# Patient Record
Sex: Male | Born: 1935
Health system: Southern US, Community
[De-identification: ages and names within clinical notes are randomized; demographics above are authoritative.]

## PROBLEM LIST (undated history)

## (undated) DIAGNOSIS — R972 Elevated prostate specific antigen [PSA]: Secondary | ICD-10-CM

## (undated) DIAGNOSIS — K5730 Diverticulosis of large intestine without perforation or abscess without bleeding: Secondary | ICD-10-CM

## (undated) DIAGNOSIS — N189 Chronic kidney disease, unspecified: Secondary | ICD-10-CM

## (undated) DIAGNOSIS — Z860100 Personal history of colon polyps, unspecified: Secondary | ICD-10-CM

## (undated) DIAGNOSIS — Z87442 Personal history of urinary calculi: Secondary | ICD-10-CM

## (undated) DIAGNOSIS — E785 Hyperlipidemia, unspecified: Secondary | ICD-10-CM

## (undated) DIAGNOSIS — C4491 Basal cell carcinoma of skin, unspecified: Secondary | ICD-10-CM

## (undated) DIAGNOSIS — I10 Essential (primary) hypertension: Secondary | ICD-10-CM

## (undated) DIAGNOSIS — C439 Malignant melanoma of skin, unspecified: Secondary | ICD-10-CM

## (undated) DIAGNOSIS — N400 Enlarged prostate without lower urinary tract symptoms: Secondary | ICD-10-CM

## (undated) HISTORY — PX: COLONOSCOPY W/ POLYPECTOMY: SHX1380

## (undated) HISTORY — DX: Personal history of colon polyps, unspecified: Z86.0100

## (undated) HISTORY — PX: OTHER SURGICAL HISTORY: SHX169

## (undated) HISTORY — DX: Malignant melanoma of skin, unspecified: C43.9

## (undated) HISTORY — DX: Hyperlipidemia, unspecified: E78.5

## (undated) HISTORY — DX: Personal history of colonic polyps: Z86.010

## (undated) HISTORY — DX: Elevated prostate specific antigen (PSA): R97.20

## (undated) HISTORY — DX: Diverticulosis of large intestine without perforation or abscess without bleeding: K57.30

## (undated) HISTORY — DX: Basal cell carcinoma of skin, unspecified: C44.91

## (undated) HISTORY — DX: Essential (primary) hypertension: I10

## (undated) HISTORY — DX: Benign prostatic hyperplasia without lower urinary tract symptoms: N40.0

---

## 1997-11-14 ENCOUNTER — Inpatient Hospital Stay (HOSPITAL_COMMUNITY): Admission: EM | Admit: 1997-11-14 | Discharge: 1997-11-16 | Payer: Self-pay | Admitting: *Deleted

## 1999-02-12 ENCOUNTER — Encounter: Payer: Self-pay | Admitting: Urology

## 1999-02-12 ENCOUNTER — Ambulatory Visit (HOSPITAL_COMMUNITY): Admission: RE | Admit: 1999-02-12 | Discharge: 1999-02-12 | Payer: Self-pay | Admitting: Urology

## 1999-02-12 ENCOUNTER — Encounter (INDEPENDENT_AMBULATORY_CARE_PROVIDER_SITE_OTHER): Payer: Self-pay | Admitting: Specialist

## 2000-07-28 ENCOUNTER — Encounter: Payer: Self-pay | Admitting: Oral Surgery

## 2000-08-01 ENCOUNTER — Encounter (INDEPENDENT_AMBULATORY_CARE_PROVIDER_SITE_OTHER): Payer: Self-pay | Admitting: *Deleted

## 2000-08-01 ENCOUNTER — Ambulatory Visit (HOSPITAL_COMMUNITY): Admission: RE | Admit: 2000-08-01 | Discharge: 2000-08-01 | Payer: Self-pay | Admitting: Oral Surgery

## 2000-09-18 ENCOUNTER — Ambulatory Visit (HOSPITAL_COMMUNITY): Admission: RE | Admit: 2000-09-18 | Discharge: 2000-09-18 | Payer: Self-pay | Admitting: Gastroenterology

## 2001-08-21 ENCOUNTER — Encounter: Payer: Self-pay | Admitting: Orthopedic Surgery

## 2001-08-21 ENCOUNTER — Encounter: Admission: RE | Admit: 2001-08-21 | Discharge: 2001-08-21 | Payer: Self-pay | Admitting: Orthopedic Surgery

## 2002-09-27 ENCOUNTER — Encounter: Admission: RE | Admit: 2002-09-27 | Discharge: 2002-09-27 | Payer: Self-pay | Admitting: Internal Medicine

## 2002-09-27 ENCOUNTER — Encounter: Payer: Self-pay | Admitting: Internal Medicine

## 2002-10-11 ENCOUNTER — Encounter: Payer: Self-pay | Admitting: Internal Medicine

## 2002-10-11 ENCOUNTER — Encounter: Admission: RE | Admit: 2002-10-11 | Discharge: 2002-10-11 | Payer: Self-pay | Admitting: Internal Medicine

## 2003-04-23 ENCOUNTER — Encounter: Payer: Self-pay | Admitting: Neurological Surgery

## 2003-04-23 ENCOUNTER — Ambulatory Visit (HOSPITAL_COMMUNITY): Admission: RE | Admit: 2003-04-23 | Discharge: 2003-04-23 | Payer: Self-pay | Admitting: Neurological Surgery

## 2003-05-12 ENCOUNTER — Encounter: Payer: Self-pay | Admitting: Diagnostic Radiology

## 2003-05-12 ENCOUNTER — Encounter: Admission: RE | Admit: 2003-05-12 | Discharge: 2003-05-12 | Payer: Self-pay | Admitting: Neurological Surgery

## 2003-05-12 ENCOUNTER — Encounter: Payer: Self-pay | Admitting: Neurological Surgery

## 2004-01-28 ENCOUNTER — Encounter: Admission: RE | Admit: 2004-01-28 | Discharge: 2004-01-28 | Payer: Self-pay | Admitting: Neurological Surgery

## 2004-03-01 ENCOUNTER — Encounter: Payer: Self-pay | Admitting: Internal Medicine

## 2004-03-04 ENCOUNTER — Encounter: Admission: RE | Admit: 2004-03-04 | Discharge: 2004-03-04 | Payer: Self-pay | Admitting: Neurological Surgery

## 2004-08-03 ENCOUNTER — Encounter: Admission: RE | Admit: 2004-08-03 | Discharge: 2004-08-03 | Payer: Self-pay | Admitting: Neurological Surgery

## 2004-10-18 ENCOUNTER — Ambulatory Visit: Payer: Self-pay | Admitting: Internal Medicine

## 2004-10-20 ENCOUNTER — Ambulatory Visit (HOSPITAL_COMMUNITY): Admission: RE | Admit: 2004-10-20 | Discharge: 2004-10-20 | Payer: Self-pay | Admitting: Neurological Surgery

## 2005-11-23 ENCOUNTER — Ambulatory Visit: Payer: Self-pay | Admitting: Internal Medicine

## 2006-02-28 ENCOUNTER — Encounter: Admission: RE | Admit: 2006-02-28 | Discharge: 2006-02-28 | Payer: Self-pay | Admitting: Neurological Surgery

## 2006-12-04 ENCOUNTER — Ambulatory Visit: Payer: Self-pay | Admitting: Internal Medicine

## 2006-12-04 LAB — CONVERTED CEMR LAB
AST: 21 units/L (ref 0–37)
BUN: 14 mg/dL (ref 6–23)
PSA: 2.25 ng/mL (ref 0.10–4.00)
Total CHOL/HDL Ratio: 3.1
Triglycerides: 75 mg/dL (ref 0–149)
VLDL: 15 mg/dL (ref 0–40)

## 2006-12-22 ENCOUNTER — Ambulatory Visit: Payer: Self-pay | Admitting: Internal Medicine

## 2007-03-19 ENCOUNTER — Telehealth (INDEPENDENT_AMBULATORY_CARE_PROVIDER_SITE_OTHER): Payer: Self-pay | Admitting: *Deleted

## 2007-03-26 ENCOUNTER — Ambulatory Visit: Payer: Self-pay | Admitting: Internal Medicine

## 2007-07-09 ENCOUNTER — Ambulatory Visit: Payer: Self-pay | Admitting: Internal Medicine

## 2007-07-10 ENCOUNTER — Encounter (INDEPENDENT_AMBULATORY_CARE_PROVIDER_SITE_OTHER): Payer: Self-pay | Admitting: *Deleted

## 2007-07-10 LAB — CONVERTED CEMR LAB
Amylase: 88 units/L (ref 27–131)
Basophils Absolute: 0 10*3/uL (ref 0.0–0.1)
Bilirubin, Direct: 0.1 mg/dL (ref 0.0–0.3)
Eosinophils Absolute: 0.2 10*3/uL (ref 0.0–0.6)
Eosinophils Relative: 2.3 % (ref 0.0–5.0)
Lipase: 33 units/L (ref 11.0–59.0)
MCV: 91.1 fL (ref 78.0–100.0)
Platelets: 207 10*3/uL (ref 150–400)
RBC: 4.6 M/uL (ref 4.22–5.81)
Total Bilirubin: 1 mg/dL (ref 0.3–1.2)
WBC: 6.8 10*3/uL (ref 4.5–10.5)

## 2007-07-17 ENCOUNTER — Encounter (INDEPENDENT_AMBULATORY_CARE_PROVIDER_SITE_OTHER): Payer: Self-pay | Admitting: *Deleted

## 2007-07-17 ENCOUNTER — Ambulatory Visit: Payer: Self-pay | Admitting: Internal Medicine

## 2007-07-19 ENCOUNTER — Telehealth (INDEPENDENT_AMBULATORY_CARE_PROVIDER_SITE_OTHER): Payer: Self-pay | Admitting: *Deleted

## 2007-08-02 ENCOUNTER — Encounter: Payer: Self-pay | Admitting: Internal Medicine

## 2007-11-09 ENCOUNTER — Encounter: Payer: Self-pay | Admitting: Internal Medicine

## 2007-11-16 ENCOUNTER — Encounter: Admission: RE | Admit: 2007-11-16 | Discharge: 2007-11-16 | Payer: Self-pay | Admitting: Gastroenterology

## 2007-11-19 ENCOUNTER — Encounter: Payer: Self-pay | Admitting: Internal Medicine

## 2008-05-19 ENCOUNTER — Telehealth (INDEPENDENT_AMBULATORY_CARE_PROVIDER_SITE_OTHER): Payer: Self-pay | Admitting: *Deleted

## 2008-05-27 ENCOUNTER — Telehealth (INDEPENDENT_AMBULATORY_CARE_PROVIDER_SITE_OTHER): Payer: Self-pay | Admitting: *Deleted

## 2008-06-02 ENCOUNTER — Ambulatory Visit: Payer: Self-pay | Admitting: Internal Medicine

## 2008-06-02 DIAGNOSIS — N400 Enlarged prostate without lower urinary tract symptoms: Secondary | ICD-10-CM | POA: Insufficient documentation

## 2008-06-04 ENCOUNTER — Encounter (INDEPENDENT_AMBULATORY_CARE_PROVIDER_SITE_OTHER): Payer: Self-pay | Admitting: *Deleted

## 2008-09-01 ENCOUNTER — Ambulatory Visit: Payer: Self-pay | Admitting: Internal Medicine

## 2008-09-07 LAB — CONVERTED CEMR LAB: PSA: 1.6 ng/mL (ref 0.10–4.00)

## 2008-09-08 ENCOUNTER — Ambulatory Visit: Payer: Self-pay | Admitting: Internal Medicine

## 2008-09-08 ENCOUNTER — Encounter (INDEPENDENT_AMBULATORY_CARE_PROVIDER_SITE_OTHER): Payer: Self-pay | Admitting: *Deleted

## 2008-09-08 DIAGNOSIS — R972 Elevated prostate specific antigen [PSA]: Secondary | ICD-10-CM | POA: Insufficient documentation

## 2008-09-09 ENCOUNTER — Ambulatory Visit: Payer: Self-pay | Admitting: Internal Medicine

## 2008-09-10 ENCOUNTER — Encounter (INDEPENDENT_AMBULATORY_CARE_PROVIDER_SITE_OTHER): Payer: Self-pay | Admitting: *Deleted

## 2008-10-02 ENCOUNTER — Ambulatory Visit: Payer: Self-pay | Admitting: Internal Medicine

## 2008-10-02 DIAGNOSIS — E785 Hyperlipidemia, unspecified: Secondary | ICD-10-CM

## 2008-10-02 DIAGNOSIS — Z85828 Personal history of other malignant neoplasm of skin: Secondary | ICD-10-CM

## 2008-10-02 DIAGNOSIS — IMO0002 Reserved for concepts with insufficient information to code with codable children: Secondary | ICD-10-CM | POA: Insufficient documentation

## 2008-10-02 DIAGNOSIS — H532 Diplopia: Secondary | ICD-10-CM

## 2008-10-02 DIAGNOSIS — K5730 Diverticulosis of large intestine without perforation or abscess without bleeding: Secondary | ICD-10-CM | POA: Insufficient documentation

## 2008-10-02 DIAGNOSIS — Z86010 Personal history of colonic polyps: Secondary | ICD-10-CM

## 2008-10-02 DIAGNOSIS — Z85820 Personal history of malignant melanoma of skin: Secondary | ICD-10-CM

## 2008-10-02 DIAGNOSIS — G250 Essential tremor: Secondary | ICD-10-CM | POA: Insufficient documentation

## 2008-10-03 ENCOUNTER — Encounter: Payer: Self-pay | Admitting: Internal Medicine

## 2008-10-08 ENCOUNTER — Telehealth: Payer: Self-pay | Admitting: Internal Medicine

## 2008-10-10 ENCOUNTER — Ambulatory Visit: Payer: Self-pay | Admitting: Internal Medicine

## 2008-10-13 ENCOUNTER — Telehealth (INDEPENDENT_AMBULATORY_CARE_PROVIDER_SITE_OTHER): Payer: Self-pay | Admitting: *Deleted

## 2008-10-13 ENCOUNTER — Encounter (INDEPENDENT_AMBULATORY_CARE_PROVIDER_SITE_OTHER): Payer: Self-pay | Admitting: *Deleted

## 2008-10-14 ENCOUNTER — Telehealth (INDEPENDENT_AMBULATORY_CARE_PROVIDER_SITE_OTHER): Payer: Self-pay | Admitting: *Deleted

## 2008-10-23 ENCOUNTER — Ambulatory Visit: Payer: Self-pay | Admitting: Internal Medicine

## 2008-10-23 DIAGNOSIS — R7303 Prediabetes: Secondary | ICD-10-CM

## 2008-11-18 ENCOUNTER — Ambulatory Visit: Payer: Self-pay | Admitting: Internal Medicine

## 2008-11-19 ENCOUNTER — Encounter (INDEPENDENT_AMBULATORY_CARE_PROVIDER_SITE_OTHER): Payer: Self-pay | Admitting: *Deleted

## 2008-12-01 ENCOUNTER — Ambulatory Visit: Payer: Self-pay | Admitting: Internal Medicine

## 2008-12-03 ENCOUNTER — Encounter (INDEPENDENT_AMBULATORY_CARE_PROVIDER_SITE_OTHER): Payer: Self-pay | Admitting: *Deleted

## 2008-12-03 LAB — CONVERTED CEMR LAB: TSH: 2.3 microintl units/mL (ref 0.35–5.50)

## 2008-12-08 ENCOUNTER — Encounter (INDEPENDENT_AMBULATORY_CARE_PROVIDER_SITE_OTHER): Payer: Self-pay | Admitting: *Deleted

## 2008-12-09 ENCOUNTER — Telehealth (INDEPENDENT_AMBULATORY_CARE_PROVIDER_SITE_OTHER): Payer: Self-pay | Admitting: *Deleted

## 2009-01-16 ENCOUNTER — Telehealth (INDEPENDENT_AMBULATORY_CARE_PROVIDER_SITE_OTHER): Payer: Self-pay | Admitting: *Deleted

## 2009-02-24 ENCOUNTER — Ambulatory Visit: Payer: Self-pay | Admitting: Internal Medicine

## 2009-03-31 ENCOUNTER — Telehealth (INDEPENDENT_AMBULATORY_CARE_PROVIDER_SITE_OTHER): Payer: Self-pay | Admitting: *Deleted

## 2009-05-15 ENCOUNTER — Telehealth (INDEPENDENT_AMBULATORY_CARE_PROVIDER_SITE_OTHER): Payer: Self-pay | Admitting: *Deleted

## 2009-06-11 ENCOUNTER — Telehealth (INDEPENDENT_AMBULATORY_CARE_PROVIDER_SITE_OTHER): Payer: Self-pay | Admitting: *Deleted

## 2009-07-06 ENCOUNTER — Ambulatory Visit: Payer: Self-pay | Admitting: Internal Medicine

## 2009-07-06 DIAGNOSIS — I10 Essential (primary) hypertension: Secondary | ICD-10-CM | POA: Insufficient documentation

## 2009-07-06 LAB — CONVERTED CEMR LAB: Cholesterol, target level: 200 mg/dL

## 2009-08-26 ENCOUNTER — Telehealth (INDEPENDENT_AMBULATORY_CARE_PROVIDER_SITE_OTHER): Payer: Self-pay | Admitting: *Deleted

## 2009-12-22 ENCOUNTER — Ambulatory Visit: Payer: Self-pay | Admitting: Internal Medicine

## 2009-12-22 LAB — CONVERTED CEMR LAB
Albumin: 3.9 g/dL (ref 3.5–5.2)
Alkaline Phosphatase: 51 units/L (ref 39–117)
Basophils Relative: 0.7 % (ref 0.0–3.0)
Bilirubin Urine: NEGATIVE
CO2: 29 meq/L (ref 19–32)
Chloride: 109 meq/L (ref 96–112)
Eosinophils Relative: 2.4 % (ref 0.0–5.0)
Glucose, Bld: 98 mg/dL (ref 70–99)
HCT: 39.2 % (ref 39.0–52.0)
Hemoglobin: 13.4 g/dL (ref 13.0–17.0)
Ketones, urine, test strip: NEGATIVE
Monocytes Relative: 8.8 % (ref 3.0–12.0)
Neutrophils Relative %: 53.6 % (ref 43.0–77.0)
Potassium: 4.5 meq/L (ref 3.5–5.1)
Protein, U semiquant: NEGATIVE
RBC: 4.31 M/uL (ref 4.22–5.81)
Sodium: 143 meq/L (ref 135–145)
TSH: 2.88 microintl units/mL (ref 0.35–5.50)
Total Protein: 6.2 g/dL (ref 6.0–8.3)
Urobilinogen, UA: 0.2
WBC: 5.2 10*3/uL (ref 4.5–10.5)

## 2009-12-28 ENCOUNTER — Ambulatory Visit: Payer: Self-pay | Admitting: Internal Medicine

## 2009-12-28 DIAGNOSIS — R351 Nocturia: Secondary | ICD-10-CM | POA: Insufficient documentation

## 2010-01-01 LAB — CONVERTED CEMR LAB
LDL Cholesterol: 98 mg/dL (ref 0–99)
Total CHOL/HDL Ratio: 3
Triglycerides: 62 mg/dL (ref 0.0–149.0)
VLDL: 12.4 mg/dL (ref 0.0–40.0)

## 2010-01-27 ENCOUNTER — Encounter: Payer: Self-pay | Admitting: Internal Medicine

## 2010-02-04 ENCOUNTER — Encounter (INDEPENDENT_AMBULATORY_CARE_PROVIDER_SITE_OTHER): Payer: Self-pay | Admitting: *Deleted

## 2010-02-04 ENCOUNTER — Ambulatory Visit: Payer: Self-pay | Admitting: Internal Medicine

## 2010-02-04 LAB — CONVERTED CEMR LAB
OCCULT 1: NEGATIVE
OCCULT 2: NEGATIVE
OCCULT 3: NEGATIVE

## 2010-03-19 ENCOUNTER — Telehealth (INDEPENDENT_AMBULATORY_CARE_PROVIDER_SITE_OTHER): Payer: Self-pay | Admitting: *Deleted

## 2010-06-01 ENCOUNTER — Encounter: Admission: RE | Admit: 2010-06-01 | Discharge: 2010-06-01 | Payer: Self-pay | Admitting: Gastroenterology

## 2010-06-01 ENCOUNTER — Encounter: Payer: Self-pay | Admitting: Internal Medicine

## 2010-06-10 ENCOUNTER — Encounter: Payer: Self-pay | Admitting: Internal Medicine

## 2010-06-11 ENCOUNTER — Encounter: Payer: Self-pay | Admitting: Internal Medicine

## 2010-06-29 ENCOUNTER — Ambulatory Visit: Payer: Self-pay | Admitting: Cardiology

## 2010-09-14 NOTE — Progress Notes (Signed)
Summary: refill  Phone Note Refill Request Message from:  Fax from Pharmacy on March 19, 2010 3:27 PM  Refills Requested: Medication #1:  FINASTERIDE 5 MG TABS 1 qd [facility] - fax [phone]  Initial call taken by: Okey Regal [place],  March 19, 2010 3:29 PM    Prescriptions: FINASTERIDE 5 MG TABS (FINASTERIDE) 1 qd  #90 x 1   Entered by:   Shonna Chock CMA   Authorized by:   Marga Melnick MD   Signed by:   Shonna Chock CMA on 03/19/2010   Method used:   Printed then faxed to ...         RxID:   [id number]

## 2010-09-14 NOTE — Procedures (Signed)
Summary: Colon[facility]  [facility]   Imported By: Lanelle Bal 07/05/2010 13:35:13  _____________________________________________________________________  External Attachment:    Type:   Image     Comment:   External Document

## 2010-09-14 NOTE — Consult Note (Signed)
Summary: [facility]  [facility]   Imported By: Lanelle Bal 06/29/2010 11:59:52  _____________________________________________________________________  External Attachment:    Type:   Image     Comment:   External Document

## 2010-09-14 NOTE — Consult Note (Signed)
Summary: [facility]  [facility]   Imported By: Lanelle Bal 02/11/2010 13:56:20  _____________________________________________________________________  External Attachment:    Type:   Image     Comment:   External Document

## 2010-09-14 NOTE — Assessment & Plan Note (Signed)
Summary: CPX,LABS PRIOR,MEDICARE & BCBS/RH........Marland Kitchen   Vital Signs:  Patient profile:   77 year old male Height:      67 inches Weight:      150.8 pounds BMI:     23.70 Temp:     98.3 degrees F oral Pulse rate:   60 / minute Resp:     16 per minute BP sitting:   128 / 70  (left arm) Cuff size:   regular  Vitals Entered By: Shonna Chock (Dec 28, 2009 2:31 PM) CC: CPX and discuss labs (copy given), Patient had cereal at 7:30am and has not eaten since if additional labs needed Comments REVIEWED MED LIST, PATIENT AGREED DOSE AND INSTRUCTION CORRECT    CC:  CPX and discuss labs (copy given) and Patient had cereal at 7:30am and has not eaten since if additional labs needed.  History of Present Illness: Roger Ingram is here for a Medicare Wellness Exam; he is essentially asymptomatic. AWV protocol reviewed; ALL were negative except no flu shot in Fall 2010 & no Living Will to date.Risks reviewed in PMH & ROS.He sees Dr Terri Piedra every 6 months because of skin cancer history.  Preventive Screening-Counseling & Management  Caffeine-Diet-Exercise     Does Patient Exercise: yes  Allergies: 1)  ! * Ct Dye 2)  * Bextra 3)  Hydrocodone  Past History:  Past Medical History: Skin cancer, PMH  of,  melanoma L forehead 2009, Dr Terri Piedra; Basal Cell 2010 Elevated PSA, PMH of  Colonic polyps, hx of Diverticulosis, colon Hyperlipidemia Benign prostatic hypertrophy Hypertension  Past Surgical History: Colon polypectomy 2001; extensive diverticulosis 2006, Dr Kinnie Scales Melanoma L forehead; Basal Cell R temple  Family History: Father: MI in 70s,CVA Mother: CVA Siblings:  bro colon cancer; PGF CVA  Social History: No diet Retired Married Never Smoked Alcohol use-yes: minimally Regular exercise-yes: walking , farming  Review of Systems  The patient denies anorexia, fever, weight loss, weight gain, vision loss, hoarseness, chest pain, syncope, dyspnea on exertion, peripheral edema,  prolonged cough, headaches, hemoptysis, melena, hematochezia, severe indigestion/heartburn, depression, unusual weight change, abnormal bleeding, enlarged lymph nodes, and angioedema.         ? decreased hearing as per wife. Intermittent diverticular flares.Nocturia @ least 4X/ night  Physical Exam  General:  Appears younger than age,well-nourished; alert,appropriate and cooperative throughout examination Head:  Normocephalic and atraumatic without obvious abnormalities. No apparent alopecia or balding. Eyes:  No corneal or conjunctival inflammation noted.  Perrla. Funduscopic exam benign, without hemorrhages, exudates or papilledema.  Ears:  External ear exam shows no significant lesions or deformities.  Otoscopic examination reveals clear canals, tympanic membranes are intact bilaterally without bulging, retraction, inflammation or discharge. Hearing is grossly normal bilaterally. Nose:  External nasal examination shows no deformity or inflammation. Nasal mucosa are pink and moist without lesions or exudates. Mouth:  Oral mucosa and oropharynx without lesions or exudates.  Teeth in good repair. Neck:  No deformities, masses, or tenderness noted. Lungs:  Normal respiratory effort, chest expands symmetrically. Lungs are clear to auscultation, no crackles or wheezes. Heart:  Normal rate and regular rhythm. S1 and S2 normal without gallop, murmur, click, rub.Loud S4 Abdomen:  Bowel sounds positive,abdomen soft and non-tender without masses, organomegaly or hernias noted. Rectal:  No external abnormalities noted. Normal sphincter tone. No rectal masses or tenderness. Genitalia:  Testes bilaterally descended without nodularity, tenderness or masses. Some atrophy  bilaterally. No scrotal masses or lesions. No penis lesions or urethral discharge. Prostate:  1+ enlarged;  slightly irregular on R.   Msk:  No deformity or scoliosis noted of thoracic or lumbar spine.   Pulses:  R and L  carotid,radial,dorsalis pedis and posterior tibial pulses are full and equal bilaterally Extremities:  No clubbing, cyanosis, edema, or deformity noted with normal full range of motion of all joints.   Neurologic:  alert & oriented X3 and DTRs symmetrical and normal.   Skin:  Intact without suspicious lesions or rashes. Solar changes Cervical Nodes:  No lymphadenopathy noted Axillary Nodes:  No palpable lymphadenopathy Inguinal Nodes:  No significant adenopathy Psych:  memory intact for recent and remote, normally interactive, and good eye contact.     Impression & Recommendations:  Problem # 1:  PREVENTIVE HEALTH CARE (ICD-V70.0)  Orders: Subsequent annual wellness visit with prevention plan (Z6109)  Problem # 2:  HYPERTENSION (ICD-401.9)  controlled ; no change His updated medication list for this problem includes:    Lotrel 10-20 Mg Caps (Amlodipine besy-benazepril hcl) .Marland Kitchen... 1 capsule daily  Orders: Subsequent annual wellness visit with prevention plan (U0454) EKG w/ Interpretation (93000)  Problem # 3:  BENIGN PROSTATIC HYPERTROPHY (ICD-600.00) Slight asymmetry His updated medication list for this problem includes:    Flomax 0.4 Mg Cp24 (Tamsulosin hcl) .Marland Kitchen... 1 by mouth qd    Finasteride 5 Mg Tabs (Finasteride) .Marland Kitchen... 1 qd  Problem # 4:  MALIGNANT MELANOMA, HX OF (ICD-V10.82) At least annual Derm exam  Problem # 5:  HYPERLIPIDEMIA (ICD-272.4)  Orders: Subsequent annual wellness visit with prevention plan (U9811) TLB-Lipid Panel (80061-LIPID)  Problem # 6:  ELEVATED PROSTATE SPECIFIC ANTIGEN (ICD-790.93)  Improved on Finasteride  Orders: Subsequent annual wellness visit with prevention plan (B1478) Urology Referral (Urology)  Problem # 7:  NOCTURIA (GNF-621.30)  Orders: Subsequent annual wellness visit with prevention plan (Q6578) Urology Referral (Urology)  Complete Medication List: 1)  Lotrel 10-20 Mg Caps (Amlodipine besy-benazepril hcl) .Marland Kitchen.. 1 capsule  daily 2)  Flomax 0.4 Mg Cp24 (Tamsulosin hcl) .Marland Kitchen.. 1 by mouth qd 3)  Folic Acid833mcg  4)  Asa 81mg   5)  Finasteride 5 Mg Tabs (Finasteride) .Marland Kitchen.. 1 qd 6)  Gabapentin 100 Mg Caps (Gabapentin) .Marland Kitchen.. 1-3 at bedtime  Patient Instructions: 1)  Consider Flu shot annually.Take an Aspirin every day.Choose your Health care Power of Attorney and/or prepare a Living Will. Prescriptions: LOTREL 10-20 MG  CAPS (AMLODIPINE BESY-BENAZEPRIL HCL) 1 capsule daily  #90 x 3   Entered and Authorized by:   Marga Melnick MD   Signed by:   Marga Melnick MD on 12/28/2009   Method used:   Print then Give to Patient   RxID:   [id number]

## 2010-09-14 NOTE — Letter (Signed)
Summary: Results Follow up Letter  [facility]  [address] [place], [place] [address]   Phone: [phone]  Fax: [phone]    02/04/2010 MRN: [id number]  NATHANAL HERMIZ [address] [place], [place]  [address]  Dear Mr. Marcoe,  The following are the results of your recent test(s):  Test         Result    Pap Smear:        Normal _____  Not Normal _____ Comments: ______________________________________________________ Cholesterol: LDL(Bad cholesterol):         Your goal is less than:         HDL (Good cholesterol):       Your goal is more than: Comments:  ______________________________________________________ Mammogram:        Normal _____  Not Normal _____ Comments:  ___________________________________________________________________ Hemoccult:        Normal __X___  Not normal _______ Comments:    _____________________________________________________________________ Other Tests:    We routinely do not discuss normal results over the telephone.  If you desire a copy of the results, or you have any questions about this information we can discuss them at your next office visit.   Sincerely,

## 2010-09-14 NOTE — Letter (Signed)
[facility]: [facility]   Imported By: Lanelle Bal 06/16/2010 12:00:41  _____________________________________________________________________  External Attachment:    Type:   Image     Comment:   External Document

## 2010-09-14 NOTE — Progress Notes (Signed)
Summary: REFILL  Phone Note Refill Request Message from:  Fax from Pharmacy on [facility] FAX [phone]  Refills Requested: Medication #1:  FINASTERIDE 5 MG TABS 1 qd Initial call taken by: Barb Merino,  August 26, 2009 12:24 PM    Prescriptions: FINASTERIDE 5 MG TABS (FINASTERIDE) 1 qd  #90 x 1   Entered by:   Shonna Chock   Authorized by:   Marga Melnick MD   Signed by:   Shonna Chock on 08/26/2009   Method used:   Printed then faxed to ...         RxID:   [id number]

## 2010-10-12 ENCOUNTER — Other Ambulatory Visit (INDEPENDENT_AMBULATORY_CARE_PROVIDER_SITE_OTHER): Payer: Self-pay

## 2010-10-12 DIAGNOSIS — R0989 Other specified symptoms and signs involving the circulatory and respiratory systems: Secondary | ICD-10-CM

## 2010-12-06 ENCOUNTER — Other Ambulatory Visit: Payer: Self-pay | Admitting: Internal Medicine

## 2010-12-31 NOTE — Op Note (Signed)
[place]  Patient:    Roger Ingram, Roger Ingram                        MRN: [id number] Proc. Date: 08/01/00 Adm. Date:  [id number] Attending:  Lovena Le                           Operative Report  PREOPERATIVE DIAGNOSIS:  Submandibular sialolithiasis.  POSTOPERATIVE DIAGNOSIS:  Submandibular sialolithiasis.  OPERATION PERFORMED:  Excision of right submandibular gland and associated sialolith.  SURGEON:  Lovena Le, M.D.  ASSISTANT:  Saddie Benders, D.D.S.  ANESTHESIA:  General via orotracheal intubation.  INDICATIONS FOR PROCEDURE:  Roger Ingram is a 77 year old male who complained of recent intermittent right facial and cervical swelling with pain after clinical and radiographic examination and diagnosis was made of a large right submandibular sialolith approximately 2.2 cm in diameter, necessitating excision of the stone and the associated gland.  DESCRIPTION OF PROCEDURE:  The patient was brought to the operating room in satisfactory preoperative condition and placed on the operating room table in supine position.  Following successful induction of general anesthesia via orotracheal intubation, the patient was prepped and draped in the usual sterile fashion for a procedure of this type.  Initially, approximately 4 cc of a 0.5% Marcaine solution with 1:200,000 epinephrine was infiltrated into the right submandibular region approximately 3 cm inferior to the border of the mandible.  After allowing adequate time for hemostasis and anesthesia, a skin incision was then created in a normal cervical skin crease.  The incision extended for a length of approximately 5 cm. The incision was carried down through skin and subcutaneous tissues.  Small vessels were cauterized with the Bovie.  Next, the platysma was then divided with sharp dissection.  It should be noted that no contact was made with the marginal mandibular branch of the facial  nerve in the proceeding dissection.  Next, blunt dissection was carried out with a hemostat and various clamps.  The substance of the gland entered into the field.  The gland dissection continued superiorly and medially until the associated submandibular stone was identified.  The gland was further dissected from the underlying tissues.  The digastric sling was identified at the inferior aspect of the field.  The hyoglossus muscle was identified medial to the gland.  As the dissection proceeded superiorly, the submandibular duct was identified at the point where it exits from the gland and the relationship to the lingual nerve was also noted.  Several branches of the facial vein and artery were then ligated and divided.  Once the gland was dissected free of the surrounding tissues, the submandibular duct was then dilated and divided. It should be noted that the lingual nerve was noted to be intact at this time. The gland and associated stone was then submitted for pathology in formalin. The site was then thoroughly irrigated and hemostasis was noted to exist. This site was then closed in a layered fashion.  The deep layers were closed with 3-0 Vicryl subcutaneous closure was obtained with 4-0 Vicryl and the skin  was closed with 5-0 Prolene stitch.  The incision was then dressed with benzoin, Steri-Strips and a pressure dressing.  This essentially concluded the procedure.  All sponge, needle and instrument counts were correct at the conclusion of the procedure.  The patient was extubated and transported to the PACU in satisfactory condition. DD:  08/01/00 TD:  08/01/00 Job: [id number]

## 2010-12-31 NOTE — Assessment & Plan Note (Signed)
[facility]                        [place] OFFICE NOTE   Roger Ingram                        MRN:          [id number]  DATE:12/04/2006                            DOB:          [birth year]    Roger Ingram was seen for medication refill on December 04, 2006.   This most active problem regards his lumbar radiculopathy for which he  has been followed by Dr. Danielle Dess.  He has had at least six epidurals; and  he had three over a 90-day period.  He is no calcium or vitamin D.  At  present the sciatica is stable.  He denies any paresthesias of the  groin area and denies any constitutional symptoms.  His hypertension is  well controlled with no active cardiopulmonary symptoms.   His chief concern is a tremor in the right upper extremity.  He must  print rather than attempt script; this is a classic intention tremor by  history.   There is no change in his past medical history.  He had salivary gland  resection for stone.  He has had colon polyps and diverticulosis found @  colonoscopy.  He has had an elevated PSA in the past for which he was  seen by Dr. Aldean Ast .   His brother had colon cancer, son diabetes.  Father had a stroke, heart  attack, hypertension.  Maternal grandfather had heart attack and stroke;  and mother heart disease.   REVIEW OF SYSTEMS:  He has Addison's disease.   He rarely drinks and has never smoked.   MEDICATIONS:  Presently he is on aspirin 325 mg daily, folic acid,  Lotrel 10/20, Toprol 100 mg one-half daily, and Flomax 0.4.   ALLERGIES:  He is intolerant or allergic to CT dye and HYDROCODONE.   PHYSICAL EXAMINATION:  Weight is stable at 152.  He is afebrile. Pulse  is 70.  Respiratory rate is 15.  Blood pressure 127/64.  He does have  arteriolar narrowing.  The cranial nerve exam was unremarkable.  He has no carotid bruits.  An S4 is noted.  CHEST:  Clear to auscultation.  He has no organomegaly or masses  or  lymphadenopathy.  Deep tendon reflexes are normal.  He does have an intention tremor most  obvious in the right hand.  His wife was concerned about memory loss.  His Mini Mental Status Exam  revealed a score of 28.  The only deficits  involved  math and spelling.  The thyroid is within normal limits, slightly asymmetric.  Prostate is enlarged with the right lobe greater than the left.  Hemoccult testing is negative.  Straight leg raising is normal as are deep tendon reflexes.   Because of the multiple epidurals, a bone  mineral density study should  be considered.   Because of the tremor, propranolol will be initiated and patient will be  taken off metoprolol.  This non selective Beta blocker may be of greater  benefit in treating the intention tremor.  If the symptoms persist  Neurology consultation would be pursued.   Further recommendations will be based  on the lab results.     Roger Ingram. Roger Ren, MD,FACP,FCCP  Electronically Signed    [facility]  DD: 12/04/2006  DT: 12/04/2006  Job #: [id number]

## 2011-01-12 ENCOUNTER — Other Ambulatory Visit: Payer: Self-pay | Admitting: Internal Medicine

## 2011-01-13 MED ORDER — FINASTERIDE 5 MG PO TABS: 5.0000 mg | ORAL_TABLET | Freq: Every day | ORAL | Status: DC

## 2011-01-13 MED ORDER — AMLODIPINE BESY-BENAZEPRIL HCL 10-20 MG PO CAPS: 1.0000 | ORAL_CAPSULE | Freq: Every day | ORAL | Status: DC

## 2011-01-13 NOTE — Telephone Encounter (Signed)
RX sent to pharmacy on file [facility] [address]

## 2011-01-13 NOTE — Telephone Encounter (Deleted)
Please clarify pharmacy. Unable to locate [facility] with 1-800 number

## 2011-01-20 ENCOUNTER — Telehealth: Payer: Self-pay | Admitting: Internal Medicine

## 2011-01-20 NOTE — Telephone Encounter (Signed)
Please clarify pharmacy, [facility] mail order is not a valid option, ? [facility], [facility], [facility], [facility] . . . . . . . . Marland Kitchen ect

## 2011-01-20 NOTE — Telephone Encounter (Signed)
Patient needs refill finasteride 5mg  - [facility] / [facility] mail [facility] - he said pharmacy told him it was denied - he has appt for cpx [phone]

## 2011-01-24 MED ORDER — AMLODIPINE BESY-BENAZEPRIL HCL 10-20 MG PO CAPS: 1.0000 | ORAL_CAPSULE | Freq: Every day | ORAL | Status: DC

## 2011-01-24 MED ORDER — FINASTERIDE 5 MG PO TABS: 5.0000 mg | ORAL_TABLET | Freq: Every day | ORAL | Status: DC

## 2011-01-24 NOTE — Telephone Encounter (Signed)
Patient called back rx bottle says [facility]  Or [facility]

## 2011-01-26 MED ORDER — TAMSULOSIN HCL 0.4 MG PO CAPS
0.4000 mg | ORAL_CAPSULE | Freq: Every day | ORAL | Status: DC
Start: 2011-01-26 — End: 2011-01-28

## 2011-01-26 MED ORDER — FINASTERIDE 5 MG PO TABS: 5.0000 mg | ORAL_TABLET | Freq: Every day | ORAL | Status: DC

## 2011-01-26 MED ORDER — AMLODIPINE BESY-BENAZEPRIL HCL 10-20 MG PO CAPS: 1.0000 | ORAL_CAPSULE | Freq: Every day | ORAL | Status: DC

## 2011-01-26 NOTE — Telephone Encounter (Signed)
Addended by: Edgardo Roys on: 01/26/2011 09:07 AM   Modules accepted: Orders

## 2011-01-26 NOTE — Telephone Encounter (Signed)
[facility] sent to [facility] with not to overnight meds per patient's request

## 2011-01-28 ENCOUNTER — Telehealth: Payer: Self-pay | Admitting: Internal Medicine

## 2011-01-28 MED ORDER — AMLODIPINE BESY-BENAZEPRIL HCL 10-20 MG PO CAPS: 1.0000 | ORAL_CAPSULE | Freq: Every day | ORAL | Status: DC

## 2011-01-28 MED ORDER — FINASTERIDE 5 MG PO TABS: 5.0000 mg | ORAL_TABLET | Freq: Every day | ORAL | Status: DC

## 2011-01-28 MED ORDER — TAMSULOSIN HCL 0.4 MG PO CAPS: 0.4000 mg | ORAL_CAPSULE | Freq: Every day | ORAL | Status: DC

## 2011-01-28 NOTE — Telephone Encounter (Signed)
Patient called back and indicated he gave incorrect pharmacy and rx should be sent to [facility] home delivery.  The pharmacy called and I ok'd rx for #90 with no refills

## 2011-01-28 NOTE — Telephone Encounter (Signed)
Called [facility] and cancelled rx's

## 2011-01-28 NOTE — Telephone Encounter (Signed)
Patient wants 30 day supply lotrel called in to [facility] [address]  --- mail order didn't arrive

## 2011-01-28 NOTE — Telephone Encounter (Signed)
Addended by: Edgardo Roys on: 01/28/2011 04:13 PM   Modules accepted: Orders

## 2011-01-28 NOTE — Telephone Encounter (Signed)
RX sent to pharmacy  

## 2011-01-31 MED ORDER — FINASTERIDE 5 MG PO TABS: 5.0000 mg | ORAL_TABLET | Freq: Every day | ORAL | Status: DC

## 2011-01-31 NOTE — Telephone Encounter (Signed)
Addended by: Edgardo Roys on: 01/31/2011 03:51 PM   Modules accepted: Orders

## 2011-01-31 NOTE — Telephone Encounter (Signed)
Patient called back and spoke with scheduler Maralyn Sago) and indicated he called [facility] and they have not heard anything from [place] and he needs is rx's.   I called [facility] home [facility]( Spoke with Kathlene November) and they indicated they spoke with patient and told him that rx's are in process (not that they had not heard from [place])   I called patient back left  Message on voicemail discussing conversation with pharmacy. 30 day rx for finasteride sent to the pharmacy ([facility] [address] [facility]) to hold patient over until 90 day supply arrives

## 2011-02-19 ENCOUNTER — Encounter: Payer: Self-pay | Admitting: Internal Medicine

## 2011-02-22 ENCOUNTER — Encounter: Payer: Self-pay | Admitting: Internal Medicine

## 2011-02-22 ENCOUNTER — Ambulatory Visit (INDEPENDENT_AMBULATORY_CARE_PROVIDER_SITE_OTHER): Payer: Medicare Other | Admitting: Internal Medicine

## 2011-02-22 VITALS — BP 112/70 | HR 61 | Temp 98.2°F | Resp 12 | Ht 68.0 in | Wt 145.0 lb

## 2011-02-22 DIAGNOSIS — N401 Enlarged prostate with lower urinary tract symptoms: Secondary | ICD-10-CM

## 2011-02-22 DIAGNOSIS — I10 Essential (primary) hypertension: Secondary | ICD-10-CM

## 2011-02-22 DIAGNOSIS — Z136 Encounter for screening for cardiovascular disorders: Secondary | ICD-10-CM

## 2011-02-22 DIAGNOSIS — Z0000 Encounter for general adult medical examination without abnormal findings: Secondary | ICD-10-CM

## 2011-02-22 DIAGNOSIS — N138 Other obstructive and reflux uropathy: Secondary | ICD-10-CM

## 2011-02-22 DIAGNOSIS — Z860100 Personal history of colon polyps, unspecified: Secondary | ICD-10-CM

## 2011-02-22 DIAGNOSIS — E785 Hyperlipidemia, unspecified: Secondary | ICD-10-CM

## 2011-02-22 DIAGNOSIS — Z85820 Personal history of malignant melanoma of skin: Secondary | ICD-10-CM

## 2011-02-22 DIAGNOSIS — H532 Diplopia: Secondary | ICD-10-CM

## 2011-02-22 DIAGNOSIS — Z85828 Personal history of other malignant neoplasm of skin: Secondary | ICD-10-CM

## 2011-02-22 NOTE — Patient Instructions (Signed)
Preventive Health Care: Exercise at least 30-45 minutes a day,  3-4 days a week.  Eat a low-fat diet with lots of fruits and vegetables, up to 7-9 servings per day. Consume less than 40 grams of sugar per day from foods & drinks with High Fructose Corn Sugar as #2,3 or # 4 on label. Health Care Power of Attorney & Living Will. Complete if not in place ; these place you in charge of your health care decisions. 

## 2011-02-22 NOTE — Progress Notes (Signed)
Subjective:    Patient ID: Roger Ingram, male    DOB: 1929/08/14, 77 y.o.   MRN: [id number]  HPI Medicare Wellness Visit:  The following psychosocial & medical history were reviewed as required by Medicare.   Social history: caffeine: 2 cups coffee , alcohol:  no ,  tobacco use : never  & exercise : walking 3 mpd.   Home & personal  safety / fall risk: only @ night going to [place], activities of daily living: no limitations , seatbelt use : yes , and smoke alarm employment : yes .  Power of Attorney/Living Will status : no Living Will  Vision ( as recorded per Nurse) & Hearing  evaluation :  Decreased hearing R ear; wall chart read with  lenses. Orientation :oriented X 3 , memory & recall :2 of 3 recalled , spelling or math testing: normal,and mood & affect : normal  . Depression / anxiety: denied Travel history : 77s [place] & [place] , immunization status :Shingles today , transfusion history:  no, and preventive health surveillance ( colonoscopies, BMD , etc as per protocol/ SOC): up to date, Dental care:  Seen irregularly (prn) . Chart reviewed &  Updated. Active issues reviewed & addressed.       Review of Systems Patient reports no significant  vision  changes,anorexia, fever ,adenopathy, persistant / recurrent hoarseness,  chest pain,palpitations, edema,persistant / recurrent cough, hemoptysis, dyspnea(rest, exertional, paroxysmal nocturnal), gastrointestinal  bleeding (melena, rectal bleeding), abdominal pain, excessive heart burn, GU symptoms( dysuria, hematuria, pyuria, voiding/incontinence  issues) syncope, focal weakness, memory loss,numbness & tingling, skin/hair/nail changes, or abnormal bruising/bleeding. S/P injection L hip 05/12 by Dr Despina Hick with resolution of pain . He occasionally has cough after swallowing liquids. He's had intermittent left lower quadrant discomfort related to his diverticulosis.     Objective:   Physical Exam Gen.: thin but healthy and well-nourished in  appearance. Alert, appropriate and cooperative throughout exam.Appears younger than age Head: Normocephalic without obvious abnormalities;  no alopecia . Moustache Eyes: No corneal or conjunctival inflammation noted. Pupils equal round reactive to light and accommodation. Fundal exam is benign without hemorrhages, exudate, papilledema. Extraocular motion intact. Vision grossly normal. Ears: External  ear exam reveals no significant lesions or deformities. Canals clear .R TM slightly scarred Nose: External nasal exam reveals no deformity or inflammation. Nasal mucosa are pink and moist. No lesions or exudates noted.  Mouth: Oral mucosa and oropharynx reveal no lesions or exudates. Teeth in good repair. Neck: No deformities, masses, or tenderness noted. Range of motion &. Thyroid normal. Lungs: Normal respiratory effort; chest expands symmetrically. Lungs are clear to auscultation without rales, wheezes, or increased work of breathing.mild pectus excavatum with slight  flaring of inferior rib cage Heart: Slow rate and regular  rhythm. Normal S1 and S2. No gallop, click, or rub. S4 with slurring w/o  murmur. Abdomen: Bowel sounds normal; abdomen soft and nontender. No masses, organomegaly or hernias noted. Genitalia/DRE: Dr Aldean Ast   .                                                                                   Musculoskeletal/extremities: No deformity or scoliosis noted of  the thoracic or lumbar spine. No clubbing, cyanosis, edema. Flexion @  DIP R 4th  finger. Range of motion  normal .Tone & strength  normal.Joints normal. Nail health  good. Vascular: Carotid, radial artery, dorsalis pedis and  posterior tibial pulses are full and equal. No bruits present.Aorta palpable Neurologic: Alert and oriented x3. Deep tendon reflexes symmetrical and normal.          Skin: Intact without suspicious lesions or rashes. Lymph: No cervical, axillary  lymphadenopathy present. Psych: Mood and affect are  normal. Normally interactive                                                                                         Assessment & Plan:  #1 Medicare Wellness Exam; criteria met ; data entered #2 Problem List reviewed ; Assessment/ Recommendations made Plan: see Orders

## 2011-02-24 ENCOUNTER — Telehealth: Payer: Self-pay | Admitting: Internal Medicine

## 2011-02-24 MED ORDER — ZOSTER VACCINE LIVE 19400 UNT/0.65ML ~~LOC~~ SOLR: 0.6500 mL | Freq: Once | SUBCUTANEOUS | Status: DC

## 2011-02-24 NOTE — Telephone Encounter (Signed)
Patient wants rx for shigles vac - he would like to pick up today Thursday [id number]

## 2011-02-24 NOTE — Telephone Encounter (Signed)
Patient aware rx available for pick-up  

## 2011-04-28 ENCOUNTER — Other Ambulatory Visit: Payer: Self-pay | Admitting: Dermatology

## 2011-05-19 ENCOUNTER — Other Ambulatory Visit: Payer: Self-pay | Admitting: Internal Medicine

## 2011-05-20 MED ORDER — AMLODIPINE BESY-BENAZEPRIL HCL 10-20 MG PO CAPS: 1.0000 | ORAL_CAPSULE | Freq: Every day | ORAL | Status: DC

## 2011-05-20 NOTE — Telephone Encounter (Signed)
RX sent

## 2011-07-18 ENCOUNTER — Other Ambulatory Visit: Payer: Self-pay | Admitting: Internal Medicine

## 2011-07-18 MED ORDER — FINASTERIDE 5 MG PO TABS: 5.0000 mg | ORAL_TABLET | Freq: Every day | ORAL | Status: DC

## 2011-07-18 NOTE — Telephone Encounter (Signed)
RX sent

## 2011-10-04 DIAGNOSIS — Z85820 Personal history of malignant melanoma of skin: Secondary | ICD-10-CM | POA: Diagnosis not present

## 2011-10-04 DIAGNOSIS — L570 Actinic keratosis: Secondary | ICD-10-CM | POA: Diagnosis not present

## 2011-10-19 ENCOUNTER — Telehealth: Payer: Self-pay | Admitting: Internal Medicine

## 2011-10-19 MED ORDER — TAMSULOSIN HCL 0.4 MG PO CAPS: 0.4000 mg | ORAL_CAPSULE | Freq: Every day | ORAL | Status: DC

## 2011-10-19 NOTE — Telephone Encounter (Signed)
RX sent

## 2011-10-19 NOTE — Telephone Encounter (Signed)
Refill: Flomax .4mg  cap. Take 1 capsule by mouth every day. Qty 90 Last fill 02-04-11.

## 2011-12-08 DIAGNOSIS — M75120 Complete rotator cuff tear or rupture of unspecified shoulder, not specified as traumatic: Secondary | ICD-10-CM | POA: Diagnosis not present

## 2011-12-08 DIAGNOSIS — M25519 Pain in unspecified shoulder: Secondary | ICD-10-CM | POA: Diagnosis not present

## 2011-12-29 DIAGNOSIS — N401 Enlarged prostate with lower urinary tract symptoms: Secondary | ICD-10-CM | POA: Diagnosis not present

## 2011-12-29 DIAGNOSIS — R109 Unspecified abdominal pain: Secondary | ICD-10-CM | POA: Diagnosis not present

## 2011-12-29 DIAGNOSIS — R351 Nocturia: Secondary | ICD-10-CM | POA: Diagnosis not present

## 2012-01-19 DIAGNOSIS — M25519 Pain in unspecified shoulder: Secondary | ICD-10-CM | POA: Diagnosis not present

## 2012-01-23 DIAGNOSIS — M19019 Primary osteoarthritis, unspecified shoulder: Secondary | ICD-10-CM | POA: Diagnosis not present

## 2012-01-26 ENCOUNTER — Telehealth: Payer: Self-pay | Admitting: Internal Medicine

## 2012-01-26 DIAGNOSIS — M19019 Primary osteoarthritis, unspecified shoulder: Secondary | ICD-10-CM | POA: Diagnosis not present

## 2012-01-26 NOTE — Telephone Encounter (Signed)
Patient wanted to give you a heads up that he has planned surgery for 6.26.13 and the dr., is sending over a surgical clear. Form for you to fill out. Last seen 7.10.2012 Patient call back [phone] or [phone] If he needs to be seen first please advise so I can call him back to schedule

## 2012-01-26 NOTE — Telephone Encounter (Signed)
preop OV needed

## 2012-01-27 NOTE — Telephone Encounter (Signed)
Pt coming in Monday at 3:30p.

## 2012-01-30 ENCOUNTER — Ambulatory Visit (INDEPENDENT_AMBULATORY_CARE_PROVIDER_SITE_OTHER): Payer: Medicare Other | Admitting: Internal Medicine

## 2012-01-30 ENCOUNTER — Encounter: Payer: Self-pay | Admitting: Internal Medicine

## 2012-01-30 VITALS — BP 140/66 | HR 58 | Temp 98.2°F | Wt 148.0 lb

## 2012-01-30 DIAGNOSIS — E785 Hyperlipidemia, unspecified: Secondary | ICD-10-CM

## 2012-01-30 DIAGNOSIS — R7309 Other abnormal glucose: Secondary | ICD-10-CM

## 2012-01-30 DIAGNOSIS — Z01818 Encounter for other preprocedural examination: Secondary | ICD-10-CM | POA: Diagnosis not present

## 2012-01-30 DIAGNOSIS — I10 Essential (primary) hypertension: Secondary | ICD-10-CM

## 2012-01-30 NOTE — Progress Notes (Signed)
Subjective:    Patient ID: Roger Ingram, male    DOB: 01/31/1929, 78 y.o.   MRN: [id number]  HPI Dr. Thurston Hole has recommended rotator cuff repair of the right shoulder for rotator cuff tear with biceps tendon rupture sustained in February 2013 starting a chainsaw or weedeater. This has failed to respond to steroid injections. He now has pain with any extension or elevation of the right upper extremity . Past medical history/family history/social history were all reviewed and updated.    Review of Systems HYPERTENSION: Disease Monitoring: Blood pressure -140/70 on average  Chest pain, palpitations- no       Dyspnea- no Medications: Compliance- yes Lightheadedness,Syncope- no    Edema- slightly occasionally  after prolonged standing  FASTING HYPERGLYCEMIA, PMH of: A1c normal Disease Monitoring: Blood Sugar ranges-no monitor Polyuria/phagia/dipsia- only nocturia      Visual problems- no Diet : Avoidance of concentrated sweets has controlled sugar  HYPERLIPIDEMIA: Disease Monitoring: See symptoms for Hypertension Medications: Compliance-no meds  Abd pain, bowel changes- intermittent left lower quadrant discomfort related to diverticulosis   Muscle aches- no           Objective:   Physical Exam Gen.: Thin but healthy and well-nourished in appearance. Alert, appropriate and cooperative throughout exam.Appears younger than stated age  Head: Normocephalic without obvious abnormalities Eyes: No corneal or conjunctival inflammation noted. Pupils equal round reactive to light and accommodation. Extraocular motion intact.  Ears: External  ear exam reveals no significant lesions or deformities. Canals clear .TMs normal. Nose: External nasal exam reveals no deformity or inflammation. Nasal mucosa are pink and moist. No lesions or exudates noted.   Mouth: Oral mucosa and oropharynx reveal no lesions or exudates. Teeth in good repair. Neck: No deformities, masses, or tenderness noted.  Range of motion & Thyroid normal. Lungs: Normal respiratory effort; chest expands symmetrically. Lungs are clear to auscultation without rales, wheezes, or increased work of breathing. Mild pectus with flaring of the inferior rib margins Heart: Normal rate and rhythm. Normal S1 and S2. No gallop, click, or rub. Grade 1/2 over 6 systolic murmur R base . Abdomen: Bowel sounds normal; abdomen soft and nontender. No masses, organomegaly or hernias noted.                                                                       Musculoskeletal/extremities: No deformity or scoliosis noted of  the thoracic or lumbar spine. No clubbing, cyanosis, edema noted. Pain with passive or active elevation of the right upper extremity at the shoulder. Flexion contracture of fourth right PIP. Tone & strength  normal.Joints normal. Nail health  good. Vascular: Carotid, radial artery, dorsalis pedis and  posterior tibial pulses are full and equal. No bruits present. Neurologic: Alert and oriented x3. Deep tendon reflexes symmetrical and normal.          Skin: Intact without suspicious lesions or rashes. Lymph: No cervical, axillary lymphadenopathy present. Psych: Mood and affect are normal. Normally interactive  Assessment & Plan:  #1 symptomatic rotator cuff tear  #2 hypertension, adequate control. EKG reveals poor R-wave progression in the V. leads but no ischemic change. These changes are stable.  Plan: He is medically cleared for the surgery

## 2012-01-30 NOTE — Patient Instructions (Addendum)
.  Share results with  Dr Sherene Sires office.Blood Pressure Goal =  AVERAGE < 140/90,  Ideally is an AVERAGE < 135/85. This AVERAGE should be calculated from @ least 5-7 BP readings taken @ different times of day on different days of week. You should not respond to isolated BP readings , but rather the AVERAGE for that week

## 2012-02-03 ENCOUNTER — Telehealth: Payer: Self-pay

## 2012-02-03 NOTE — Telephone Encounter (Signed)
SHERRI FROM [facility] STATES THEY FAXED OVER A FORM FOR DR HOPPER TO SIGN OFF ON TO CLEAR PT FOR SURGERY HASN'T HEARD FROM HIM AND PT IS SCHEDULED FOR SURGERY CANNOT DO ANYTHING WITHOUT CLEARANCE PLEASE CALL [phone] AND HAVE HER OVER HEADED PAGED

## 2012-02-03 NOTE — Telephone Encounter (Signed)
Have you guys seen a form for this pt from [facility]

## 2012-02-03 NOTE — Telephone Encounter (Signed)
Called [employer] office and explained they need to send surgical clearance to Dr Marga Melnick. They LM for Sherri to take care of it tomorrow.

## 2012-02-03 NOTE — Telephone Encounter (Signed)
This is the wrong Dr Derry Lory need to contact Dr Marga Melnick

## 2012-02-22 DIAGNOSIS — M75120 Complete rotator cuff tear or rupture of unspecified shoulder, not specified as traumatic: Secondary | ICD-10-CM | POA: Diagnosis not present

## 2012-02-22 DIAGNOSIS — M67919 Unspecified disorder of synovium and tendon, unspecified shoulder: Secondary | ICD-10-CM | POA: Diagnosis not present

## 2012-02-22 DIAGNOSIS — M19019 Primary osteoarthritis, unspecified shoulder: Secondary | ICD-10-CM | POA: Diagnosis not present

## 2012-02-22 DIAGNOSIS — M66329 Spontaneous rupture of flexor tendons, unspecified upper arm: Secondary | ICD-10-CM | POA: Diagnosis not present

## 2012-02-22 DIAGNOSIS — M719 Bursopathy, unspecified: Secondary | ICD-10-CM | POA: Diagnosis not present

## 2012-02-22 DIAGNOSIS — M24119 Other articular cartilage disorders, unspecified shoulder: Secondary | ICD-10-CM | POA: Diagnosis not present

## 2012-02-24 ENCOUNTER — Emergency Department (HOSPITAL_COMMUNITY): Payer: Medicare Other

## 2012-02-24 ENCOUNTER — Encounter (HOSPITAL_COMMUNITY): Payer: Self-pay | Admitting: Emergency Medicine

## 2012-02-24 ENCOUNTER — Emergency Department (HOSPITAL_COMMUNITY)
Admission: EM | Admit: 2012-02-24 | Discharge: 2012-02-24 | Disposition: A | Discharge status: Home / Self Care | Payer: Medicare Other | Attending: Emergency Medicine | Admitting: Emergency Medicine

## 2012-02-24 DIAGNOSIS — M24119 Other articular cartilage disorders, unspecified shoulder: Secondary | ICD-10-CM | POA: Diagnosis not present

## 2012-02-24 DIAGNOSIS — M19019 Primary osteoarthritis, unspecified shoulder: Secondary | ICD-10-CM | POA: Diagnosis not present

## 2012-02-24 DIAGNOSIS — I10 Essential (primary) hypertension: Secondary | ICD-10-CM | POA: Insufficient documentation

## 2012-02-24 DIAGNOSIS — R1110 Vomiting, unspecified: Secondary | ICD-10-CM | POA: Diagnosis not present

## 2012-02-24 DIAGNOSIS — K5900 Constipation, unspecified: Secondary | ICD-10-CM | POA: Diagnosis not present

## 2012-02-24 DIAGNOSIS — E785 Hyperlipidemia, unspecified: Secondary | ICD-10-CM | POA: Insufficient documentation

## 2012-02-24 DIAGNOSIS — M75120 Complete rotator cuff tear or rupture of unspecified shoulder, not specified as traumatic: Secondary | ICD-10-CM | POA: Diagnosis not present

## 2012-02-24 DIAGNOSIS — Z85820 Personal history of malignant melanoma of skin: Secondary | ICD-10-CM | POA: Diagnosis not present

## 2012-02-24 DIAGNOSIS — R110 Nausea: Secondary | ICD-10-CM | POA: Insufficient documentation

## 2012-02-24 MED ORDER — FLEET ENEMA 7-19 GM/118ML RE ENEM
ENEMA | RECTAL | Status: AC
  Administered 2012-02-24: 14:00:00
  Filled 2012-02-24: qty 1

## 2012-02-24 NOTE — ED Notes (Signed)
Pt states he had rotator cuff sx on 02/22/12 and the pain medicine that he is taking has made him constipated. Last normal BM 02/22/12, states he vomited x 2 today. Miralax taken 1 hour ago.

## 2012-02-24 NOTE — ED Provider Notes (Signed)
History     CSN: [id number]  Arrival date & time 02/24/12  1117   First MD Initiated Contact with Patient 02/24/12 1146      Chief Complaint  Patient presents with  . Constipation    (Consider location/radiation/quality/duration/timing/severity/associated sxs/prior treatment) Patient is a 78 y.o. male presenting with constipation. The history is provided by the patient.  Constipation    patient here with constipation x2 days with some associated emesis x2. He is on opiate medications. Vomiting has been nonbilious. No fever. Some abdominal cramping noted. Has used laxatives without relief and nothing makes the symptoms worse  Past Medical History  Diagnosis Date  . Basal cell cancer   . Melanoma   . Hx of colonic polyps   . Elevated PSA     [facility]  . Diverticulosis of colon   . Hyperlipidemia   . BPH (benign prostatic hypertrophy)   . Hypertension     Past Surgical History  Procedure Date  . Colonoscopy w/ polypectomy 1997, 2011  . Basal cell     R  & L temple; Dr Terri Piedra  . Melanoma     L forehead  . Right rotator cuff sx     Family History  Problem Relation Age of Onset  . Stroke Mother 65  . Heart attack Father 43  . Cancer Brother 22    colon cancer  . Stroke Paternal Grandfather     in 63s    History  Substance Use Topics  . Smoking status: Never Smoker   . Smokeless tobacco: Not on file  . Alcohol Use: No      Review of Systems  Gastrointestinal: Positive for constipation.  All other systems reviewed and are negative.    Allergies  Hydrocodone and Iohexol  Home Medications   Current Outpatient Rx  Name Route Sig Dispense Refill  . AMLODIPINE BESY-BENAZEPRIL HCL 10-20 MG PO CAPS Oral Take 1 capsule by mouth daily. 90 capsule 3    Please overnight to patient  . FINASTERIDE 5 MG PO TABS Oral Take 1 tablet (5 mg total) by mouth daily. 90 tablet 1  . FOLIC ACID 800 MCG PO TABS Oral Take 400 mcg by mouth daily.      Marland Kitchen POLYETHYLENE  GLYCOL 3350 PO PACK Oral Take 17 g by mouth daily.    Marland Kitchen TAMSULOSIN HCL 0.4 MG PO CAPS Oral Take 1 capsule (0.4 mg total) by mouth daily. 90 capsule 1    Please Overnight to patient    BP 149/72  Pulse 63  Temp 97.5 F (36.4 C) (Oral)  Wt 148 lb (67.132 kg)  SpO2 99%  Physical Exam  Nursing note and vitals reviewed. Constitutional: He is oriented to person, place, and time. He appears well-developed and well-nourished.  Non-toxic appearance. No distress.  HENT:  Head: Normocephalic and atraumatic.  Eyes: Conjunctivae, EOM and lids are normal. Pupils are equal, round, and reactive to light.  Neck: Normal range of motion. Neck supple. No tracheal deviation present. No mass present.  Cardiovascular: Normal rate, regular rhythm and normal heart sounds.  Exam reveals no gallop.   No murmur heard. Pulmonary/Chest: Effort normal and breath sounds normal. No stridor. No respiratory distress. He has no decreased breath sounds. He has no wheezes. He has no rhonchi. He has no rales.  Abdominal: Soft. Normal appearance and bowel sounds are normal. He exhibits no distension. There is generalized tenderness. There is no rigidity, no rebound, no guarding and no CVA tenderness.  Genitourinary: Rectal exam shows no fissure, no mass and no tenderness.       No stool in vault  Musculoskeletal: Normal range of motion. He exhibits no edema and no tenderness.  Neurological: He is alert and oriented to person, place, and time. He has normal strength. No cranial nerve deficit or sensory deficit. GCS eye subscore is 4. GCS verbal subscore is 5. GCS motor subscore is 6.  Skin: Skin is warm and dry. No abrasion and no rash noted.  Psychiatric: He has a normal mood and affect. His speech is normal and behavior is normal.    ED Course  Procedures (including critical care time)  Labs Reviewed - No data to display No results found.   No diagnosis found.    MDM  Patient given enema here and feels better.  No concern for obstruction. Stable for discharge        Toy Baker, MD 02/24/12 1421

## 2012-02-28 DIAGNOSIS — M75120 Complete rotator cuff tear or rupture of unspecified shoulder, not specified as traumatic: Secondary | ICD-10-CM | POA: Diagnosis not present

## 2012-02-28 DIAGNOSIS — M24119 Other articular cartilage disorders, unspecified shoulder: Secondary | ICD-10-CM | POA: Diagnosis not present

## 2012-02-28 DIAGNOSIS — M19019 Primary osteoarthritis, unspecified shoulder: Secondary | ICD-10-CM | POA: Diagnosis not present

## 2012-03-01 DIAGNOSIS — M19019 Primary osteoarthritis, unspecified shoulder: Secondary | ICD-10-CM | POA: Diagnosis not present

## 2012-03-01 DIAGNOSIS — M24119 Other articular cartilage disorders, unspecified shoulder: Secondary | ICD-10-CM | POA: Diagnosis not present

## 2012-03-01 DIAGNOSIS — M75120 Complete rotator cuff tear or rupture of unspecified shoulder, not specified as traumatic: Secondary | ICD-10-CM | POA: Diagnosis not present

## 2012-03-06 DIAGNOSIS — M75120 Complete rotator cuff tear or rupture of unspecified shoulder, not specified as traumatic: Secondary | ICD-10-CM | POA: Diagnosis not present

## 2012-03-06 DIAGNOSIS — M19019 Primary osteoarthritis, unspecified shoulder: Secondary | ICD-10-CM | POA: Diagnosis not present

## 2012-03-06 DIAGNOSIS — M24119 Other articular cartilage disorders, unspecified shoulder: Secondary | ICD-10-CM | POA: Diagnosis not present

## 2012-03-07 DIAGNOSIS — M24119 Other articular cartilage disorders, unspecified shoulder: Secondary | ICD-10-CM | POA: Diagnosis not present

## 2012-03-07 DIAGNOSIS — M19019 Primary osteoarthritis, unspecified shoulder: Secondary | ICD-10-CM | POA: Diagnosis not present

## 2012-03-07 DIAGNOSIS — M75120 Complete rotator cuff tear or rupture of unspecified shoulder, not specified as traumatic: Secondary | ICD-10-CM | POA: Diagnosis not present

## 2012-03-12 DIAGNOSIS — M19019 Primary osteoarthritis, unspecified shoulder: Secondary | ICD-10-CM | POA: Diagnosis not present

## 2012-03-12 DIAGNOSIS — M24119 Other articular cartilage disorders, unspecified shoulder: Secondary | ICD-10-CM | POA: Diagnosis not present

## 2012-03-12 DIAGNOSIS — M75120 Complete rotator cuff tear or rupture of unspecified shoulder, not specified as traumatic: Secondary | ICD-10-CM | POA: Diagnosis not present

## 2012-03-15 DIAGNOSIS — M24119 Other articular cartilage disorders, unspecified shoulder: Secondary | ICD-10-CM | POA: Diagnosis not present

## 2012-03-15 DIAGNOSIS — M75120 Complete rotator cuff tear or rupture of unspecified shoulder, not specified as traumatic: Secondary | ICD-10-CM | POA: Diagnosis not present

## 2012-03-15 DIAGNOSIS — M19019 Primary osteoarthritis, unspecified shoulder: Secondary | ICD-10-CM | POA: Diagnosis not present

## 2012-03-19 DIAGNOSIS — M24119 Other articular cartilage disorders, unspecified shoulder: Secondary | ICD-10-CM | POA: Diagnosis not present

## 2012-03-19 DIAGNOSIS — M75120 Complete rotator cuff tear or rupture of unspecified shoulder, not specified as traumatic: Secondary | ICD-10-CM | POA: Diagnosis not present

## 2012-03-19 DIAGNOSIS — M19019 Primary osteoarthritis, unspecified shoulder: Secondary | ICD-10-CM | POA: Diagnosis not present

## 2012-03-21 ENCOUNTER — Telehealth: Payer: Self-pay | Admitting: Internal Medicine

## 2012-03-21 MED ORDER — FINASTERIDE 5 MG PO TABS: 5.0000 mg | ORAL_TABLET | Freq: Every day | ORAL | Status: DC

## 2012-03-21 NOTE — Telephone Encounter (Signed)
RX sent

## 2012-03-21 NOTE — Telephone Encounter (Signed)
Refill: Proscar 5mg  tab. Take one tablet by mouth every day. Qty 90. Last fill 11-04-11

## 2012-03-23 DIAGNOSIS — M75120 Complete rotator cuff tear or rupture of unspecified shoulder, not specified as traumatic: Secondary | ICD-10-CM | POA: Diagnosis not present

## 2012-03-23 DIAGNOSIS — M19019 Primary osteoarthritis, unspecified shoulder: Secondary | ICD-10-CM | POA: Diagnosis not present

## 2012-03-23 DIAGNOSIS — M24119 Other articular cartilage disorders, unspecified shoulder: Secondary | ICD-10-CM | POA: Diagnosis not present

## 2012-03-27 DIAGNOSIS — M19019 Primary osteoarthritis, unspecified shoulder: Secondary | ICD-10-CM | POA: Diagnosis not present

## 2012-03-27 DIAGNOSIS — M75120 Complete rotator cuff tear or rupture of unspecified shoulder, not specified as traumatic: Secondary | ICD-10-CM | POA: Diagnosis not present

## 2012-03-27 DIAGNOSIS — M24119 Other articular cartilage disorders, unspecified shoulder: Secondary | ICD-10-CM | POA: Diagnosis not present

## 2012-03-30 DIAGNOSIS — M19019 Primary osteoarthritis, unspecified shoulder: Secondary | ICD-10-CM | POA: Diagnosis not present

## 2012-03-30 DIAGNOSIS — M24119 Other articular cartilage disorders, unspecified shoulder: Secondary | ICD-10-CM | POA: Diagnosis not present

## 2012-03-30 DIAGNOSIS — M75120 Complete rotator cuff tear or rupture of unspecified shoulder, not specified as traumatic: Secondary | ICD-10-CM | POA: Diagnosis not present

## 2012-04-04 DIAGNOSIS — M75120 Complete rotator cuff tear or rupture of unspecified shoulder, not specified as traumatic: Secondary | ICD-10-CM | POA: Diagnosis not present

## 2012-04-04 DIAGNOSIS — M24119 Other articular cartilage disorders, unspecified shoulder: Secondary | ICD-10-CM | POA: Diagnosis not present

## 2012-04-04 DIAGNOSIS — M19019 Primary osteoarthritis, unspecified shoulder: Secondary | ICD-10-CM | POA: Diagnosis not present

## 2012-04-09 DIAGNOSIS — M75120 Complete rotator cuff tear or rupture of unspecified shoulder, not specified as traumatic: Secondary | ICD-10-CM | POA: Diagnosis not present

## 2012-04-09 DIAGNOSIS — M19019 Primary osteoarthritis, unspecified shoulder: Secondary | ICD-10-CM | POA: Diagnosis not present

## 2012-04-09 DIAGNOSIS — M24119 Other articular cartilage disorders, unspecified shoulder: Secondary | ICD-10-CM | POA: Diagnosis not present

## 2012-04-17 DIAGNOSIS — M19019 Primary osteoarthritis, unspecified shoulder: Secondary | ICD-10-CM | POA: Diagnosis not present

## 2012-04-17 DIAGNOSIS — M75120 Complete rotator cuff tear or rupture of unspecified shoulder, not specified as traumatic: Secondary | ICD-10-CM | POA: Diagnosis not present

## 2012-04-17 DIAGNOSIS — M24119 Other articular cartilage disorders, unspecified shoulder: Secondary | ICD-10-CM | POA: Diagnosis not present

## 2012-04-23 DIAGNOSIS — M75120 Complete rotator cuff tear or rupture of unspecified shoulder, not specified as traumatic: Secondary | ICD-10-CM | POA: Diagnosis not present

## 2012-04-23 DIAGNOSIS — M19019 Primary osteoarthritis, unspecified shoulder: Secondary | ICD-10-CM | POA: Diagnosis not present

## 2012-04-23 DIAGNOSIS — M24119 Other articular cartilage disorders, unspecified shoulder: Secondary | ICD-10-CM | POA: Diagnosis not present

## 2012-04-30 DIAGNOSIS — M19019 Primary osteoarthritis, unspecified shoulder: Secondary | ICD-10-CM | POA: Diagnosis not present

## 2012-04-30 DIAGNOSIS — M75120 Complete rotator cuff tear or rupture of unspecified shoulder, not specified as traumatic: Secondary | ICD-10-CM | POA: Diagnosis not present

## 2012-04-30 DIAGNOSIS — M24119 Other articular cartilage disorders, unspecified shoulder: Secondary | ICD-10-CM | POA: Diagnosis not present

## 2012-05-04 ENCOUNTER — Other Ambulatory Visit: Payer: Self-pay | Admitting: Internal Medicine

## 2012-05-04 DIAGNOSIS — M24119 Other articular cartilage disorders, unspecified shoulder: Secondary | ICD-10-CM | POA: Diagnosis not present

## 2012-05-04 DIAGNOSIS — M75120 Complete rotator cuff tear or rupture of unspecified shoulder, not specified as traumatic: Secondary | ICD-10-CM | POA: Diagnosis not present

## 2012-05-04 DIAGNOSIS — M19019 Primary osteoarthritis, unspecified shoulder: Secondary | ICD-10-CM | POA: Diagnosis not present

## 2012-05-10 DIAGNOSIS — M19019 Primary osteoarthritis, unspecified shoulder: Secondary | ICD-10-CM | POA: Diagnosis not present

## 2012-05-10 DIAGNOSIS — M75120 Complete rotator cuff tear or rupture of unspecified shoulder, not specified as traumatic: Secondary | ICD-10-CM | POA: Diagnosis not present

## 2012-05-10 DIAGNOSIS — M24119 Other articular cartilage disorders, unspecified shoulder: Secondary | ICD-10-CM | POA: Diagnosis not present

## 2012-05-28 DIAGNOSIS — M75120 Complete rotator cuff tear or rupture of unspecified shoulder, not specified as traumatic: Secondary | ICD-10-CM | POA: Diagnosis not present

## 2012-05-28 DIAGNOSIS — M19019 Primary osteoarthritis, unspecified shoulder: Secondary | ICD-10-CM | POA: Diagnosis not present

## 2012-05-28 DIAGNOSIS — M24119 Other articular cartilage disorders, unspecified shoulder: Secondary | ICD-10-CM | POA: Diagnosis not present

## 2012-07-02 DIAGNOSIS — Z85820 Personal history of malignant melanoma of skin: Secondary | ICD-10-CM | POA: Diagnosis not present

## 2012-07-02 DIAGNOSIS — D235 Other benign neoplasm of skin of trunk: Secondary | ICD-10-CM | POA: Diagnosis not present

## 2012-10-30 ENCOUNTER — Other Ambulatory Visit: Payer: Self-pay | Admitting: Internal Medicine

## 2012-11-28 DIAGNOSIS — H2510 Age-related nuclear cataract, unspecified eye: Secondary | ICD-10-CM | POA: Diagnosis not present

## 2012-11-28 DIAGNOSIS — H52209 Unspecified astigmatism, unspecified eye: Secondary | ICD-10-CM | POA: Diagnosis not present

## 2012-11-28 DIAGNOSIS — H04569 Stenosis of unspecified lacrimal punctum: Secondary | ICD-10-CM | POA: Diagnosis not present

## 2012-11-28 DIAGNOSIS — H04229 Epiphora due to insufficient drainage, unspecified lacrimal gland: Secondary | ICD-10-CM | POA: Diagnosis not present

## 2012-12-14 DIAGNOSIS — M25539 Pain in unspecified wrist: Secondary | ICD-10-CM | POA: Diagnosis not present

## 2012-12-31 DIAGNOSIS — D235 Other benign neoplasm of skin of trunk: Secondary | ICD-10-CM | POA: Diagnosis not present

## 2012-12-31 DIAGNOSIS — B079 Viral wart, unspecified: Secondary | ICD-10-CM | POA: Diagnosis not present

## 2012-12-31 DIAGNOSIS — Z85820 Personal history of malignant melanoma of skin: Secondary | ICD-10-CM | POA: Diagnosis not present

## 2012-12-31 DIAGNOSIS — D485 Neoplasm of uncertain behavior of skin: Secondary | ICD-10-CM | POA: Diagnosis not present

## 2013-01-16 ENCOUNTER — Other Ambulatory Visit: Payer: Self-pay | Admitting: Internal Medicine

## 2013-01-16 NOTE — Telephone Encounter (Signed)
Pending appointment

## 2013-02-04 ENCOUNTER — Encounter: Payer: Medicare Other | Admitting: Internal Medicine

## 2013-02-11 DIAGNOSIS — N402 Nodular prostate without lower urinary tract symptoms: Secondary | ICD-10-CM | POA: Diagnosis not present

## 2013-02-11 DIAGNOSIS — N401 Enlarged prostate with lower urinary tract symptoms: Secondary | ICD-10-CM | POA: Diagnosis not present

## 2013-02-11 DIAGNOSIS — N281 Cyst of kidney, acquired: Secondary | ICD-10-CM | POA: Diagnosis not present

## 2013-03-05 ENCOUNTER — Ambulatory Visit (INDEPENDENT_AMBULATORY_CARE_PROVIDER_SITE_OTHER): Payer: Medicare Other | Admitting: Internal Medicine

## 2013-03-05 ENCOUNTER — Encounter: Payer: Self-pay | Admitting: Internal Medicine

## 2013-03-05 VITALS — BP 114/68 | HR 61 | Temp 97.8°F | Resp 12 | Ht 67.5 in | Wt 143.0 lb

## 2013-03-05 DIAGNOSIS — I10 Essential (primary) hypertension: Secondary | ICD-10-CM

## 2013-03-05 DIAGNOSIS — Z0000 Encounter for general adult medical examination without abnormal findings: Secondary | ICD-10-CM | POA: Diagnosis not present

## 2013-03-05 DIAGNOSIS — E785 Hyperlipidemia, unspecified: Secondary | ICD-10-CM

## 2013-03-05 DIAGNOSIS — Z86010 Personal history of colonic polyps: Secondary | ICD-10-CM

## 2013-03-05 LAB — HEPATIC FUNCTION PANEL
AST: 20 U/L (ref 0–37)
Total Bilirubin: 0.8 mg/dL (ref 0.3–1.2)

## 2013-03-05 LAB — BASIC METABOLIC PANEL
BUN: 20 mg/dL (ref 6–23)
GFR: 65.32 mL/min (ref >60.00)
Glucose, Bld: 92 mg/dL (ref 70–99)
Potassium: 4.2 mEq/L (ref 3.5–5.1)

## 2013-03-05 LAB — LIPID PANEL
Cholesterol: 209 mg/dL — ABNORMAL HIGH (ref 0–200)
HDL: 80.3 mg/dL (ref >39.00)
Total CHOL/HDL Ratio: 3
Triglycerides: 51 mg/dL (ref 0.0–149.0)
VLDL: 10.2 mg/dL (ref 0.0–40.0)

## 2013-03-05 LAB — CBC WITH DIFFERENTIAL/PLATELET
Basophils Absolute: 0 10*3/uL (ref 0.0–0.1)
HCT: 41.9 % (ref 39.0–52.0)
Lymphs Abs: 2.3 10*3/uL (ref 0.7–4.0)
Monocytes Absolute: 0.6 10*3/uL (ref 0.1–1.0)
Monocytes Relative: 7.2 % (ref 3.0–12.0)
Platelets: 219 10*3/uL (ref 150.0–400.0)
RDW: 12.9 % (ref 11.5–14.6)

## 2013-03-05 MED ORDER — PROPRANOLOL HCL 10 MG PO TABS: ORAL_TABLET | ORAL | Status: DC

## 2013-03-05 NOTE — Progress Notes (Signed)
Subjective:    Patient ID: Roger Ingram, male    DOB: 12-22-1928, 79 y.o.   MRN: [id number]  HPI Medicare Wellness Visit:  Psychosocial & medical history were reviewed as required by Medicare (abuse,antisocial behavioral risks,firearm risk).  Social history: caffeine:2 cups /day  , alcohol:no   ,  tobacco use:  never Exercise :  Walking bid for 20 min Home & personal  safety / fall risk: imbalance due to ruptured eardrum Limitations of activities of daily living:no Seatbelt  and smoke alarm use: yes Power of Attorney/Living Will status : in place Ophthalmology exam status :current Hearing evaluation status:current Orientation :oriented X 3 Memory & recall :good Spelling  testing:good Active depression / anxiety: denied Foreign travel history : last 30 [place] Immunization status for Shingles /Flu/ PNA/ tetanus : ? tetanus Transfusion history:  no Preventive health surveillance status of colonoscopy as per protocol/ SOC: current Dental care:  biannually Chart reviewed &  Updated. Active issues reviewed & addressed.      Review of Systems CHRONIC HYPERTENSION follow-up:  Home blood pressure  average 140/70  Patient is compliant with medications  No adverse effects noted from medication; specifically no cough  On low-salt diet    No chest pain, palpitations, dyspnea, claudication,edema or paroxysmal nocturnal dyspnea described  Occasional positional lightheadedness. No epistaxis or syncope. Occasional L temple neuritis          Objective:   Physical Exam  Gen.: Thin but healthy and well-nourished in appearance. Alert, appropriate and cooperative throughout exam.Appears much  younger than stated age  Head: Normocephalic without obvious abnormalities; no alopecia . Moustache Eyes: No corneal or conjunctival inflammation noted.  Extraocular motion intact. Vision grossly normal with lenses Ears: External  ear exam reveals no significant lesions or deformities.  Canals clear . R TM scarred.  Decreased hearing on R Nose: External nasal exam reveals no deformity or inflammation. Nasal mucosa are pink and moist. No lesions or exudates noted.   Mouth: Oral mucosa and oropharynx reveal no lesions or exudates. Teeth in good repair. Neck: No deformities, masses, or tenderness noted. Range of motion & Thyroid normal. Lungs: Normal respiratory effort; chest expands symmetrically. Lungs are clear to auscultation without rales, wheezes, or increased work of breathing. Heart: Normal rate and rhythm. Normal S1 and S2. No gallop, click, or rub. Grade 1/2 over 6 systolic murmur. Abdomen: Bowel sounds normal; abdomen soft and nontender. No masses, organomegaly or hernias noted.Aortic bruit ; no AAA Genitalia:As per Dr Mena Goes                               Musculoskeletal/extremities: No deformity or scoliosis noted of  the thoracic or lumbar spine.   No clubbing, cyanosis, edema, or significant extremity  deformity noted. Range of motion normal .Tone & strength  Normal. Joints  reveal mild  DIP flexion 4th R finger. . Nail health good. Able to lie down & sit up w/o help. Negative SLR bilaterally Vascular: Carotid, radial artery, dorsalis pedis and  posterior tibial pulses are full and equal. No bruits present. Neurologic: Alert and oriented x3. Deep tendon reflexes symmetrical and normal.         Skin: Intact without suspicious lesions or rashes. Lymph: No cervical, axillary lymphadenopathy present. Psych: Mood and affect are normal. Normally interactive  Assessment & Plan:  #1 Medicare Wellness Exam; criteria met ; data entered #2 Problem List/Diagnoses reviewed Plan:  Assessments made/ Orders entered  

## 2013-03-05 NOTE — Patient Instructions (Addendum)
Share results with all non [place] medical staff seen . To decrease essential tremor, avoid stimulants such as decongestants, diet pills, nicotine, or caffeine (coffee, tea, cola, or chocolate) to excess.

## 2013-03-12 ENCOUNTER — Telehealth: Payer: Self-pay | Admitting: *Deleted

## 2013-03-12 NOTE — Telephone Encounter (Signed)
Pt left message on triage line with questions concerning recent labs, left message on to return call.

## 2013-04-09 ENCOUNTER — Other Ambulatory Visit: Payer: Self-pay | Admitting: Internal Medicine

## 2013-04-29 ENCOUNTER — Other Ambulatory Visit: Payer: Self-pay | Admitting: Internal Medicine

## 2013-04-30 NOTE — Telephone Encounter (Signed)
Med filled.  

## 2013-05-04 ENCOUNTER — Other Ambulatory Visit: Payer: Self-pay | Admitting: Internal Medicine

## 2013-05-08 NOTE — Telephone Encounter (Signed)
Med filled.  

## 2013-05-28 ENCOUNTER — Other Ambulatory Visit: Payer: Self-pay | Admitting: Internal Medicine

## 2013-05-28 NOTE — Telephone Encounter (Signed)
Flomax refill sent to pharmacy 

## 2013-06-04 DIAGNOSIS — L723 Sebaceous cyst: Secondary | ICD-10-CM | POA: Diagnosis not present

## 2013-06-04 DIAGNOSIS — D485 Neoplasm of uncertain behavior of skin: Secondary | ICD-10-CM | POA: Diagnosis not present

## 2013-06-20 ENCOUNTER — Other Ambulatory Visit: Payer: Self-pay | Admitting: Internal Medicine

## 2013-06-20 NOTE — Telephone Encounter (Signed)
Propranolol refill sent to pharmacy 

## 2013-07-31 ENCOUNTER — Other Ambulatory Visit: Payer: Self-pay | Admitting: Internal Medicine

## 2013-07-31 NOTE — Telephone Encounter (Signed)
Finasteride refilled per protocol. JG//CMA 

## 2013-08-26 DIAGNOSIS — Z85820 Personal history of malignant melanoma of skin: Secondary | ICD-10-CM | POA: Diagnosis not present

## 2013-08-27 DIAGNOSIS — N402 Nodular prostate without lower urinary tract symptoms: Secondary | ICD-10-CM | POA: Diagnosis not present

## 2013-10-17 DIAGNOSIS — M545 Low back pain, unspecified: Secondary | ICD-10-CM | POA: Diagnosis not present

## 2013-10-17 DIAGNOSIS — I10 Essential (primary) hypertension: Secondary | ICD-10-CM | POA: Diagnosis not present

## 2013-10-26 ENCOUNTER — Encounter: Payer: Self-pay | Admitting: Internal Medicine

## 2013-10-26 DIAGNOSIS — M5137 Other intervertebral disc degeneration, lumbosacral region: Secondary | ICD-10-CM | POA: Insufficient documentation

## 2014-01-28 ENCOUNTER — Other Ambulatory Visit: Payer: Self-pay

## 2014-01-28 MED ORDER — AMLODIPINE BESY-BENAZEPRIL HCL 10-20 MG PO CAPS: 1.0000 | ORAL_CAPSULE | Freq: Every day | ORAL | Status: DC

## 2014-02-06 ENCOUNTER — Telehealth: Payer: Self-pay | Admitting: Internal Medicine

## 2014-02-06 MED ORDER — AMLODIPINE BESY-BENAZEPRIL HCL 10-20 MG PO CAPS: 1.0000 | ORAL_CAPSULE | Freq: Every day | ORAL | Status: DC

## 2014-02-06 NOTE — Telephone Encounter (Signed)
30

## 2014-02-06 NOTE — Telephone Encounter (Signed)
Pt call request 3 pill for Lotrel 10-20 mg to be call into [facility] on [address]. Pt is waiting for the mail order to come in (not arrive yet) and he has to leave town today. Please advise.

## 2014-02-21 ENCOUNTER — Other Ambulatory Visit: Payer: Self-pay

## 2014-02-21 MED ORDER — PROPRANOLOL HCL 10 MG PO TABS: ORAL_TABLET | ORAL | Status: DC

## 2014-03-06 DIAGNOSIS — G5600 Carpal tunnel syndrome, unspecified upper limb: Secondary | ICD-10-CM | POA: Diagnosis not present

## 2014-03-06 DIAGNOSIS — M6530 Trigger finger, unspecified finger: Secondary | ICD-10-CM | POA: Diagnosis not present

## 2014-03-06 DIAGNOSIS — M19049 Primary osteoarthritis, unspecified hand: Secondary | ICD-10-CM | POA: Diagnosis not present

## 2014-03-06 DIAGNOSIS — G5620 Lesion of ulnar nerve, unspecified upper limb: Secondary | ICD-10-CM | POA: Diagnosis not present

## 2014-03-17 ENCOUNTER — Other Ambulatory Visit (INDEPENDENT_AMBULATORY_CARE_PROVIDER_SITE_OTHER): Payer: Medicare Other

## 2014-03-17 ENCOUNTER — Ambulatory Visit (INDEPENDENT_AMBULATORY_CARE_PROVIDER_SITE_OTHER): Payer: Medicare Other | Admitting: Internal Medicine

## 2014-03-17 ENCOUNTER — Encounter: Payer: Self-pay | Admitting: Internal Medicine

## 2014-03-17 VITALS — BP 130/76 | HR 56 | Temp 97.9°F | Ht 68.0 in | Wt 144.4 lb

## 2014-03-17 DIAGNOSIS — I10 Essential (primary) hypertension: Secondary | ICD-10-CM

## 2014-03-17 DIAGNOSIS — Z86010 Personal history of colonic polyps: Secondary | ICD-10-CM

## 2014-03-17 DIAGNOSIS — R7309 Other abnormal glucose: Secondary | ICD-10-CM

## 2014-03-17 DIAGNOSIS — E785 Hyperlipidemia, unspecified: Secondary | ICD-10-CM

## 2014-03-17 DIAGNOSIS — R972 Elevated prostate specific antigen [PSA]: Secondary | ICD-10-CM

## 2014-03-17 DIAGNOSIS — G252 Other specified forms of tremor: Secondary | ICD-10-CM

## 2014-03-17 DIAGNOSIS — G250 Essential tremor: Secondary | ICD-10-CM

## 2014-03-17 LAB — CBC WITH DIFFERENTIAL/PLATELET
BASOS PCT: 0.4 % (ref 0.0–3.0)
Basophils Absolute: 0 10*3/uL (ref 0.0–0.1)
EOS PCT: 1.8 % (ref 0.0–5.0)
Eosinophils Absolute: 0.1 10*3/uL (ref 0.0–0.7)
HEMATOCRIT: 44.3 % (ref 39.0–52.0)
HEMOGLOBIN: 14.7 g/dL (ref 13.0–17.0)
Lymphocytes Relative: 30.1 % (ref 12.0–46.0)
Lymphs Abs: 2.3 10*3/uL (ref 0.7–4.0)
MCHC: 33.2 g/dL (ref 30.0–36.0)
MCV: 91.4 fl (ref 78.0–100.0)
MONO ABS: 0.6 10*3/uL (ref 0.1–1.0)
Monocytes Relative: 7.7 % (ref 3.0–12.0)
NEUTROS ABS: 4.6 10*3/uL (ref 1.4–7.7)
NEUTROS PCT: 60 % (ref 43.0–77.0)
Platelets: 228 10*3/uL (ref 150.0–400.0)
RBC: 4.84 Mil/uL (ref 4.22–5.81)
RDW: 13.4 % (ref 11.5–15.5)
WBC: 7.6 10*3/uL (ref 4.0–10.5)

## 2014-03-17 LAB — LIPID PANEL
CHOL/HDL RATIO: 3
Cholesterol: 218 mg/dL — ABNORMAL HIGH (ref 0–200)
HDL: 77.9 mg/dL (ref >39.00)
LDL Cholesterol: 127 mg/dL — ABNORMAL HIGH (ref 0–99)
NONHDL: 140.1
TRIGLYCERIDES: 68 mg/dL (ref 0.0–149.0)
VLDL: 13.6 mg/dL (ref 0.0–40.0)

## 2014-03-17 LAB — BASIC METABOLIC PANEL
BUN: 23 mg/dL (ref 6–23)
CHLORIDE: 104 meq/L (ref 96–112)
CO2: 24 meq/L (ref 19–32)
Calcium: 9.4 mg/dL (ref 8.4–10.5)
Creatinine, Ser: 1.2 mg/dL (ref 0.4–1.5)
GFR: 62.62 mL/min (ref >60.00)
Glucose, Bld: 96 mg/dL (ref 70–99)
POTASSIUM: 5.4 meq/L — AB (ref 3.5–5.1)
SODIUM: 136 meq/L (ref 135–145)

## 2014-03-17 LAB — HEPATIC FUNCTION PANEL
ALBUMIN: 4.1 g/dL (ref 3.5–5.2)
ALT: 16 U/L (ref 0–53)
AST: 22 U/L (ref 0–37)
Alkaline Phosphatase: 58 U/L (ref 39–117)
BILIRUBIN DIRECT: 0.1 mg/dL (ref 0.0–0.3)
TOTAL PROTEIN: 7 g/dL (ref 6.0–8.3)
Total Bilirubin: 0.7 mg/dL (ref 0.2–1.2)

## 2014-03-17 MED ORDER — PROPRANOLOL HCL 10 MG PO TABS
ORAL_TABLET | ORAL | Status: DC
Start: 2014-03-17 — End: 2015-04-09

## 2014-03-17 NOTE — Assessment & Plan Note (Signed)
PSA as per Dr Junious Silk

## 2014-03-17 NOTE — Assessment & Plan Note (Signed)
CBC

## 2014-03-17 NOTE — Assessment & Plan Note (Signed)
A1c

## 2014-03-17 NOTE — Patient Instructions (Signed)
Minimal Blood Pressure Goal= AVERAGE < 140/90;  Ideal is an AVERAGE < 135/85. This AVERAGE should be calculated from @ least 5-7 BP readings taken @ different times of day on different days of week. You should not respond to isolated BP readings , but rather the AVERAGE for that week .Please bring your  blood pressure cuff to office visits to verify that it is reliable.It  can also be checked against the blood pressure device at the pharmacy. Finger or wrist cuffs are not dependable; an arm cuff is.   Your next office appointment will be determined based upon review of your pending labs . Those instructions will be transmitted to you  by mail. 

## 2014-03-17 NOTE — Progress Notes (Signed)
Pre visit review using our clinic review tool, if applicable. No additional management support is needed unless otherwise documented below in the visit note. 

## 2014-03-17 NOTE — Assessment & Plan Note (Signed)
Lipids, LFTs, TSH  

## 2014-03-17 NOTE — Assessment & Plan Note (Signed)
Blood pressure goals reviewed. BMET 

## 2014-03-17 NOTE — Progress Notes (Signed)
   Subjective:    Patient ID: Roger Ingram, male    DOB: 1929-08-15, 80 y.o.   MRN: [id number]  HPI He is here to assess active health issues & conditions. PMH, FH, & Social history verified & updated  He has been compliant with his pressure medicines. The propranolol is being taken for essential tremor with good response. He is on a heart healthy, low-sodium diet. He walks 7 days a week, 3 miles per day. There is a strong family history of stroke but not premature.   Review of Systems   Significant headaches, epistaxis, chest pain, palpitations, exertional dyspnea, claudication, paroxysmal nocturnal dyspnea, or edema absent.  His major symptom is nocturia on average 3 times per night. Has a followup appointment with his urologist. The urologist has monitored his PSA.            Objective:   Physical Exam  Gen.: Healthy and well-nourished in appearance. Alert, appropriate and cooperative throughout exam. Appears younger than stated age  Head: Normocephalic without obvious abnormalities; no alopecia.Moustache Eyes: No corneal or conjunctival inflammation noted. Pupils equal round reactive to light and accommodation. Extraocular motion intact.  Ears: External  ear exam reveals no significant lesions or deformities. Canals clear .TMs normal. Hearing is grossly decreased bilaterally; R > L. Nose: External nasal exam reveals no deformity or inflammation. Nasal mucosa are pink and moist. No lesions or exudates noted.   Mouth: Oral mucosa and oropharynx reveal no lesions or exudates. Teeth in good repair. Neck: No deformities, normal. Lungs: Normal respiratory effort; chest expands symmetrically. Lungs are clear to auscultation without rales, wheezes, or increased work of breathing. Heart: Slow rate and regular rhythm. Normal S1 and S2. No gallop, click, or rub. No murmur. Abdomen: Bowel sounds normal; abdomen soft and nontender. No masses, organomegaly or hernias noted. Genitalia:  as  per Urology                                 Musculoskeletal/extremities: No deformity or scoliosis noted of  the thoracic or lumbar spine.  No clubbing, cyanosis, edema, or significant extremity  deformity noted. Range of motion normal .Tone & strength normal. Hand joints normal  Fingernail  health good. Able to lie down & sit up w/o help. Negative SLR bilaterally Vascular: Carotid, radial artery, dorsalis pedis and  posterior tibial pulses are full and equal. No bruits present. Neurologic: Alert and oriented x3. Deep tendon reflexes symmetrical and normal.  Gait normal .       Skin: Intact without suspicious lesions or rashes. Lymph: No cervical, axillary lymphadenopathy present. Psych: Mood and affect are normal. Normally interactive                                                                                        Assessment & Plan:  See Current Assessment & Plan in Problem List under specific Diagnosis

## 2014-03-18 LAB — TSH: TSH: 2.6 u[IU]/mL (ref 0.35–4.50)

## 2014-04-02 DIAGNOSIS — M19049 Primary osteoarthritis, unspecified hand: Secondary | ICD-10-CM | POA: Diagnosis not present

## 2014-04-02 DIAGNOSIS — M6530 Trigger finger, unspecified finger: Secondary | ICD-10-CM | POA: Diagnosis not present

## 2014-04-19 ENCOUNTER — Other Ambulatory Visit: Payer: Self-pay | Admitting: Internal Medicine

## 2014-04-29 DIAGNOSIS — N402 Nodular prostate without lower urinary tract symptoms: Secondary | ICD-10-CM | POA: Diagnosis not present

## 2014-04-29 DIAGNOSIS — N281 Cyst of kidney, acquired: Secondary | ICD-10-CM | POA: Diagnosis not present

## 2014-04-29 DIAGNOSIS — N401 Enlarged prostate with lower urinary tract symptoms: Secondary | ICD-10-CM | POA: Diagnosis not present

## 2014-05-06 DIAGNOSIS — Z85820 Personal history of malignant melanoma of skin: Secondary | ICD-10-CM | POA: Diagnosis not present

## 2014-05-06 DIAGNOSIS — L570 Actinic keratosis: Secondary | ICD-10-CM | POA: Diagnosis not present

## 2014-05-06 DIAGNOSIS — D485 Neoplasm of uncertain behavior of skin: Secondary | ICD-10-CM | POA: Diagnosis not present

## 2014-05-06 DIAGNOSIS — D235 Other benign neoplasm of skin of trunk: Secondary | ICD-10-CM | POA: Diagnosis not present

## 2014-05-28 ENCOUNTER — Telehealth: Payer: Self-pay | Admitting: *Deleted

## 2014-05-28 MED ORDER — FINASTERIDE 5 MG PO TABS: ORAL_TABLET | ORAL | Status: DC

## 2014-05-28 NOTE — Telephone Encounter (Signed)
Left msg on triage needing his finasteride sent to [facility] home delivery ASAP. They stated they have fax md twice no response back from office. Called pt back inform him we never received request, but will send electronically to [facility] home delivery...Roger Ingram

## 2014-06-09 ENCOUNTER — Encounter: Payer: Self-pay | Admitting: Internal Medicine

## 2014-06-09 ENCOUNTER — Ambulatory Visit (INDEPENDENT_AMBULATORY_CARE_PROVIDER_SITE_OTHER): Payer: Medicare Other | Admitting: Internal Medicine

## 2014-06-09 VITALS — BP 136/60 | HR 49 | Temp 97.9°F | Wt 148.2 lb

## 2014-06-09 DIAGNOSIS — M7121 Synovial cyst of popliteal space [Baker], right knee: Secondary | ICD-10-CM

## 2014-06-09 DIAGNOSIS — G5621 Lesion of ulnar nerve, right upper limb: Secondary | ICD-10-CM | POA: Diagnosis not present

## 2014-06-09 NOTE — Progress Notes (Signed)
   Subjective:    Patient ID: Roger Ingram, male    DOB: 07/05/1929, 80 y.o.   MRN: [id number]  HPI   While showering 10/25 he noted a mass over the right medial popliteal space. There was no associated pain or discomfort. He had not felt or heard a pop prior to its appearance.    Review of Systems  There is no significant cough, sputum production,hemoptysis, wheezing,or  paroxysmal nocturnal dyspnea.  He has no fever, chills, sweats, weight loss.  He denies pain or swelling in the knee joint.     Objective:   Physical Exam   Pertinent positive findings include: There is a grade 1/5-8 systolic murmur. There is a 4 x 4 centimeter cystic lesion over the right medial popliteal space suggesting a popliteal cyst. Homans sign is negative     General appearance :adequately nourished; in no distress. Eyes: No conjunctival inflammation or scleral icterus is present. Oral exam: Dental hygiene is good. Lips and gums are healthy appearing.There is no oropharyngeal erythema or exudate noted.  Heart:  Normal rate and regular rhythm. S1 and S2 normal without gallop, click, rub or other extra sounds   Lungs:Chest clear to auscultation; no wheezes, rhonchi,rales ,or rubs present.No increased work of breathing.  Abdomen: bowel sounds normal, soft and non-tender without masses, organomegaly or hernias noted.  No guarding or rebound.  Vascular : all pulses equal ; no bruits present. Skin:Warm & dry.  Intact without suspicious lesions or rashes ; no jaundice or tenting Lymphatic: No lymphadenopathy is noted about the head, neck, axilla             Assessment & Plan:  #1 popliteal cyst  Plan: Ultrasound for confirmation.

## 2014-06-09 NOTE — Progress Notes (Signed)
Pre visit review using our clinic review tool, if applicable. No additional management support is needed unless otherwise documented below in the visit note. 

## 2014-06-09 NOTE — Patient Instructions (Signed)
The Ultrasound referral will be scheduled and you'll be notified of the time.Please call the Referral Co-Ordinator @ [phone] if you have not been notified of appointment time within 7-10 days.

## 2014-06-13 ENCOUNTER — Telehealth: Payer: Self-pay | Admitting: Internal Medicine

## 2014-06-13 ENCOUNTER — Ambulatory Visit (HOSPITAL_COMMUNITY): Payer: Medicare Other | Attending: Internal Medicine | Admitting: Cardiology

## 2014-06-13 DIAGNOSIS — M7121 Synovial cyst of popliteal space [Baker], right knee: Secondary | ICD-10-CM

## 2014-06-13 DIAGNOSIS — I10 Essential (primary) hypertension: Secondary | ICD-10-CM | POA: Insufficient documentation

## 2014-06-13 DIAGNOSIS — E785 Hyperlipidemia, unspecified: Secondary | ICD-10-CM | POA: Insufficient documentation

## 2014-06-13 DIAGNOSIS — G5621 Lesion of ulnar nerve, right upper limb: Secondary | ICD-10-CM | POA: Diagnosis not present

## 2014-06-13 DIAGNOSIS — M7989 Other specified soft tissue disorders: Secondary | ICD-10-CM | POA: Diagnosis not present

## 2014-06-13 MED ORDER — TAMSULOSIN HCL 0.4 MG PO CAPS: ORAL_CAPSULE | ORAL | Status: DC

## 2014-06-13 NOTE — Progress Notes (Signed)
Rt. LE Venous duplex performed. 

## 2014-06-13 NOTE — Telephone Encounter (Signed)
Notified pt rx sent to mail service...Roger Ingram

## 2014-06-13 NOTE — Telephone Encounter (Signed)
Pt request refill for FLOMAX to be send to [facility] home delivery. Please advise, pt only has 3 left.

## 2014-06-17 ENCOUNTER — Encounter: Payer: Self-pay | Admitting: Internal Medicine

## 2014-06-17 DIAGNOSIS — M7120 Synovial cyst of popliteal space [Baker], unspecified knee: Secondary | ICD-10-CM | POA: Insufficient documentation

## 2014-06-17 DIAGNOSIS — M7121 Synovial cyst of popliteal space [Baker], right knee: Secondary | ICD-10-CM | POA: Insufficient documentation

## 2014-10-27 ENCOUNTER — Other Ambulatory Visit: Payer: Self-pay | Admitting: Internal Medicine

## 2014-10-30 DIAGNOSIS — M1612 Unilateral primary osteoarthritis, left hip: Secondary | ICD-10-CM | POA: Diagnosis not present

## 2014-11-13 ENCOUNTER — Telehealth: Payer: Self-pay | Admitting: Internal Medicine

## 2014-11-13 ENCOUNTER — Encounter: Payer: Self-pay | Admitting: Internal Medicine

## 2014-11-13 ENCOUNTER — Ambulatory Visit (INDEPENDENT_AMBULATORY_CARE_PROVIDER_SITE_OTHER): Payer: Medicare Other | Admitting: Internal Medicine

## 2014-11-13 VITALS — BP 140/76 | HR 55 | Temp 98.1°F | Ht 68.0 in | Wt 148.2 lb

## 2014-11-13 DIAGNOSIS — M1612 Unilateral primary osteoarthritis, left hip: Secondary | ICD-10-CM

## 2014-11-13 DIAGNOSIS — Z86010 Personal history of colonic polyps: Secondary | ICD-10-CM

## 2014-11-13 DIAGNOSIS — E785 Hyperlipidemia, unspecified: Secondary | ICD-10-CM

## 2014-11-13 DIAGNOSIS — M169 Osteoarthritis of hip, unspecified: Secondary | ICD-10-CM | POA: Insufficient documentation

## 2014-11-13 DIAGNOSIS — K5730 Diverticulosis of large intestine without perforation or abscess without bleeding: Secondary | ICD-10-CM

## 2014-11-13 DIAGNOSIS — I10 Essential (primary) hypertension: Secondary | ICD-10-CM | POA: Diagnosis not present

## 2014-11-13 NOTE — Patient Instructions (Addendum)
You are medically cleared for surgery. Dr Trevor Mace office will be notified.

## 2014-11-13 NOTE — Assessment & Plan Note (Signed)
Lipids were at goal in August 2015. No change indicated.

## 2014-11-13 NOTE — Assessment & Plan Note (Signed)
F/U as per Dr Earlean Shawl

## 2014-11-13 NOTE — Assessment & Plan Note (Signed)
Blood pressure goals reviewed. BMET was WNL 8/15 except for K+ 5.4

## 2014-11-13 NOTE — Assessment & Plan Note (Signed)
Jean Rosenthal MD,[facility] Cleared for surgery

## 2014-11-13 NOTE — Progress Notes (Signed)
Pre visit review using our clinic review tool, if applicable. No additional management support is needed unless otherwise documented below in the visit note. 

## 2014-11-13 NOTE — Telephone Encounter (Signed)
emmi mailed  °

## 2014-11-13 NOTE — Progress Notes (Signed)
   Subjective:    Patient ID: Roger Ingram, male    DOB: Apr 08, 1929, 81 y.o.   MRN: [id number]  HPI He is to be seen by Dr. Jean Rosenthal at [facility] 11/20/14 as a prelude to total left hip replacement. He's had progressive pain in this area and debilitation. He describes a dull ache which becomes sharp when he ambulates as the hip "locks". Pain is up to level VII. He previously walked 30 minutes twice a day but is no longer able to do this because of the pain. He did have a steroid injection in 2012 which was effective. He's now taking Advil 2 times a day with moderate relief.  He has been compliant with his blood pressure medicines and has no active cardio pulmonary symptoms.BP usually is in 120s/low 70s . BP meds not taken as yet today.  Review of Systems He denies numbness, tingling, or weakness in the lower extremity.  He has no fever, chills, sweats, weight loss  There is no incontinence of urine or stool.  Chest pain, palpitations, tachycardia, exertional dyspnea, paroxysmal nocturnal dyspnea, claudication or edema are absent.  He has occasional left lower quadrant discomfort without associated fever, chills, or stool changes. He is followed by Dr. Medoff,Gastroenterologist      Objective:   Physical Exam Pertinent or positive findings include: There is scarring of the right tympanic membrane related to previous trauma.  He has a grade 1/2 systolic murmur at the left sternal border. He has pain with elevation of the left lower extremity & with rotation of the hip.  He has some keratotic changes and ecchymosis over the dorsum of the hands.  General appearance :adequately nourished; in no distress.Moustache. Eyes: No conjunctival inflammation or scleral icterus is present. Oral exam:  Lips and gums are healthy appearing.There is no oropharyngeal erythema or exudate noted. Dental hygiene is good. Heart:  Normal rate and regular rhythm. S1 and S2 normal without  gallop, click, rub or other extra sounds   Lungs:Chest clear to auscultation; no wheezes, rhonchi,rales ,or rubs present.No increased work of breathing.  Abdomen: bowel sounds normal, soft and non-tender without masses, organomegaly or hernias noted.  No guarding or rebound.  Vascular : all pulses equal ; no bruits present. Skin:Warm & dry.  Intact without suspicious lesions or rashes ; no tenting or jaundice  Lymphatic: No lymphadenopathy is noted about the head, neck, axilla Neuro: Strength, tone & DTRs normal.       Assessment & Plan:  See Current Assessment & Plan in Problem List under specific Diagnosis

## 2014-11-13 NOTE — Assessment & Plan Note (Signed)
Dietary interventions with any left lower quadrant symptoms discussed.

## 2014-11-17 DIAGNOSIS — Z85828 Personal history of other malignant neoplasm of skin: Secondary | ICD-10-CM | POA: Diagnosis not present

## 2014-11-17 DIAGNOSIS — L814 Other melanin hyperpigmentation: Secondary | ICD-10-CM | POA: Diagnosis not present

## 2014-11-17 DIAGNOSIS — Z08 Encounter for follow-up examination after completed treatment for malignant neoplasm: Secondary | ICD-10-CM | POA: Diagnosis not present

## 2014-11-17 DIAGNOSIS — L820 Inflamed seborrheic keratosis: Secondary | ICD-10-CM | POA: Diagnosis not present

## 2014-11-17 DIAGNOSIS — L570 Actinic keratosis: Secondary | ICD-10-CM | POA: Diagnosis not present

## 2014-11-17 DIAGNOSIS — Z85820 Personal history of malignant melanoma of skin: Secondary | ICD-10-CM | POA: Diagnosis not present

## 2014-11-20 DIAGNOSIS — M1612 Unilateral primary osteoarthritis, left hip: Secondary | ICD-10-CM | POA: Diagnosis not present

## 2014-11-21 ENCOUNTER — Other Ambulatory Visit (HOSPITAL_COMMUNITY): Payer: Self-pay | Admitting: Orthopaedic Surgery

## 2014-12-08 ENCOUNTER — Other Ambulatory Visit (HOSPITAL_COMMUNITY): Payer: Self-pay | Admitting: *Deleted

## 2014-12-09 ENCOUNTER — Encounter (HOSPITAL_COMMUNITY): Payer: Self-pay

## 2014-12-09 ENCOUNTER — Encounter (HOSPITAL_COMMUNITY)
Admission: RE | Admit: 2014-12-09 | Discharge: 2014-12-09 | Disposition: A | Discharge status: Home / Self Care | Payer: Medicare Other | Source: Ambulatory Visit | Attending: Orthopaedic Surgery | Admitting: Orthopaedic Surgery

## 2014-12-09 DIAGNOSIS — Z01810 Encounter for preprocedural cardiovascular examination: Secondary | ICD-10-CM | POA: Insufficient documentation

## 2014-12-09 DIAGNOSIS — M1612 Unilateral primary osteoarthritis, left hip: Secondary | ICD-10-CM | POA: Insufficient documentation

## 2014-12-09 DIAGNOSIS — Z01812 Encounter for preprocedural laboratory examination: Secondary | ICD-10-CM | POA: Diagnosis not present

## 2014-12-09 HISTORY — DX: Personal history of urinary calculi: Z87.442

## 2014-12-09 LAB — BASIC METABOLIC PANEL
ANION GAP: 6 (ref 5–15)
BUN: 30 mg/dL — ABNORMAL HIGH (ref 6–23)
CHLORIDE: 106 mmol/L (ref 96–112)
CO2: 25 mmol/L (ref 19–32)
CREATININE: 1.2 mg/dL (ref 0.50–1.35)
Calcium: 9.1 mg/dL (ref 8.4–10.5)
GFR calc Af Amer: 62 mL/min — ABNORMAL LOW (ref >90)
GFR calc non Af Amer: 54 mL/min — ABNORMAL LOW (ref >90)
GLUCOSE: 99 mg/dL (ref 70–99)
Potassium: 5.1 mmol/L (ref 3.5–5.1)
Sodium: 137 mmol/L (ref 135–145)

## 2014-12-09 LAB — CBC
HEMATOCRIT: 40.1 % (ref 39.0–52.0)
HEMOGLOBIN: 13.2 g/dL (ref 13.0–17.0)
MCH: 29.9 pg (ref 26.0–34.0)
MCHC: 32.9 g/dL (ref 30.0–36.0)
MCV: 90.7 fL (ref 78.0–100.0)
Platelets: 235 10*3/uL (ref 150–400)
RBC: 4.42 MIL/uL (ref 4.22–5.81)
RDW: 12.9 % (ref 11.5–15.5)
WBC: 6.8 10*3/uL (ref 4.0–10.5)

## 2014-12-09 LAB — SURGICAL PCR SCREEN
MRSA, PCR: NEGATIVE
Staphylococcus aureus: NEGATIVE

## 2014-12-09 LAB — APTT: aPTT: 29 seconds (ref 24–37)

## 2014-12-09 LAB — PROTIME-INR
INR: 1 (ref 0.00–1.49)
PROTHROMBIN TIME: 13.3 s (ref 11.6–15.2)

## 2014-12-09 LAB — ABO/RH: ABO/RH(D): B POS

## 2014-12-09 NOTE — Progress Notes (Signed)
12-09-14 1630 correction to prior note. Pt does not have history of renal insufficiency.

## 2014-12-09 NOTE — Patient Instructions (Signed)
[place]  12/09/2014   Your procedure is scheduled on:   12-19-2014 Friday  Enter through [facility]  Entrance and follow signs to [facility]. Arrive at      1215  PM.  Call this number if you have problems the morning of surgery: [phone]  Or Presurgical Testing [phone].   For Living Will and/or Health Care Power Attorney Forms: please provide copy for your medical record,may bring AM of surgery(Forms should be already notarized -we do not provide this service).(Yes/  information provided  Today and in Epic Chart).      Do not eat food/ or drink: After Midnight.  Exception: may have clear liquids:up to 6 Hours before arrival. Nothing after: 0800 AM.  Clear liquids include soda, tea, black coffee, apple or grape juice, broth.  Take these medicines the morning of surgery with A SIP OF WATER: Propranolol. (reminder- on arrival you will be given Amlodipine on arrival as a substitute for Lotrel)   Do not wear jewelry, make-up or nail polish.  Do not wear deodorant, lotions, powders, or perfumes.   Do not shave legs and under arms- 48 hours(2 days) prior to first CHG shower.(Shaving face and neck okay.)  Do not bring valuables to the hospital.(Hospital is not responsible for lost valuables).  Contacts, dentures or removable bridgework, body piercing, hair pins may not be worn into surgery.  Leave suitcase in the car. After surgery it may be brought to your room.  For patients admitted to the hospital, checkout time is 11:00 AM the day of discharge.(Restricted visitors-Any Persons displaying flu-like symptoms or illness).    Patients discharged the day of surgery will not be allowed to drive home. Must have responsible person with you x 24 hours once discharged.  Name and phone number of your driver: Roger Ingram, spouse [phone] cell     Please read over the following fact sheets that you were given:  CHG(Chlorhexidine Gluconate 4% Surgical  Soap) use, MRSA Information..  Remember : Type/Screen "Blue armbands" - may not be removed once applied(would result in being retested AM of surgery, if removed).         [place] - Preparing for Surgery Before surgery, you can play an important role.  Because skin is not sterile, your skin needs to be as free of germs as possible.  You can reduce the number of germs on your skin by washing with CHG (chlorahexidine gluconate) soap before surgery.  CHG is an antiseptic cleaner which kills germs and bonds with the skin to continue killing germs even after washing. Please DO NOT use if you have an allergy to CHG or antibacterial soaps.  If your skin becomes reddened/irritated stop using the CHG and inform your nurse when you arrive at Short Stay. Do not shave (including legs and underarms) for at least 48 hours prior to the first CHG shower.  You may shave your face/neck. Please follow these instructions carefully:  1.  Shower with CHG Soap the night before surgery and the  morning of Surgery.  2.  If you choose to wash your hair, wash your hair first as usual with your  normal  shampoo.  3.  After you shampoo, rinse your hair and body thoroughly to remove the  shampoo.                           4.  Use CHG as you would  any other liquid soap.  You can apply chg directly  to the skin and wash                       Gently with a scrungie or clean washcloth.  5.  Apply the CHG Soap to your body ONLY FROM THE NECK DOWN.   Do not use on face/ open                           Wound or open sores. Avoid contact with eyes, ears mouth and genitals (private parts).                       Wash face,  Genitals (private parts) with your normal soap.             6.  Wash thoroughly, paying special attention to the area where your surgery  will be performed.  7.  Thoroughly rinse your body with warm water from the neck down.  8.  DO NOT shower/wash with your normal soap after using and rinsing off  the CHG  Soap.                9.  Pat yourself dry with a clean towel.            10.  Wear clean pajamas.            11.  Place clean sheets on your bed the night of your first shower and do not  sleep with pets. Day of Surgery : Do not apply any lotions/deodorants the morning of surgery.  Please wear clean clothes to the hospital/surgery center.  FAILURE TO FOLLOW THESE INSTRUCTIONS MAY RESULT IN THE CANCELLATION OF YOUR SURGERY PATIENT SIGNATURE_________________________________  NURSE SIGNATURE__________________________________  ________________________________________________________________________

## 2014-12-09 NOTE — Progress Notes (Signed)
12-09-14 1550 Labs viewable in [place], note BUN-history of renal insufficiency.

## 2014-12-09 NOTE — Pre-Procedure Instructions (Addendum)
12-09-14 EKG done today. 12-09-14 1550 Labs viewable  In Epic, note sent to Dr. Whitney Muse office to note BUN.

## 2014-12-19 ENCOUNTER — Inpatient Hospital Stay (HOSPITAL_COMMUNITY): Payer: Medicare Other | Admitting: Anesthesiology

## 2014-12-19 ENCOUNTER — Inpatient Hospital Stay (HOSPITAL_COMMUNITY): Payer: Medicare Other

## 2014-12-19 ENCOUNTER — Encounter (HOSPITAL_COMMUNITY): Payer: Self-pay | Admitting: *Deleted

## 2014-12-19 ENCOUNTER — Encounter (HOSPITAL_COMMUNITY)
Admission: RE | Disposition: A | Discharge status: Home Health | Payer: Self-pay | Source: Ambulatory Visit | Attending: Orthopaedic Surgery

## 2014-12-19 ENCOUNTER — Inpatient Hospital Stay (HOSPITAL_COMMUNITY)
Admission: RE | Admit: 2014-12-19 | Discharge: 2014-12-21 | DRG: 470 | Disposition: A | Discharge status: Home Health | Payer: Medicare Other | Source: Ambulatory Visit | Attending: Orthopaedic Surgery | Admitting: Orthopaedic Surgery

## 2014-12-19 DIAGNOSIS — Z85820 Personal history of malignant melanoma of skin: Secondary | ICD-10-CM

## 2014-12-19 DIAGNOSIS — Z823 Family history of stroke: Secondary | ICD-10-CM

## 2014-12-19 DIAGNOSIS — Z01812 Encounter for preprocedural laboratory examination: Secondary | ICD-10-CM

## 2014-12-19 DIAGNOSIS — R112 Nausea with vomiting, unspecified: Secondary | ICD-10-CM | POA: Diagnosis not present

## 2014-12-19 DIAGNOSIS — Z833 Family history of diabetes mellitus: Secondary | ICD-10-CM

## 2014-12-19 DIAGNOSIS — E785 Hyperlipidemia, unspecified: Secondary | ICD-10-CM | POA: Diagnosis present

## 2014-12-19 DIAGNOSIS — Z800 Family history of malignant neoplasm of digestive organs: Secondary | ICD-10-CM | POA: Diagnosis not present

## 2014-12-19 DIAGNOSIS — M1612 Unilateral primary osteoarthritis, left hip: Principal | ICD-10-CM

## 2014-12-19 DIAGNOSIS — Z471 Aftercare following joint replacement surgery: Secondary | ICD-10-CM | POA: Diagnosis not present

## 2014-12-19 DIAGNOSIS — Z86010 Personal history of colonic polyps: Secondary | ICD-10-CM

## 2014-12-19 DIAGNOSIS — Z87442 Personal history of urinary calculi: Secondary | ICD-10-CM

## 2014-12-19 DIAGNOSIS — Z96642 Presence of left artificial hip joint: Secondary | ICD-10-CM

## 2014-12-19 DIAGNOSIS — I10 Essential (primary) hypertension: Secondary | ICD-10-CM | POA: Diagnosis not present

## 2014-12-19 DIAGNOSIS — Z8249 Family history of ischemic heart disease and other diseases of the circulatory system: Secondary | ICD-10-CM | POA: Diagnosis not present

## 2014-12-19 DIAGNOSIS — D62 Acute posthemorrhagic anemia: Secondary | ICD-10-CM | POA: Diagnosis not present

## 2014-12-19 DIAGNOSIS — M25552 Pain in left hip: Secondary | ICD-10-CM | POA: Diagnosis not present

## 2014-12-19 DIAGNOSIS — Z419 Encounter for procedure for purposes other than remedying health state, unspecified: Secondary | ICD-10-CM

## 2014-12-19 HISTORY — PX: TOTAL HIP ARTHROPLASTY: SHX124

## 2014-12-19 HISTORY — DX: Chronic kidney disease, unspecified: N18.9

## 2014-12-19 LAB — TYPE AND SCREEN
ABO/RH(D): B POS
Antibody Screen: NEGATIVE

## 2014-12-19 SURGERY — ARTHROPLASTY, HIP, TOTAL, ANTERIOR APPROACH
Anesthesia: Spinal | Site: Hip | Laterality: Left

## 2014-12-19 MED ORDER — METOCLOPRAMIDE HCL 5 MG/ML IJ SOLN
5.0000 mg | Freq: Three times a day (TID) | INTRAMUSCULAR | Status: DC | PRN
  Administered 2014-12-20: 10 mg via INTRAVENOUS
  Filled 2014-12-19: qty 2

## 2014-12-19 MED ORDER — SODIUM CHLORIDE 0.9 % IV SOLN
INTRAVENOUS | Status: DC
  Administered 2014-12-19: 22:00:00 via INTRAVENOUS

## 2014-12-19 MED ORDER — CEFAZOLIN SODIUM-DEXTROSE 2-3 GM-% IV SOLR
INTRAVENOUS | Status: AC
  Filled 2014-12-19: qty 50

## 2014-12-19 MED ORDER — FENTANYL CITRATE (PF) 100 MCG/2ML IJ SOLN
INTRAMUSCULAR | Status: AC
  Filled 2014-12-19: qty 2

## 2014-12-19 MED ORDER — PROPOFOL 10 MG/ML IV BOLUS
INTRAVENOUS | Status: DC | PRN
Start: 2014-12-19 — End: 2014-12-19
  Administered 2014-12-19: 30 mg via INTRAVENOUS

## 2014-12-19 MED ORDER — BUPIVACAINE IN DEXTROSE 0.75-8.25 % IT SOLN
INTRATHECAL | Status: DC | PRN
  Administered 2014-12-19: 1.8 mL via INTRATHECAL

## 2014-12-19 MED ORDER — ASPIRIN EC 325 MG PO TBEC
325.0000 mg | DELAYED_RELEASE_TABLET | Freq: Two times a day (BID) | ORAL | Status: DC
  Administered 2014-12-20 – 2014-12-21 (×3): 325 mg via ORAL
  Filled 2014-12-19 (×5): qty 1

## 2014-12-19 MED ORDER — FINASTERIDE 5 MG PO TABS
5.0000 mg | ORAL_TABLET | Freq: Every day | ORAL | Status: DC
  Administered 2014-12-20 – 2014-12-21 (×2): 5 mg via ORAL
  Filled 2014-12-19 (×2): qty 1

## 2014-12-19 MED ORDER — MENTHOL 3 MG MT LOZG: 1.0000 | LOZENGE | OROMUCOSAL | Status: DC | PRN

## 2014-12-19 MED ORDER — PHENYLEPHRINE HCL 10 MG/ML IJ SOLN
INTRAMUSCULAR | Status: AC
  Filled 2014-12-19: qty 1

## 2014-12-19 MED ORDER — PROPRANOLOL HCL 10 MG PO TABS
10.0000 mg | ORAL_TABLET | Freq: Two times a day (BID) | ORAL | Status: DC
  Administered 2014-12-19 – 2014-12-20 (×2): 10 mg via ORAL
  Filled 2014-12-19 (×6): qty 1

## 2014-12-19 MED ORDER — TRANEXAMIC ACID 1000 MG/10ML IV SOLN
1000.0000 mg | INTRAVENOUS | Status: AC
  Administered 2014-12-19: 1000 mg via INTRAVENOUS
  Filled 2014-12-19: qty 10

## 2014-12-19 MED ORDER — SODIUM CHLORIDE 0.9 % IR SOLN
Status: DC | PRN
  Administered 2014-12-19: 1000 mL

## 2014-12-19 MED ORDER — CEFAZOLIN SODIUM-DEXTROSE 2-3 GM-% IV SOLR
2.0000 g | INTRAVENOUS | Status: AC
  Administered 2014-12-19: 2 g via INTRAVENOUS

## 2014-12-19 MED ORDER — ACETAMINOPHEN 10 MG/ML IV SOLN
1000.0000 mg | Freq: Once | INTRAVENOUS | Status: AC
  Administered 2014-12-19: 1000 mg via INTRAVENOUS
  Filled 2014-12-19: qty 100

## 2014-12-19 MED ORDER — FENTANYL CITRATE (PF) 100 MCG/2ML IJ SOLN
25.0000 ug | INTRAMUSCULAR | Status: DC | PRN
  Administered 2014-12-19 (×3): 25 ug via INTRAVENOUS

## 2014-12-19 MED ORDER — AMLODIPINE BESY-BENAZEPRIL HCL 10-20 MG PO CAPS: 1.0000 | ORAL_CAPSULE | Freq: Every day | ORAL | Status: DC

## 2014-12-19 MED ORDER — ACETAMINOPHEN 650 MG RE SUPP: 650.0000 mg | Freq: Four times a day (QID) | RECTAL | Status: DC | PRN

## 2014-12-19 MED ORDER — ALBUMIN HUMAN 5 % IV SOLN
INTRAVENOUS | Status: AC
  Filled 2014-12-19: qty 250

## 2014-12-19 MED ORDER — FENTANYL CITRATE (PF) 100 MCG/2ML IJ SOLN
INTRAMUSCULAR | Status: DC | PRN
Start: 2014-12-19 — End: 2014-12-19
  Administered 2014-12-19: 50 ug via INTRAVENOUS

## 2014-12-19 MED ORDER — DIPHENHYDRAMINE HCL 12.5 MG/5ML PO ELIX: 12.5000 mg | ORAL_SOLUTION | ORAL | Status: DC | PRN

## 2014-12-19 MED ORDER — BENAZEPRIL HCL 20 MG PO TABS
20.0000 mg | ORAL_TABLET | Freq: Every day | ORAL | Status: DC
  Administered 2014-12-20: 20 mg via ORAL
  Filled 2014-12-19 (×2): qty 1

## 2014-12-19 MED ORDER — AMLODIPINE BESYLATE 10 MG PO TABS
10.0000 mg | ORAL_TABLET | Freq: Every day | ORAL | Status: DC
  Administered 2014-12-19: 10 mg via ORAL
  Filled 2014-12-19 (×2): qty 1

## 2014-12-19 MED ORDER — PHENOL 1.4 % MT LIQD
1.0000 | OROMUCOSAL | Status: DC | PRN
  Filled 2014-12-19: qty 177

## 2014-12-19 MED ORDER — ALBUMIN HUMAN 5 % IV SOLN
INTRAVENOUS | Status: DC | PRN
  Administered 2014-12-19: 17:00:00 via INTRAVENOUS

## 2014-12-19 MED ORDER — LACTATED RINGERS IV SOLN
INTRAVENOUS | Status: DC
  Administered 2014-12-19: 17:00:00 via INTRAVENOUS
  Administered 2014-12-19: 1000 mL via INTRAVENOUS
  Administered 2014-12-19: 16:00:00 via INTRAVENOUS

## 2014-12-19 MED ORDER — ZOLPIDEM TARTRATE 5 MG PO TABS: 5.0000 mg | ORAL_TABLET | Freq: Every evening | ORAL | Status: DC | PRN

## 2014-12-19 MED ORDER — METHOCARBAMOL 500 MG PO TABS: 500.0000 mg | ORAL_TABLET | Freq: Four times a day (QID) | ORAL | Status: DC | PRN

## 2014-12-19 MED ORDER — PHENYLEPHRINE HCL 10 MG/ML IJ SOLN
10.0000 mg | INTRAVENOUS | Status: DC | PRN
  Administered 2014-12-19: 10 ug/min via INTRAVENOUS

## 2014-12-19 MED ORDER — TRAMADOL HCL 50 MG PO TABS: 100.0000 mg | ORAL_TABLET | Freq: Four times a day (QID) | ORAL | Status: DC | PRN

## 2014-12-19 MED ORDER — ACETAMINOPHEN 325 MG PO TABS: 650.0000 mg | ORAL_TABLET | Freq: Four times a day (QID) | ORAL | Status: DC | PRN

## 2014-12-19 MED ORDER — DEXAMETHASONE SODIUM PHOSPHATE 10 MG/ML IJ SOLN
INTRAMUSCULAR | Status: AC
  Filled 2014-12-19: qty 1

## 2014-12-19 MED ORDER — OXYCODONE HCL 5 MG PO TABS
5.0000 mg | ORAL_TABLET | ORAL | Status: DC | PRN
  Administered 2014-12-19 – 2014-12-21 (×9): 10 mg via ORAL
  Filled 2014-12-19 (×9): qty 2

## 2014-12-19 MED ORDER — ALUM & MAG HYDROXIDE-SIMETH 200-200-20 MG/5ML PO SUSP: 30.0000 mL | ORAL | Status: DC | PRN

## 2014-12-19 MED ORDER — METOCLOPRAMIDE HCL 10 MG PO TABS
5.0000 mg | ORAL_TABLET | Freq: Three times a day (TID) | ORAL | Status: DC | PRN
  Administered 2014-12-20: 10 mg via ORAL
  Filled 2014-12-19: qty 1

## 2014-12-19 MED ORDER — MIDAZOLAM HCL 5 MG/5ML IJ SOLN
INTRAMUSCULAR | Status: DC | PRN
  Administered 2014-12-19: 1 mg via INTRAVENOUS

## 2014-12-19 MED ORDER — FOLIC ACID 0.5 MG HALF TAB
500.0000 ug | ORAL_TABLET | Freq: Every day | ORAL | Status: DC
  Administered 2014-12-20 – 2014-12-21 (×2): 0.5 mg via ORAL
  Filled 2014-12-19 (×2): qty 1

## 2014-12-19 MED ORDER — DOCUSATE SODIUM 100 MG PO CAPS
100.0000 mg | ORAL_CAPSULE | Freq: Two times a day (BID) | ORAL | Status: DC
  Administered 2014-12-19 – 2014-12-21 (×4): 100 mg via ORAL

## 2014-12-19 MED ORDER — DEXAMETHASONE SODIUM PHOSPHATE 10 MG/ML IJ SOLN
INTRAMUSCULAR | Status: DC | PRN
  Administered 2014-12-19: 10 mg via INTRAVENOUS

## 2014-12-19 MED ORDER — ONDANSETRON HCL 4 MG PO TABS: 4.0000 mg | ORAL_TABLET | Freq: Four times a day (QID) | ORAL | Status: DC | PRN

## 2014-12-19 MED ORDER — ONDANSETRON HCL 4 MG/2ML IJ SOLN
4.0000 mg | Freq: Four times a day (QID) | INTRAMUSCULAR | Status: DC | PRN
  Administered 2014-12-20 (×2): 4 mg via INTRAVENOUS
  Filled 2014-12-19 (×2): qty 2

## 2014-12-19 MED ORDER — AMLODIPINE BESYLATE 10 MG PO TABS
10.0000 mg | ORAL_TABLET | Freq: Every day | ORAL | Status: DC
  Administered 2014-12-20: 10 mg via ORAL
  Filled 2014-12-19 (×2): qty 1

## 2014-12-19 MED ORDER — METHOCARBAMOL 1000 MG/10ML IJ SOLN
500.0000 mg | Freq: Four times a day (QID) | INTRAVENOUS | Status: DC | PRN
  Administered 2014-12-19: 500 mg via INTRAVENOUS
  Filled 2014-12-19 (×3): qty 5

## 2014-12-19 MED ORDER — PROPOFOL 10 MG/ML IV BOLUS
INTRAVENOUS | Status: AC
  Filled 2014-12-19: qty 40

## 2014-12-19 MED ORDER — HYDROMORPHONE HCL 1 MG/ML IJ SOLN: 0.5000 mg | INTRAMUSCULAR | Status: DC | PRN

## 2014-12-19 MED ORDER — CEFAZOLIN SODIUM 1-5 GM-% IV SOLN
1.0000 g | Freq: Four times a day (QID) | INTRAVENOUS | Status: AC
  Administered 2014-12-19 – 2014-12-20 (×2): 1 g via INTRAVENOUS
  Filled 2014-12-19 (×2): qty 50

## 2014-12-19 MED ORDER — PROPOFOL INFUSION 10 MG/ML OPTIME
INTRAVENOUS | Status: DC | PRN
  Administered 2014-12-19: 50 ug/kg/min via INTRAVENOUS

## 2014-12-19 MED ORDER — GLYCOPYRROLATE 0.2 MG/ML IJ SOLN
INTRAMUSCULAR | Status: DC | PRN
  Administered 2014-12-19: 0.2 mg via INTRAVENOUS

## 2014-12-19 MED ORDER — MIDAZOLAM HCL 2 MG/2ML IJ SOLN
INTRAMUSCULAR | Status: AC
  Filled 2014-12-19: qty 2

## 2014-12-19 MED ORDER — PROPOFOL 10 MG/ML IV BOLUS
INTRAVENOUS | Status: AC
  Filled 2014-12-19: qty 20

## 2014-12-19 MED ORDER — GLYCOPYRROLATE 0.2 MG/ML IJ SOLN
INTRAMUSCULAR | Status: AC
  Filled 2014-12-19: qty 1

## 2014-12-19 MED ORDER — TAMSULOSIN HCL 0.4 MG PO CAPS
0.4000 mg | ORAL_CAPSULE | Freq: Every day | ORAL | Status: DC
  Administered 2014-12-20 – 2014-12-21 (×2): 0.4 mg via ORAL
  Filled 2014-12-19 (×3): qty 1

## 2014-12-19 SURGICAL SUPPLY — 46 items
APL SKNCLS STERI-STRIP NONHPOA (GAUZE/BANDAGES/DRESSINGS) ×1
BAG SPEC THK2 15X12 ZIP CLS (MISCELLANEOUS)
BAG ZIPLOCK 12X15 (MISCELLANEOUS) IMPLANT
BENZOIN TINCTURE PRP APPL 2/3 (GAUZE/BANDAGES/DRESSINGS) ×2 IMPLANT
BLADE SAW SGTL 18X1.27X75 (BLADE) ×2 IMPLANT
BLADE SAW SGTL 18X1.27X75MM (BLADE) ×1
CAPT HIP TOTAL 2 ×2 IMPLANT
CELLS DAT CNTRL 66122 CELL SVR (MISCELLANEOUS) ×1 IMPLANT
CLOSURE WOUND 1/2 X4 (GAUZE/BANDAGES/DRESSINGS) ×1
COVER PERINEAL POST (MISCELLANEOUS) ×3 IMPLANT
DRAPE C-ARM 42X120 X-RAY (DRAPES) ×3 IMPLANT
DRAPE STERI IOBAN 125X83 (DRAPES) ×3 IMPLANT
DRAPE U-SHAPE 47X51 STRL (DRAPES) ×9 IMPLANT
DRSG AQUACEL AG ADV 3.5X10 (GAUZE/BANDAGES/DRESSINGS) ×3 IMPLANT
DURAPREP 26ML APPLICATOR (WOUND CARE) ×3 IMPLANT
ELECT BLADE TIP CTD 4 INCH (ELECTRODE) ×3 IMPLANT
ELECT REM PT RETURN 9FT ADLT (ELECTROSURGICAL) ×3
ELECTRODE REM PT RTRN 9FT ADLT (ELECTROSURGICAL) ×1 IMPLANT
FACESHIELD WRAPAROUND (MASK) ×12 IMPLANT
FACESHIELD WRAPAROUND OR TEAM (MASK) ×4 IMPLANT
GAUZE XEROFORM 1X8 LF (GAUZE/BANDAGES/DRESSINGS) IMPLANT
GLOVE BIO SURGEON STRL SZ7.5 (GLOVE) ×3 IMPLANT
GLOVE BIOGEL PI IND STRL 8 (GLOVE) ×2 IMPLANT
GLOVE BIOGEL PI INDICATOR 8 (GLOVE) ×4
GLOVE ECLIPSE 8.0 STRL XLNG CF (GLOVE) ×3 IMPLANT
GOWN STRL REUS W/TWL XL LVL3 (GOWN DISPOSABLE) ×6 IMPLANT
HANDPIECE INTERPULSE COAX TIP (DISPOSABLE) ×3
KIT BASIN OR (CUSTOM PROCEDURE TRAY) ×3 IMPLANT
PACK TOTAL JOINT (CUSTOM PROCEDURE TRAY) ×3 IMPLANT
PEN SKIN MARKING BROAD (MISCELLANEOUS) ×3 IMPLANT
RETRACTOR WND ALEXIS 18 MED (MISCELLANEOUS) ×1 IMPLANT
RTRCTR WOUND ALEXIS 18CM MED (MISCELLANEOUS) ×3
SET HNDPC FAN SPRY TIP SCT (DISPOSABLE) ×1 IMPLANT
STAPLER VISISTAT 35W (STAPLE) IMPLANT
STRIP CLOSURE SKIN 1/2X4 (GAUZE/BANDAGES/DRESSINGS) ×1 IMPLANT
SUT ETHIBOND NAB CT1 #1 30IN (SUTURE) ×5 IMPLANT
SUT MNCRL AB 4-0 PS2 18 (SUTURE) ×2 IMPLANT
SUT VIC AB 0 CT1 36 (SUTURE) ×3 IMPLANT
SUT VIC AB 1 CT1 36 (SUTURE) ×3 IMPLANT
SUT VIC AB 2-0 CT1 27 (SUTURE) ×6
SUT VIC AB 2-0 CT1 TAPERPNT 27 (SUTURE) ×2 IMPLANT
TOWEL OR 17X26 10 PK STRL BLUE (TOWEL DISPOSABLE) ×3 IMPLANT
TOWEL OR NON WOVEN STRL DISP B (DISPOSABLE) ×3 IMPLANT
TRAY FOLEY CATH 16FRSI W/METER (SET/KITS/TRAYS/PACK) ×2 IMPLANT
TRAY FOLEY W/METER SILVER 14FR (SET/KITS/TRAYS/PACK) ×1 IMPLANT
YANKAUER SUCT BULB TIP 10FT TU (MISCELLANEOUS) ×3 IMPLANT

## 2014-12-19 NOTE — Transfer of Care (Signed)
Immediate Anesthesia Transfer of Care Note  Patient: Roger Ingram  Procedure(s) Performed: Procedure(s): LEFT TOTAL HIP ARTHROPLASTY ANTERIOR APPROACH (Left)  Patient Location: PACU  Anesthesia Type:Spinal  Level of Consciousness: awake and patient cooperative  Airway & Oxygen Therapy: Patient Spontanous Breathing and Patient connected to face mask oxygen  Post-op Assessment: Report given to RN and Post -op Vital signs reviewed and stable  Post vital signs: stable  Last Vitals:  Filed Vitals:   12/19/14 1136  BP: 136/71  Pulse: 62  Temp: 36.3 C  Resp: 18    Complications: No apparent anesthesia complications

## 2014-12-19 NOTE — Brief Op Note (Signed)
12/19/2014  4:33 PM  PATIENT:  Roger Ingram  81 y.o. male  PRE-OPERATIVE DIAGNOSIS:  primary osteoarthritis left hip  POST-OPERATIVE DIAGNOSIS:  primary osteoarthritis left hip  PROCEDURE:  Procedure(s): LEFT TOTAL HIP ARTHROPLASTY ANTERIOR APPROACH (Left)  SURGEON:  Surgeon(s) and Role:    * Mcarthur Rossetti, MD - Primary  PHYSICIAN ASSISTANT: Benita Stabile, PA-C  ANESTHESIA:   spinal  EBL:  Total I/O In: 1000 [I.V.:1000] Out: 350 [Urine:200; Blood:150]  BLOOD ADMINISTERED:none  DRAINS: none   LOCAL MEDICATIONS USED:  NONE  SPECIMEN:  No Specimen  DISPOSITION OF SPECIMEN:  N/A  COUNTS:  YES  TOURNIQUET:  * No tourniquets in log *  DICTATION: .Other Dictation: Dictation Number [id number]  PLAN OF CARE: Admit to inpatient   PATIENT DISPOSITION:  PACU - hemodynamically stable.   Delay start of Pharmacological VTE agent (>24hrs) due to surgical blood loss or risk of bleeding: no

## 2014-12-19 NOTE — Anesthesia Preprocedure Evaluation (Addendum)
Anesthesia Evaluation  Patient identified by MRN, date of birth, ID band Patient awake    Reviewed: Allergy & Precautions, NPO status   Airway Mallampati: II  TM Distance: >3 FB Neck ROM: Full    Dental   Pulmonary neg pulmonary ROS,  breath sounds clear to auscultation        Cardiovascular hypertension, Rhythm:Regular Rate:Normal     Neuro/Psych    GI/Hepatic negative GI ROS, Neg liver ROS,   Endo/Other    Renal/GU Renal disease     Musculoskeletal  (+) Arthritis -,   Abdominal   Peds  Hematology   Anesthesia Other Findings   Reproductive/Obstetrics                          Anesthesia Physical Anesthesia Plan  ASA: III  Anesthesia Plan: Spinal   Post-op Pain Management:    Induction: Intravenous  Airway Management Planned: Simple Face Mask  Additional Equipment:   Intra-op Plan:   Post-operative Plan:   Informed Consent:   Dental advisory given  Plan Discussed with: CRNA and Anesthesiologist  Anesthesia Plan Comments:        Anesthesia Quick Evaluation

## 2014-12-19 NOTE — H&P (Signed)
TOTAL HIP ADMISSION H&P  Patient is admitted for left total hip arthroplasty.  Subjective:  Chief Complaint: left hip pain  HPI: Roger Ingram, 81 y.o. male, has a history of pain and functional disability in the left hip(s) due to arthritis and patient has failed non-surgical conservative treatments for greater than 12 weeks to include NSAID's and/or analgesics, use of assistive devices and activity modification.  Onset of symptoms was gradual starting 5 years ago with gradually worsening course since that time.The patient noted no past surgery on the left hip(s).  Patient currently rates pain in the left hip at 10 out of 10 with activity. Patient has night pain, worsening of pain with activity and weight bearing, trendelenberg gait, pain that interfers with activities of daily living, pain with passive range of motion and crepitus. Patient has evidence of subchondral cysts, subchondral sclerosis, periarticular osteophytes and joint space narrowing by imaging studies. This condition presents safety issues increasing the risk of falls.  There is no current active infection.  Patient Active Problem List   Diagnosis Date Noted  . Osteoarthritis of left hip 12/19/2014  . Degenerative joint disease (DJD) of hip 11/13/2014  . Popliteal cyst, unruptured 06/17/2014  . DDD (degenerative disc disease), lumbosacral 10/26/2013  . NOCTURIA 12/28/2009  . Essential hypertension 07/06/2009  . FASTING HYPERGLYCEMIA 10/23/2008  . Hyperlipidemia 10/02/2008  . INTENTION TREMOR 10/02/2008  . Diverticulosis of large intestine 10/02/2008  . NEURITIS 10/02/2008  . MALIGNANT MELANOMA, HX OF 10/02/2008  . SKIN CANCER, HX OF 10/02/2008  . History of colonic polyps 10/02/2008  . ELEVATED PROSTATE SPECIFIC ANTIGEN 09/08/2008  . HYPERPLASIA PROSTATE UNS W/UR OBST & OTH LUTS 06/02/2008   Past Medical History  Diagnosis Date  . Basal cell cancer   . Melanoma   . Hx of colonic polyps   . Elevated PSA     [facility]  . Diverticulosis of colon   . Hyperlipidemia   . BPH (benign prostatic hypertrophy)     Dr Junious Silk  . Hypertension   . History of kidney stones     Past Surgical History  Procedure Laterality Date  . Colonoscopy w/ polypectomy  1997, 2011    Dr Earlean Shawl  . Basal cell      R  & L temple; Dr Allyson Sabal  . Melanoma      L forehead  . Right rotator cuff sx      No prescriptions prior to admission   Allergies  Allergen Reactions  . Hydrocodone     REACTION: diplopia  . Iohexol     Syncope post IV dye for renal calculi 1963    History  Substance Use Topics  . Smoking status: Never Smoker   . Smokeless tobacco: Not on file  . Alcohol Use: No    Family History  Problem Relation Age of Onset  . Stroke Mother 37  . Heart attack Father 68  . Stroke Father   . Colon cancer Brother 69  . Stroke Paternal Grandfather     in 81s  . Diabetes Son     IDDM     Review of Systems  Musculoskeletal: Positive for joint pain.  All other systems reviewed and are negative.   Objective:  Physical Exam  Constitutional: He is oriented to person, place, and time. He appears well-developed and well-nourished.  HENT:  Head: Normocephalic and atraumatic.  Eyes: EOM are normal. Pupils are equal, round, and reactive to light.  Neck: Normal range of motion. Neck  supple.  Cardiovascular: Normal rate and regular rhythm.   Respiratory: Effort normal and breath sounds normal.  GI: Soft. Bowel sounds are normal.  Musculoskeletal:       Left hip: He exhibits decreased range of motion, decreased strength, tenderness, bony tenderness and crepitus.  Neurological: He is alert and oriented to person, place, and time.  Skin: Skin is warm and dry.  Psychiatric: He has a normal mood and affect.    Vital signs in last 24 hours:    Labs:   Estimated body mass index is 22.55 kg/(m^2) as calculated from the following:   Height as of 11/13/14: 5\' 8"  (1.727 m).   Weight as of 11/13/14: 67.246 kg  (148 lb 4 oz).   Imaging Review Plain radiographs demonstrate severe degenerative joint disease of the left hip(s). The bone quality appears to be good for age and reported activity level.  Assessment/Plan:  End stage arthritis, left hip(s)  The patient history, physical examination, clinical judgement of the provider and imaging studies are consistent with end stage degenerative joint disease of the left hip(s) and total hip arthroplasty is deemed medically necessary. The treatment options including medical management, injection therapy, arthroscopy and arthroplasty were discussed at length. The risks and benefits of total hip arthroplasty were presented and reviewed. The risks due to aseptic loosening, infection, stiffness, dislocation/subluxation,  thromboembolic complications and other imponderables were discussed.  The patient acknowledged the explanation, agreed to proceed with the plan and consent was signed. Patient is being admitted for inpatient treatment for surgery, pain control, PT, OT, prophylactic antibiotics, VTE prophylaxis, progressive ambulation and ADL's and discharge planning.The patient is planning to be discharged to skilled nursing facility

## 2014-12-19 NOTE — Anesthesia Postprocedure Evaluation (Signed)
  Anesthesia Post-op Note  Patient: Roger Ingram  Procedure(s) Performed: Procedure(s): LEFT TOTAL HIP ARTHROPLASTY ANTERIOR APPROACH (Left)  Patient Location: PACU  Anesthesia Type:Spinal  Level of Consciousness: awake  Airway and Oxygen Therapy: Patient Spontanous Breathing  Post-op Pain: mild  Post-op Assessment: Post-op Vital signs reviewed  Post-op Vital Signs: Reviewed  Last Vitals:  Filed Vitals:   12/19/14 1715  BP: 115/47  Pulse: 57  Temp:   Resp: 17    Complications: No apparent anesthesia complications

## 2014-12-19 NOTE — Anesthesia Procedure Notes (Signed)
Spinal  End time: 12/19/2014 3:14 PM Staffing Resident/CRNA: Enrigue Catena E Preanesthetic Checklist Completed: patient identified, site marked, surgical consent, pre-op evaluation, timeout performed, IV checked, risks and benefits discussed and monitors and equipment checked Spinal Block Patient position: sitting Prep: Betadine Patient monitoring: heart rate, continuous pulse ox and blood pressure Approach: right paramedian Location: L3-4 Injection technique: single-shot Needle Needle type: Spinocan  Needle gauge: 22 G Additional Notes Pt tolerated procedure well. CSFx 3, negative heme or paresthesia. Spinal kit and drugs within date.

## 2014-12-20 ENCOUNTER — Inpatient Hospital Stay (HOSPITAL_COMMUNITY): Payer: Medicare Other

## 2014-12-20 LAB — BASIC METABOLIC PANEL
Anion gap: 4 — ABNORMAL LOW (ref 5–15)
BUN: 22 mg/dL — ABNORMAL HIGH (ref 6–20)
CO2: 25 mmol/L (ref 22–32)
Calcium: 8.3 mg/dL — ABNORMAL LOW (ref 8.9–10.3)
Chloride: 102 mmol/L (ref 101–111)
Creatinine, Ser: 0.96 mg/dL (ref 0.61–1.24)
GFR calc Af Amer: >60 mL/min (ref >60)
GLUCOSE: 185 mg/dL — AB (ref 70–99)
POTASSIUM: 4.4 mmol/L (ref 3.5–5.1)
SODIUM: 131 mmol/L — AB (ref 135–145)

## 2014-12-20 LAB — CBC
HCT: 35.5 % — ABNORMAL LOW (ref 39.0–52.0)
Hemoglobin: 11.9 g/dL — ABNORMAL LOW (ref 13.0–17.0)
MCH: 29.6 pg (ref 26.0–34.0)
MCHC: 33.5 g/dL (ref 30.0–36.0)
MCV: 88.3 fL (ref 78.0–100.0)
Platelets: 203 10*3/uL (ref 150–400)
RBC: 4.02 MIL/uL — ABNORMAL LOW (ref 4.22–5.81)
RDW: 12.3 % (ref 11.5–15.5)
WBC: 12.4 10*3/uL — ABNORMAL HIGH (ref 4.0–10.5)

## 2014-12-20 MED ORDER — MINERAL OIL RE ENEM
1.0000 | ENEMA | Freq: Once | RECTAL | Status: AC
  Administered 2014-12-20: 1 via RECTAL
  Filled 2014-12-20: qty 1

## 2014-12-20 MED ORDER — MAGNESIUM CITRATE PO SOLN
1.0000 | Freq: Every day | ORAL | Status: DC | PRN
  Administered 2014-12-20: 1 via ORAL

## 2014-12-20 MED ORDER — OXYCODONE-ACETAMINOPHEN 5-325 MG PO TABS: 1.0000 | ORAL_TABLET | ORAL | Status: DC | PRN

## 2014-12-20 MED ORDER — FLEET ENEMA 7-19 GM/118ML RE ENEM: 1.0000 | ENEMA | Freq: Every day | RECTAL | Status: DC | PRN

## 2014-12-20 MED ORDER — ASPIRIN 325 MG PO TBEC: 325.0000 mg | DELAYED_RELEASE_TABLET | Freq: Two times a day (BID) | ORAL | Status: DC

## 2014-12-20 MED ORDER — LORAZEPAM 1 MG PO TABS: 1.0000 mg | ORAL_TABLET | Freq: Four times a day (QID) | ORAL | Status: DC | PRN

## 2014-12-20 NOTE — Discharge Instructions (Signed)

## 2014-12-20 NOTE — Evaluation (Signed)
Physical Therapy Evaluation Patient Details Name: Roger Ingram MRN: [id number] DOB: 1928/12/28 Today's Date: 12/20/2014   History of Present Illness  L THR  Clinical Impression  Pt s/p L THR presents with decreased L LE strength/ROM and post op pain limiting functional mobility.  Pt should progress to dc home with family assist and HHPT follow up.    Follow Up Recommendations Home health PT    Equipment Recommendations  None recommended by PT    Recommendations for Other Services OT consult     Precautions / Restrictions Precautions Precautions: Fall Restrictions Weight Bearing Restrictions: No Other Position/Activity Restrictions: WBAT      Mobility  Bed Mobility Overal bed mobility: Needs Assistance Bed Mobility: Supine to Sit     Supine to sit: Min assist     General bed mobility comments: cues for sequence and use of UEs to self assist  Transfers Overall transfer level: Needs assistance Equipment used: Rolling walker (2 wheeled) Transfers: Sit to/from Stand Sit to Stand: Min assist         General transfer comment: cues for LE management and use of UEs to self assist  Ambulation/Gait Ambulation/Gait assistance: Min assist Ambulation Distance (Feet): 75 Feet Assistive device: Rolling walker (2 wheeled) Gait Pattern/deviations: Step-to pattern;Step-through pattern;Decreased step length - right;Decreased step length - left;Shuffle;Trunk flexed Gait velocity: decr   General Gait Details: cues for posture, position and initial sequence  Stairs            Wheelchair Mobility    Modified Rankin (Stroke Patients Only)       Balance                                             Pertinent Vitals/Pain Pain Assessment: 0-10 Pain Score: 4  Pain Location: L hip Pain Descriptors / Indicators: Aching;Sore Pain Intervention(s): Limited activity within patient's tolerance;Monitored during session;Premedicated before session;Ice  applied    Home Living Family/patient expects to be discharged to:: Private residence Living Arrangements: Spouse/significant other Available Help at Discharge: Family Type of Home: House Home Access: Level entry     Home Layout: One level Home Equipment: Environmental consultant - 4 wheels      Prior Function Level of Independence: Independent               Hand Dominance        Extremity/Trunk Assessment   Upper Extremity Assessment: Overall WFL for tasks assessed           Lower Extremity Assessment: LLE deficits/detail   LLE Deficits / Details: 2+/5 hip strength.  AAROM at hip to 90 flex and 20 abd  Cervical / Trunk Assessment: Normal  Communication   Communication: No difficulties  Cognition Arousal/Alertness: Awake/alert Behavior During Therapy: WFL for tasks assessed/performed Overall Cognitive Status: Within Functional Limits for tasks assessed                      General Comments      Exercises Total Joint Exercises Ankle Circles/Pumps: AROM;Both;15 reps;Supine Quad Sets: AROM;Both;10 reps;Supine Heel Slides: AAROM;Left;15 reps;Supine Hip ABduction/ADduction: AAROM;Left;10 reps;Supine      Assessment/Plan    PT Assessment Patient needs continued PT services  PT Diagnosis Difficulty walking   PT Problem List Decreased strength;Decreased range of motion;Decreased activity tolerance;Decreased mobility;Decreased knowledge of use of DME;Pain  PT Treatment Interventions DME instruction;Gait training;Stair training;Therapeutic  exercise;Therapeutic activities;Functional mobility training;Patient/family education   PT Goals (Current goals can be found in the Care Plan section) Acute Rehab PT Goals Patient Stated Goal: Get back to walking a lot like I used to PT Goal Formulation: With patient Time For Goal Achievement: 12/27/14 Potential to Achieve Goals: Good    Frequency 7X/week   Barriers to discharge        Co-evaluation                End of Session Equipment Utilized During Treatment: Gait belt Activity Tolerance: Patient tolerated treatment well Patient left: in chair;with call bell/phone within reach;with family/visitor present Nurse Communication: Mobility status         Time: 8309-4076 PT Time Calculation (min) (ACUTE ONLY): 38 min   Charges:   PT Evaluation $Initial PT Evaluation Tier I: 1 Procedure PT Treatments $Gait Training: 8-22 mins $Therapeutic Exercise: 8-22 mins   PT G Codes:        Sarah-Jane Nazario Jan 03, 2015, 1:17 PM

## 2014-12-20 NOTE — Op Note (Signed)
NAMEZEBASTIAN, CARICO NO.:  [id number]  MEDICAL RECORD NO.:  [id number]  LOCATION:  3825                         FACILITY:  [facility]  PHYSICIAN:  Lind Guest. Ninfa Linden, M.D.DATE OF BIRTH:  [birth year]  DATE OF PROCEDURE:  12/19/2014 DATE OF DISCHARGE:                              OPERATIVE REPORT   PREOPERATIVE DIAGNOSES:  Primary osteoarthritis and degenerative joint disease of left hip.  POSTOPERATIVE DIAGNOSES:  Primary osteoarthritis and degenerative joint disease of left hip.  PROCEDURE:  Left total hip arthroplasty through direct anterior approach.  IMPLANTS:  DePuy Sector Gription acetabular component size 52 with an apex hole eliminator and a single screw, size 36+ 0 neutral polyethylene liner, size 11 Corail femoral component with standard offset, size 36+ 1.5 Ceramic hip ball.  SURGEON:  Lind Guest. Ninfa Linden, M.D.  ASSISTANT:  Erskine Emery, PA-C.  ANESTHESIA:  Spinal.  BLOOD LOSS:  150 mL.  ANTIBIOTICS:  2 g of IV Ancef.  COMPLICATIONS:  None.  INDICATIONS:  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  He has x- rays that showed complete loss of the superior lateral joint space.  He has daily pain, decreased mobility and decreased quality of life.  At this point, he has had injections and anti-inflammatories and has failed all forms of conservative treatment.  He wished to proceed with total hip arthroplasty through direct anterior approach.  He understands the risks of acute blood loss anemia, nerve and vessel injury, fracture, infection, dislocation and DVT.  He understands the goals are decreased pain, improved mobility, and overall improved quality of life.  PROCEDURE DESCRIPTION:  After informed consent was obtained, appropriate left hip was marked.  He was brought to the operating room and while he was on his stretcher, spinal anesthesia was applied.  A Foley  catheter was placed, he was laid in the supine position.  Traction boots were placed on both his feet.  Next, he was placed supine on the Hana fracture table with the perineal post in place and both legs in an inline skeletal traction devices, but no traction applied.  His left operative hip was then prepped and draped with DuraPrep and sterile drapes.  A time-out was called, he was identified as correct patient and correct left hip.  We then made an incision inferior and posterior to the anterior-superior iliac spine and carried this obliquely down the leg.  We dissected down the tensor fascia lata muscle.  Tensor fascia was divided longitudinally so we could threat through direct anterior approach to the hip.  We identified and cauterized the lateral femoral circumflex vessels and then was able to place a Cobra retractor around the lateral femoral neck and up underneath the rectus femoris, a Cobra retractor medially.  I then opened up the hip capsule in a L-type format finding large joint effusion and placed the retractors within the hip capsule.  We then made our femoral neck cut with an oscillating saw proximal to the lesser trochanter and completed this with an osteotome, placed a corkscrew guide in the femoral head and removed the femoral head in his entirety and  found to be completely devoid of cartilage.  We then removed the femoral head and removed the debride and soft tissue from within the acetabulum including the remnants of the acetabular labrum and periarticular osteophytes.  I placed a bent Hohmann around the medial acetabular rim and then we began reaming under direct visualization from a size 42 reamer in 2 mm increments all the way up to a size 52 with again all reamers under direct visualization and the last reamer also under direct fluoroscopy, so we obtain our death of reamer, our inclination and anteversion.  Once I was pleased with this, I placed the real DePuy  Sector Gription acetabular component, size 52, and apex hole eliminator, and a single screw.  Attention was then turned to the femur.  With the leg externally rotated to about 100 degrees, extended and adducted, I was able placed a Mueller retractor medially and a Hohmann retractor behind the greater trochanter.  I released the lateral capsule and used a box cutting osteotome to enter femoral canal and a rongeur to lateralize.  I then began broaching using Corail broaching system from DePuy from size 8 up to a size 11.  With the size 11, we used a calcar planer and then trialed a standard neck and a 36+ 1.5 trial hip ball.  We brought the leg back over and up with traction and internal rotation reducing the pelvis.  We were pleased throughout his arc of motion in terms of his stability, his minimal shuck, and his offset and leg lengths were measured near equal under direct fluoroscopy.  We then dislocated the hip and removed the trial components.  I placed the real Corail femoral component size 11 with standard offset and the real 36+ 1.5 ceramic hip ball.  We reduced this again in the acetabulum again, and I was pleased with stability.  We then copiously irrigated the soft tissue with normal saline solution using pulsatile lavage.  We closed the joint capsule with interrupted #1 Ethibond suture followed by running #1 Vicryl in the tensor fascia, 0 Vicryl in the deep tissue, 2-0 Vicryl in subcutaneous tissue, 4-0 Monocryl in the subcuticular stitch and Steri-Strips on the skin.  An Aquacel dressing was applied.  He was then taken off the Hana table and taken to the recovery room in stable condition.  All final counts were correct and there were no complications noted.  Of note, Erskine Emery, PA-C, assisted during the entire case and his assistance was crucial for facilitating all aspects of this case including the layered closure of the wound.     Lind Guest. Ninfa Linden,  M.D.     CYB/MEDQ  D:  12/19/2014  T:  12/20/2014  Job:  [id number]

## 2014-12-20 NOTE — Care Management Note (Addendum)
Case Management Note  Patient Details  Name: CAP MASSI MRN: [id number] Date of Birth: 05-Aug-1929  Subjective/Objective:                    Action/Plan:   Expected Discharge Date:                  Expected Discharge Plan:  [place]  In-House Referral:     Discharge planning Services  CM Consult  Post Acute Care Choice:  Home Health Choice offered to:  Adult Children  DME Arranged:    DME Agency:     HH Arranged:  PT HH Agency:  [place]  Status of Service:  Completed, signed off  Medicare Important Message Given:    Date Medicare IM Given:    Medicare IM give by:    Date Additional Medicare IM Given:    Additional Medicare Important Message give by:     If discussed at [place] of Stay Meetings, dates discussed:    Additional Comments:  NCM spoke to pt and gave permission to speak to dtr, Rennis Petty, [place]. Dtr requesting 3n1 for home. Pt has RW at home. Offered choice for [facility] and dtr agreeable to [facility] for [facility]. Contacted [facility] for 3n1 for delivery in am. Jonnie Finner RN CCM Case Mgmt phone [phone] Erenest Rasher, RN 12/20/2014, 5:51 PM

## 2014-12-20 NOTE — Progress Notes (Signed)
Physical Therapy Treatment Patient Details Name: Roger Ingram MRN: [id number] DOB: 1929-01-16 Today's Date: 12-28-2014    History of Present Illness L THR    PT Comments    Pt motivated and progressing well despite ongoing nausea  Follow Up Recommendations  Home health PT     Equipment Recommendations  None recommended by PT    Recommendations for Other Services OT consult     Precautions / Restrictions Precautions Precautions: Fall Restrictions Weight Bearing Restrictions: No Other Position/Activity Restrictions: WBAT    Mobility  Bed Mobility Overal bed mobility: Needs Assistance Bed Mobility: Sit to Supine       Sit to supine: Min assist   General bed mobility comments: cues for sequence and use of UEs to self assist  Transfers Overall transfer level: Needs assistance Equipment used: Rolling walker (2 wheeled) Transfers: Sit to/from Stand Sit to Stand: Min assist         General transfer comment: cues for LE management and use of UEs to self assist  Ambulation/Gait Ambulation/Gait assistance: Min assist Ambulation Distance (Feet): 150 Feet Assistive device: Rolling walker (2 wheeled) Gait Pattern/deviations: Step-to pattern;Step-through pattern;Decreased step length - right;Decreased step length - left;Shuffle;Trunk flexed Gait velocity: decr   General Gait Details: cues for posture, position and initial sequence   Stairs            Wheelchair Mobility    Modified Rankin (Stroke Patients Only)       Balance                                    Cognition Arousal/Alertness: Awake/alert Behavior During Therapy: WFL for tasks assessed/performed Overall Cognitive Status: Within Functional Limits for tasks assessed                      Exercises      General Comments        Pertinent Vitals/Pain Pain Assessment: 0-10 Pain Score: 4  Pain Location: L hip Pain Descriptors / Indicators: Aching Pain  Intervention(s): Monitored during session;Limited activity within patient's tolerance;Ice applied    Home Living                      Prior Function            PT Goals (current goals can now be found in the care plan section) Acute Rehab PT Goals Patient Stated Goal: Get back to walking a lot like I used to PT Goal Formulation: With patient Time For Goal Achievement: 12/27/14 Potential to Achieve Goals: Good Progress towards PT goals: Progressing toward goals    Frequency  7X/week    PT Plan Current plan remains appropriate    Co-evaluation             End of Session Equipment Utilized During Treatment: Gait belt Activity Tolerance: Patient tolerated treatment well Patient left: in bed;with call bell/phone within reach;with family/visitor present     Time: 1751-0258 PT Time Calculation (min) (ACUTE ONLY): 16 min  Charges:  $Gait Training: 8-22 mins                    G Codes:      Roger Ingram 28-Dec-2014, 5:21 PM

## 2014-12-20 NOTE — Progress Notes (Signed)
   Subjective:  Patient reports pain as mild.  C/o indigestion and nauseous.  Objective:   VITALS:   Filed Vitals:   12/19/14 2131 12/19/14 2200 12/20/14 0110 12/20/14 0510  BP: 126/59  173/88 140/70  Pulse: 59 67 65 60  Temp: 97.6 F (36.4 C)  97.5 F (36.4 C) 97.6 F (36.4 C)  TempSrc: Oral  Oral Oral  Resp: 16  16 16   Height:      Weight:      SpO2: 96%  98% 99%    Neurologically intact ABD soft Neurovascular intact Sensation intact distally Intact pulses distally Dorsiflexion/Plantar flexion intact Incision: dressing C/D/I and no drainage No cellulitis present Compartment soft   Lab Results  Component Value Date   WBC 12.4* 12/20/2014   HGB 11.9* 12/20/2014   HCT 35.5* 12/20/2014   MCV 88.3 12/20/2014   PLT 203 12/20/2014     Assessment/Plan:  1 Day Post-Op   - Expected postop acute blood loss anemia - will monitor for symptoms - Up with PT/OT - DVT ppx - SCDs, ambulation, asa - WBAT left lower extremity - Pain control - Discharge planning - patient has diverticulosis, will follow  Marianna Payment 12/20/2014, 8:08 AM [phone]

## 2014-12-20 NOTE — Progress Notes (Addendum)
Pt continued to c/o N/V throughout day with No BM. Pt overly concerned as he relates this to past "shoulder surgery" when he became constipated. Stated Hx of Diverticulosis. Will call MD. Much ado with wife and daughter as their perception of how this important issue was being handled during day-shift less than acceptable by nursing staff. Will call MD on call.

## 2014-12-21 LAB — CBC
HEMATOCRIT: 32.6 % — AB (ref 39.0–52.0)
HEMOGLOBIN: 10.9 g/dL — AB (ref 13.0–17.0)
MCH: 29.5 pg (ref 26.0–34.0)
MCHC: 33.4 g/dL (ref 30.0–36.0)
MCV: 88.3 fL (ref 78.0–100.0)
Platelets: 198 10*3/uL (ref 150–400)
RBC: 3.69 MIL/uL — ABNORMAL LOW (ref 4.22–5.81)
RDW: 12.7 % (ref 11.5–15.5)
WBC: 13.6 10*3/uL — ABNORMAL HIGH (ref 4.0–10.5)

## 2014-12-21 NOTE — Progress Notes (Signed)
Physical Therapy Treatment Patient Details Name: Roger Ingram MRN: [id number] DOB: May 15, 1929 Today's Date: 12/21/2014    History of Present Illness L THR    PT Comments    Progressing well with mobility and hopeful for dc home this date dependent on nausea.  Follow Up Recommendations  Home health PT     Equipment Recommendations  None recommended by PT    Recommendations for Other Services OT consult     Precautions / Restrictions Precautions Precautions: Fall Restrictions Weight Bearing Restrictions: No Other Position/Activity Restrictions: WBAT    Mobility  Bed Mobility Overal bed mobility: Needs Assistance Bed Mobility: Sit to Supine     Supine to sit: Min assist     General bed mobility comments: cues for sequence and use of R LE to self assist  Transfers Overall transfer level: Needs assistance Equipment used: Rolling walker (2 wheeled) Transfers: Sit to/from Stand Sit to Stand: Min assist         General transfer comment: cues for LE management and use of UEs to self assist  Ambulation/Gait Ambulation/Gait assistance: Min guard;Supervision Ambulation Distance (Feet): 300 Feet (and 150 with 4Wh RW) Assistive device: Rolling walker (2 wheeled);4-wheeled walker Gait Pattern/deviations: Step-to pattern;Step-through pattern;Decreased step length - right;Decreased step length - left;Shuffle;Trunk flexed Gait velocity: decr   General Gait Details: cues for posture, position and initial sequence   Stairs            Wheelchair Mobility    Modified Rankin (Stroke Patients Only)       Balance                                    Cognition Arousal/Alertness: Awake/alert Behavior During Therapy: WFL for tasks assessed/performed Overall Cognitive Status: Within Functional Limits for tasks assessed                      Exercises Total Joint Exercises Ankle Circles/Pumps: AROM;Both;15 reps;Supine Quad Sets:  AROM;Both;10 reps;Supine Gluteal Sets: AROM;Both;10 reps;Supine Heel Slides: AAROM;Left;Supine;20 reps Hip ABduction/ADduction: AAROM;Left;Supine;15 reps Long Arc Quad: AAROM;AROM;Left;10 reps;Seated    General Comments        Pertinent Vitals/Pain Pain Assessment: 0-10 Pain Score: 3  Pain Location: L hip Pain Descriptors / Indicators: Aching Pain Intervention(s): Premedicated before session    Home Living Family/patient expects to be discharged to:: Private residence Living Arrangements: Spouse/significant other Available Help at Discharge: Family Type of Home: House Home Access: Level entry   Home Layout: One level Home Equipment: Environmental consultant - 4 wheels      Prior Function Level of Independence: Independent          PT Goals (current goals can now be found in the care plan section) Acute Rehab PT Goals Patient Stated Goal: go home PT Goal Formulation: With patient Time For Goal Achievement: 12/27/14 Potential to Achieve Goals: Good Progress towards PT goals: Progressing toward goals    Frequency  7X/week    PT Plan Current plan remains appropriate    Co-evaluation             End of Session Equipment Utilized During Treatment: Gait belt Activity Tolerance: Patient tolerated treatment well Patient left: in chair;with call bell/phone within reach     Time: 0817-0858 PT Time Calculation (min) (ACUTE ONLY): 41 min  Charges:  $Gait Training: 8-22 mins $Therapeutic Exercise: 8-22 mins $Therapeutic Activity: 8-22 mins  G Codes:      Saylor Murry 2014-12-31, 12:18 PM

## 2014-12-21 NOTE — Progress Notes (Signed)
   Subjective:  C/o indigestion and nauseous. Did pass gas this morning for the first time.  Objective:   VITALS:   Filed Vitals:   12/20/14 1057 12/20/14 1347 12/20/14 2034 12/21/14 0507  BP: 123/55 127/49 120/53 137/64  Pulse: 52 60 66 81  Temp: 97.8 F (36.6 C) 97.9 F (36.6 C) 98.3 F (36.8 C) 98.4 F (36.9 C)  TempSrc: Oral Oral Oral Oral  Resp: 16 16 17 16   Height:      Weight:      SpO2: 96% 99% 93% 92%    Dressing c/d/i abd soft, ND, NT, no guarding, active bowel sounds   Lab Results  Component Value Date   WBC 13.6* 12/21/2014   HGB 10.9* 12/21/2014   HCT 32.6* 12/21/2014   MCV 88.3 12/21/2014   PLT 198 12/21/2014     Assessment/Plan:  2 Days Post-Op   - up with PT - suppository, enema prn - can dc home today after PT if nausea is improved  Marianna Payment 12/21/2014, 7:02 AM [phone]

## 2014-12-21 NOTE — Progress Notes (Signed)
Physical Therapy Treatment Patient Details Name: Roger Ingram MRN: [id number] DOB: [birth year] Today's Date: 12/21/2014    History of Present Illness L THR    PT Comments    Pt's dtr present and reviewed stairs, car transfers and bed mobility.  Follow Up Recommendations  Home health PT     Equipment Recommendations  None recommended by PT    Recommendations for Other Services OT consult     Precautions / Restrictions Precautions Precautions: Fall Restrictions Weight Bearing Restrictions: No Other Position/Activity Restrictions: WBAT    Mobility  Bed Mobility Overal bed mobility: Needs Assistance Bed Mobility: Sit to Supine     Supine to sit: Min assist Sit to supine: Min guard   General bed mobility comments: cues for sequence and use of R LE to self assist  Transfers Overall transfer level: Needs assistance Equipment used: Rolling walker (2 wheeled) Transfers: Sit to/from Stand Sit to Stand: Min guard;Supervision         General transfer comment: cues for LE management and use of UEs to self assist  Ambulation/Gait Ambulation/Gait assistance: Min guard;Supervision Ambulation Distance (Feet): 100 Feet Assistive device: Rolling walker (2 wheeled) Gait Pattern/deviations: Step-to pattern;Step-through pattern;Decreased step length - right;Decreased step length - left;Shuffle;Trunk flexed Gait velocity: decr   General Gait Details: min cues for posture, position from RW   Stairs Stairs: Yes Stairs assistance: Min assist Stair Management: No rails;Step to pattern;Forwards;With walker Number of Stairs: 2 General stair comments: single step twice with RW, min assist and cues for sequence  Wheelchair Mobility    Modified Rankin (Stroke Patients Only)       Balance                                    Cognition Arousal/Alertness: Awake/alert Behavior During Therapy: WFL for tasks assessed/performed Overall Cognitive Status: Within  Functional Limits for tasks assessed                      Exercises Total Joint Exercises Ankle Circles/Pumps: AROM;Both;15 reps;Supine Quad Sets: AROM;Both;10 reps;Supine Gluteal Sets: AROM;Both;10 reps;Supine Heel Slides: AAROM;Left;Supine;20 reps Hip ABduction/ADduction: AAROM;Left;Supine;15 reps Long Arc Quad: AAROM;AROM;Left;10 reps;Seated    General Comments        Pertinent Vitals/Pain Pain Assessment: 0-10 Pain Score: 3  Pain Location: L hip Pain Descriptors / Indicators: Aching Pain Intervention(s): Limited activity within patient's tolerance;Monitored during session;Premedicated before session;Ice applied    Home Living Family/patient expects to be discharged to:: Private residence Living Arrangements: Spouse/significant other Available Help at Discharge: Family Type of Home: House Home Access: Level entry   Home Layout: One level Home Equipment: Environmental consultant - 4 wheels      Prior Function Level of Independence: Independent          PT Goals (current goals can now be found in the care plan section) Acute Rehab PT Goals Patient Stated Goal: Dtr has arrived with questions PT Goal Formulation: With patient Time For Goal Achievement: 12/27/14 Potential to Achieve Goals: Good Progress towards PT goals: Progressing toward goals    Frequency  7X/week    PT Plan Current plan remains appropriate    Co-evaluation             End of Session Equipment Utilized During Treatment: Gait belt Activity Tolerance: Patient tolerated treatment well Patient left: in bed;with call bell/phone within reach;with family/visitor present     Time: 9562-1308 PT  Time Calculation (min) (ACUTE ONLY): 25 min  Charges:  $Gait Training: 8-22 mins $Therapeutic Exercise: 8-22 mins $Therapeutic Activity: 8-22 mins                    G Codes:      Jonta Gastineau 12-26-2014, 12:24 PM

## 2014-12-21 NOTE — Progress Notes (Signed)
Pt alert and oriented x 4. bp in the low at 134/46, hr 69. Three bp meds scheduled for AM held and MD made aware. Dr. Erlinda Hong said to keep holding bp meds as long as  bp remained wnl. bp rechecked at 1230, 152/53, hr 84.  Pt denies any symptom of nausea at this time; though reports a pain level  of 3/10 with activities and refuses pain med. Resting comfortably in room at this time. No concerns voiced.

## 2014-12-21 NOTE — Progress Notes (Signed)
Pt called this RN to room and requested discharge papers stating he wanted to go home. MD made aware. Michela Pitcher its ok to send pt home. Vwilliams,rn.

## 2014-12-21 NOTE — Progress Notes (Signed)
Pt discharged to home. Dc instructions given with wife and daughter at bedside. Prescriptions for pain med and aspirin also given. This RN review all patients medication including one he had taken today and all that he needs to take later. All patient's and family's questions answered to their satisfaction. Pt wanted to go home though wife and daughter wanted him to have a BM before discharge. Pt insisted that he went home since he was feeling better with no nausea and minimum to no pain. Pt encouraged to call MD's office with any concerns or problems. Voiced understanding. Left unit in wheelchair pushed by nurse tech. Left in good condition accompanied by dtr. Vwilliams,rn.

## 2014-12-21 NOTE — Evaluation (Signed)
Occupational Therapy Evaluation Patient Details Name: Roger Ingram MRN: [id number] DOB: [birth year] Today's Date: 12/21/2014    History of Present Illness L THR   Clinical Impression   Pt. And daughter were educated on use of AE for LE dressing. Pt. Does not want secondary wife can A. Pt. Was able to perform transfer to commode with Min A. Pt. Was Min A with shower transfers. Pt. Does have adequate level of A currently to d/c home. Pt. Does not need further acute OT, but would benefit from [facility] for higher level ADLs.     Follow Up Recommendations  Home health OT    Equipment Recommendations  3 in 1 bedside comode    Recommendations for Other Services       Precautions / Restrictions Precautions Precautions: Fall Restrictions Other Position/Activity Restrictions: WBAT      Mobility Bed Mobility                  Transfers       Sit to Stand: Min assist         General transfer comment: cues for LE management and use of UEs to self assist    Balance                                            ADL Overall ADL's : Needs assistance/impaired Eating/Feeding: Independent   Grooming: Wash/dry hands;Wash/dry face;Set up   Upper Body Bathing: Set up   Lower Body Bathing: Moderate assistance   Upper Body Dressing : Set up   Lower Body Dressing: Moderate assistance   Toilet Transfer: Minimal assistance   Toileting- Clothing Manipulation and Hygiene: Minimal assistance   Tub/ Shower Transfer: Minimal assistance   Functional mobility during ADLs: Min guard General ADL Comments:  (Pt. does not want ed. on use of AE for LE dressing.)     Vision     Perception     Praxis      Pertinent Vitals/Pain Pain Assessment: 0-10 Pain Score: 3  Pain Location: L hip Pain Descriptors / Indicators: Aching Pain Intervention(s): Premedicated before session     Hand Dominance     Extremity/Trunk Assessment Upper Extremity Assessment Upper  Extremity Assessment: Overall WFL for tasks assessed           Communication Communication Communication: No difficulties   Cognition Arousal/Alertness: Awake/alert Behavior During Therapy: WFL for tasks assessed/performed Overall Cognitive Status: Within Functional Limits for tasks assessed                     General Comments       Exercises       Shoulder Instructions      Home Living Family/patient expects to be discharged to:: Private residence Living Arrangements: Spouse/significant other Available Help at Discharge: Family Type of Home: House Home Access: Level entry     Home Layout: One level     Bathroom Shower/Tub: Walk-in shower;Door   [employer] Toilet: Standard Bathroom Accessibility: Yes How Accessible: Accessible via walker Home Equipment: Walker - 4 wheels          Prior Functioning/Environment Level of Independence: Independent             OT Diagnosis: Generalized weakness   OT Problem List:     OT Treatment/Interventions:      OT Goals(Current goals can be found in the  care plan section) Acute Rehab OT Goals Patient Stated Goal: go home  OT Frequency:     Barriers to D/C:            Co-evaluation              End of Session Equipment Utilized During Treatment: Rolling walker Nurse Communication: Patient requests pain meds  Activity Tolerance: Patient tolerated treatment well Patient left: in chair;with call bell/phone within reach;with family/visitor present   Time: 7121-9758 OT Time Calculation (min): 46 min Charges:  OT General Charges $OT Visit: 1 Procedure OT Evaluation $Initial OT Evaluation Tier I: 1 Procedure OT Treatments $Self Care/Home Management : 23-37 mins G-Codes:    Roger Ingram 2014/12/23, 9:48 AM

## 2014-12-22 ENCOUNTER — Encounter (HOSPITAL_COMMUNITY): Payer: Self-pay | Admitting: Orthopaedic Surgery

## 2014-12-22 ENCOUNTER — Telehealth: Payer: Self-pay | Admitting: *Deleted

## 2014-12-22 DIAGNOSIS — Z96642 Presence of left artificial hip joint: Secondary | ICD-10-CM | POA: Diagnosis not present

## 2014-12-22 DIAGNOSIS — I10 Essential (primary) hypertension: Secondary | ICD-10-CM | POA: Diagnosis not present

## 2014-12-22 DIAGNOSIS — Z471 Aftercare following joint replacement surgery: Secondary | ICD-10-CM | POA: Diagnosis not present

## 2014-12-22 DIAGNOSIS — Z9181 History of falling: Secondary | ICD-10-CM | POA: Diagnosis not present

## 2014-12-22 DIAGNOSIS — M5137 Other intervertebral disc degeneration, lumbosacral region: Secondary | ICD-10-CM | POA: Diagnosis not present

## 2014-12-22 DIAGNOSIS — G252 Other specified forms of tremor: Secondary | ICD-10-CM | POA: Diagnosis not present

## 2014-12-22 NOTE — Discharge Summary (Signed)
Physician Discharge Summary      Patient ID: Roger Ingram MRN: [id number] DOB/AGE: Apr 23, 1929 81 y.o.  Admit date: 12/19/2014 Discharge date: 12/22/2014  Admission Diagnoses:  Osteoarthritis of left hip  Discharge Diagnoses:  Principal Problem:   Osteoarthritis of left hip Active Problems:   Status post total replacement of left hip   Past Medical History  Diagnosis Date  . Basal cell cancer   . Melanoma   . Hx of colonic polyps   . Elevated PSA     [facility]  . Diverticulosis of colon   . Hyperlipidemia   . BPH (benign prostatic hypertrophy)     Dr Junious Silk  . Hypertension   . History of kidney stones   . Chronic kidney disease     Hx of clear fluid sacs on both kidneys. Drained about 2006    Surgeries: Procedure(s): LEFT TOTAL HIP ARTHROPLASTY ANTERIOR APPROACH on 12/19/2014   Consultants (if any):    Discharged Condition: Improved  Hospital Course: Roger Ingram is an 81 y.o. male who was admitted 12/19/2014 with a diagnosis of Osteoarthritis of left hip and went to the operating room on 12/19/2014 and underwent the above named procedures.    He was given perioperative antibiotics:  Anti-infectives    Start     Dose/Rate Route Frequency Ordered Stop   12/19/14 2200  ceFAZolin (ANCEF) IVPB 1 g/50 mL premix     1 g 100 mL/hr over 30 Minutes Intravenous Every 6 hours 12/19/14 1828 12/20/14 0533   12/19/14 1200  ceFAZolin (ANCEF) IVPB 2 g/50 mL premix     2 g 100 mL/hr over 30 Minutes Intravenous On call to O.R. 12/19/14 1144 12/19/14 1505    .  He was given sequential compression devices, early ambulation, and aspirin for DVT prophylaxis.  He benefited maximally from the hospital stay and there were no complications.    Recent vital signs:  Filed Vitals:   12/21/14 1425  BP: 166/60  Pulse: 84  Temp: 99.8 F (37.7 C)  Resp: 84    Recent laboratory studies:  Lab Results  Component Value Date   HGB 10.9* 12/21/2014   HGB 11.9* 12/20/2014   HGB  13.2 12/09/2014   Lab Results  Component Value Date   WBC 13.6* 12/21/2014   PLT 198 12/21/2014   Lab Results  Component Value Date   INR 1.00 12/09/2014   Lab Results  Component Value Date   NA 131* 12/20/2014   K 4.4 12/20/2014   CL 102 12/20/2014   CO2 25 12/20/2014   BUN 22* 12/20/2014   CREATININE 0.96 12/20/2014   GLUCOSE 185* 12/20/2014    Discharge Medications:     Medication List    TAKE these medications        amLODipine-benazepril 10-20 MG per capsule  Commonly known as:  LOTREL  TAKE ONE CAPSULE BY MOUTH ONCE DAILY     aspirin 325 MG EC tablet  Take 1 tablet (325 mg total) by mouth 2 (two) times daily after a meal.     finasteride 5 MG tablet  Commonly known as:  PROSCAR  TAKE ONE TABLET BY MOUTH ONCE DAILY     folic acid 035 MCG tablet  Commonly known as:  FOLVITE  Take 400 mcg by mouth daily.     ibuprofen 200 MG tablet  Commonly known as:  ADVIL,MOTRIN  Take 200 mg by mouth every 6 (six) hours as needed for mild pain.     oxyCODONE-acetaminophen  5-325 MG per tablet  Commonly known as:  ROXICET  Take 1-2 tablets by mouth every 4 (four) hours as needed.     propranolol 10 MG tablet  Commonly known as:  INDERAL  TAKE ONE TABLET BY MOUTH TWICE DAILY FOR  ESSENTIAL  TREMOR     tamsulosin 0.4 MG Caps capsule  Commonly known as:  FLOMAX  TAKE ONE CAPSULE BY MOUTH ONCE DAILY        Diagnostic Studies: Dg Abd 1 View  12/20/2014   CLINICAL DATA:  Nausea and vomiting.  EXAM: ABDOMEN - 1 VIEW  COMPARISON:  02/24/2012  FINDINGS: Gas and stool are present in nondilated colon. There is a paucity of small bowel which limits evaluation for obstruction. No gross intraperitoneal free air, although evaluation is limited on supine radiographs. Left total hip arthroplasty is noted. Visualized lung bases are clear. Scattered calcifications in the left upper abdomen are likely vascular.  IMPRESSION: No definite evidence of bowel obstruction, although evaluation  is partially limited by paucity of small bowel gas.   Electronically Signed   By: Logan Bores   On: 12/20/2014 22:35   Dg C-arm 1-60 Min-no Report  12/19/2014   CLINICAL DATA: surgery   C-ARM 1-60 MINUTES  Fluoroscopy was utilized by the requesting physician.  No radiographic  interpretation.    Dg Hip Port Unilat With Pelvis 1v Left  12/19/2014   CLINICAL DATA:  Status post left hip replacement.  EXAM: LEFT HIP (WITH PELVIS) 1 VIEW PORTABLE  COMPARISON:  None.  FINDINGS: A left total hip prosthesis is demonstrated in satisfactory position and alignment. No fracture or dislocation seen.  IMPRESSION: Satisfactory postoperative appearance of a left total hip prosthesis.   Electronically Signed   By: Claudie Revering M.D.   On: 12/19/2014 18:43   Dg Hip Operative Unilat With Pelvis Left  12/19/2014   CLINICAL DATA:  Left hip replacement.  EXAM: DG C-ARM 1-60 MIN - [facility]; OPERATIVE LEFT HIP WITH PELVIS  COMPARISON:  02/24/2012.  FLUOROSCOPY TIME:  Fluoroscopy Time:  0 min 29 seconds  Number of Acquired Images:  2  FINDINGS: Left hip replacement with good anatomic alignment. Hardware intact. No acute bony abnormality P  IMPRESSION: Left hip replacement with good anatomic alignment .   Electronically Signed   By: Marcello Moores  Register   On: 12/19/2014 16:44    Disposition: 01-Home or Self Care      Discharge Instructions    Call MD / Call 911    Complete by:  As directed   If you experience chest pain or shortness of breath, CALL 911 and be transported to the hospital emergency room.  If you develope a fever above 101.5 F, pus (white drainage) or increased drainage or redness at the wound, or calf pain, call your surgeon's office.     Constipation Prevention    Complete by:  As directed   Drink plenty of fluids.  Prune juice may be helpful.  You may use a stool softener, such as Colace (over the counter) 100 mg twice a day.  Use MiraLax (over the counter) for constipation as needed.     Diet - low sodium  heart healthy    Complete by:  As directed      Diet general    Complete by:  As directed      Driving restrictions    Complete by:  As directed   No driving while taking narcotic pain meds.  Increase activity slowly as tolerated    Complete by:  As directed            Follow-up Information    Follow up with Mcarthur Rossetti, MD In 2 weeks.   Specialty:  Orthopedic Surgery   Contact information:   [place] [address] [phone]       Follow up with [facility].   Why:  Home Health Physical Therapy   Contact information:   [employer] [place] [address] [phone]        Signed: Marianna Payment 12/22/2014, 11:29 AM

## 2014-12-22 NOTE — Progress Notes (Signed)
Utilization review completed.  

## 2014-12-22 NOTE — Telephone Encounter (Signed)
Pt was on tcm list d/c 12/21/14 had (L) Hip replacement. Pt will f/u with surgeon  Dr. Jean Rosenthal in 2 weeks. Did not make tcm appt with pcp...Johny Chess

## 2014-12-24 DIAGNOSIS — Z471 Aftercare following joint replacement surgery: Secondary | ICD-10-CM | POA: Diagnosis not present

## 2014-12-24 DIAGNOSIS — Z9181 History of falling: Secondary | ICD-10-CM | POA: Diagnosis not present

## 2014-12-24 DIAGNOSIS — Z96642 Presence of left artificial hip joint: Secondary | ICD-10-CM | POA: Diagnosis not present

## 2014-12-24 DIAGNOSIS — I10 Essential (primary) hypertension: Secondary | ICD-10-CM | POA: Diagnosis not present

## 2014-12-24 DIAGNOSIS — M5137 Other intervertebral disc degeneration, lumbosacral region: Secondary | ICD-10-CM | POA: Diagnosis not present

## 2014-12-24 DIAGNOSIS — G252 Other specified forms of tremor: Secondary | ICD-10-CM | POA: Diagnosis not present

## 2014-12-26 DIAGNOSIS — I10 Essential (primary) hypertension: Secondary | ICD-10-CM | POA: Diagnosis not present

## 2014-12-26 DIAGNOSIS — Z471 Aftercare following joint replacement surgery: Secondary | ICD-10-CM | POA: Diagnosis not present

## 2014-12-26 DIAGNOSIS — M5137 Other intervertebral disc degeneration, lumbosacral region: Secondary | ICD-10-CM | POA: Diagnosis not present

## 2014-12-26 DIAGNOSIS — Z9181 History of falling: Secondary | ICD-10-CM | POA: Diagnosis not present

## 2014-12-26 DIAGNOSIS — Z96642 Presence of left artificial hip joint: Secondary | ICD-10-CM | POA: Diagnosis not present

## 2014-12-26 DIAGNOSIS — G252 Other specified forms of tremor: Secondary | ICD-10-CM | POA: Diagnosis not present

## 2014-12-29 DIAGNOSIS — Z9181 History of falling: Secondary | ICD-10-CM | POA: Diagnosis not present

## 2014-12-29 DIAGNOSIS — I10 Essential (primary) hypertension: Secondary | ICD-10-CM | POA: Diagnosis not present

## 2014-12-29 DIAGNOSIS — Z96642 Presence of left artificial hip joint: Secondary | ICD-10-CM | POA: Diagnosis not present

## 2014-12-29 DIAGNOSIS — M5137 Other intervertebral disc degeneration, lumbosacral region: Secondary | ICD-10-CM | POA: Diagnosis not present

## 2014-12-29 DIAGNOSIS — Z471 Aftercare following joint replacement surgery: Secondary | ICD-10-CM | POA: Diagnosis not present

## 2014-12-29 DIAGNOSIS — G252 Other specified forms of tremor: Secondary | ICD-10-CM | POA: Diagnosis not present

## 2014-12-30 DIAGNOSIS — Z96642 Presence of left artificial hip joint: Secondary | ICD-10-CM | POA: Diagnosis not present

## 2014-12-30 DIAGNOSIS — M1612 Unilateral primary osteoarthritis, left hip: Secondary | ICD-10-CM | POA: Diagnosis not present

## 2014-12-31 ENCOUNTER — Other Ambulatory Visit: Payer: Self-pay | Admitting: Internal Medicine

## 2014-12-31 DIAGNOSIS — I10 Essential (primary) hypertension: Secondary | ICD-10-CM | POA: Diagnosis not present

## 2014-12-31 DIAGNOSIS — M5137 Other intervertebral disc degeneration, lumbosacral region: Secondary | ICD-10-CM | POA: Diagnosis not present

## 2014-12-31 DIAGNOSIS — Z9181 History of falling: Secondary | ICD-10-CM | POA: Diagnosis not present

## 2014-12-31 DIAGNOSIS — G252 Other specified forms of tremor: Secondary | ICD-10-CM | POA: Diagnosis not present

## 2014-12-31 DIAGNOSIS — Z471 Aftercare following joint replacement surgery: Secondary | ICD-10-CM | POA: Diagnosis not present

## 2014-12-31 DIAGNOSIS — Z96642 Presence of left artificial hip joint: Secondary | ICD-10-CM | POA: Diagnosis not present

## 2015-01-02 DIAGNOSIS — Z96642 Presence of left artificial hip joint: Secondary | ICD-10-CM | POA: Diagnosis not present

## 2015-01-02 DIAGNOSIS — M5137 Other intervertebral disc degeneration, lumbosacral region: Secondary | ICD-10-CM | POA: Diagnosis not present

## 2015-01-02 DIAGNOSIS — G252 Other specified forms of tremor: Secondary | ICD-10-CM | POA: Diagnosis not present

## 2015-01-02 DIAGNOSIS — Z471 Aftercare following joint replacement surgery: Secondary | ICD-10-CM | POA: Diagnosis not present

## 2015-01-02 DIAGNOSIS — I10 Essential (primary) hypertension: Secondary | ICD-10-CM | POA: Diagnosis not present

## 2015-01-02 DIAGNOSIS — Z9181 History of falling: Secondary | ICD-10-CM | POA: Diagnosis not present

## 2015-01-05 DIAGNOSIS — Z471 Aftercare following joint replacement surgery: Secondary | ICD-10-CM | POA: Diagnosis not present

## 2015-01-05 DIAGNOSIS — G252 Other specified forms of tremor: Secondary | ICD-10-CM | POA: Diagnosis not present

## 2015-01-05 DIAGNOSIS — M5137 Other intervertebral disc degeneration, lumbosacral region: Secondary | ICD-10-CM | POA: Diagnosis not present

## 2015-01-05 DIAGNOSIS — I10 Essential (primary) hypertension: Secondary | ICD-10-CM | POA: Diagnosis not present

## 2015-01-05 DIAGNOSIS — Z9181 History of falling: Secondary | ICD-10-CM | POA: Diagnosis not present

## 2015-01-05 DIAGNOSIS — Z96642 Presence of left artificial hip joint: Secondary | ICD-10-CM | POA: Diagnosis not present

## 2015-01-07 DIAGNOSIS — Z471 Aftercare following joint replacement surgery: Secondary | ICD-10-CM | POA: Diagnosis not present

## 2015-01-07 DIAGNOSIS — Z96642 Presence of left artificial hip joint: Secondary | ICD-10-CM | POA: Diagnosis not present

## 2015-01-07 DIAGNOSIS — Z9181 History of falling: Secondary | ICD-10-CM | POA: Diagnosis not present

## 2015-01-07 DIAGNOSIS — I10 Essential (primary) hypertension: Secondary | ICD-10-CM | POA: Diagnosis not present

## 2015-01-07 DIAGNOSIS — G252 Other specified forms of tremor: Secondary | ICD-10-CM | POA: Diagnosis not present

## 2015-01-07 DIAGNOSIS — M5137 Other intervertebral disc degeneration, lumbosacral region: Secondary | ICD-10-CM | POA: Diagnosis not present

## 2015-01-13 DIAGNOSIS — I10 Essential (primary) hypertension: Secondary | ICD-10-CM | POA: Diagnosis not present

## 2015-01-13 DIAGNOSIS — G252 Other specified forms of tremor: Secondary | ICD-10-CM | POA: Diagnosis not present

## 2015-01-13 DIAGNOSIS — Z96642 Presence of left artificial hip joint: Secondary | ICD-10-CM | POA: Diagnosis not present

## 2015-01-13 DIAGNOSIS — M5137 Other intervertebral disc degeneration, lumbosacral region: Secondary | ICD-10-CM | POA: Diagnosis not present

## 2015-01-13 DIAGNOSIS — Z471 Aftercare following joint replacement surgery: Secondary | ICD-10-CM | POA: Diagnosis not present

## 2015-01-13 DIAGNOSIS — Z9181 History of falling: Secondary | ICD-10-CM | POA: Diagnosis not present

## 2015-01-24 ENCOUNTER — Other Ambulatory Visit: Payer: Self-pay | Admitting: Internal Medicine

## 2015-01-26 ENCOUNTER — Other Ambulatory Visit: Payer: Self-pay

## 2015-01-26 MED ORDER — AMLODIPINE BESY-BENAZEPRIL HCL 10-20 MG PO CAPS: 1.0000 | ORAL_CAPSULE | Freq: Every day | ORAL | Status: DC

## 2015-01-26 NOTE — Telephone Encounter (Signed)
Amlodipine rx sent to pharm

## 2015-02-18 ENCOUNTER — Inpatient Hospital Stay: Admission: RE | Admit: 2015-02-18 | Payer: Medicare Other | Source: Ambulatory Visit | Admitting: Orthopedic Surgery

## 2015-02-18 ENCOUNTER — Encounter: Admission: RE | Payer: Self-pay | Source: Ambulatory Visit

## 2015-02-18 SURGERY — ARTHROPLASTY, HIP, TOTAL, ANTERIOR APPROACH
Anesthesia: Choice | Laterality: Left

## 2015-04-09 ENCOUNTER — Telehealth: Payer: Self-pay | Admitting: *Deleted

## 2015-04-09 MED ORDER — PROPRANOLOL HCL 10 MG PO TABS: ORAL_TABLET | ORAL | Status: DC

## 2015-04-09 NOTE — Telephone Encounter (Signed)
Left msg on triage stating needing to get refill on his propanolol sent to [facility]. Notified pt refill has been sent to [facility] but will need OV for future refills./lmb

## 2015-05-18 ENCOUNTER — Other Ambulatory Visit: Payer: Self-pay | Admitting: Internal Medicine

## 2015-06-04 DIAGNOSIS — N402 Nodular prostate without lower urinary tract symptoms: Secondary | ICD-10-CM | POA: Diagnosis not present

## 2015-06-04 DIAGNOSIS — N401 Enlarged prostate with lower urinary tract symptoms: Secondary | ICD-10-CM | POA: Diagnosis not present

## 2015-06-04 DIAGNOSIS — N281 Cyst of kidney, acquired: Secondary | ICD-10-CM | POA: Diagnosis not present

## 2015-07-03 ENCOUNTER — Telehealth: Payer: Self-pay

## 2015-07-03 NOTE — Telephone Encounter (Signed)
Call to the patient to schedule AWV; stated he was up in [place] and would be back home next week; Educated on AWV and can call back on Monday to schedule

## 2015-07-06 NOTE — Telephone Encounter (Signed)
Call the patient and he has scheduled with Dr. Linna Darner. States he is not sure if he needs the pnu or flu; educated on prevnar and pneumonia Educated on flu as he got sick when he took it. Educated there is a 4 day incubation period for the flu, also 2 weeks to build immunity and not all flu shots work for certain viruses.   Will discuss with [facility] and consider.

## 2015-07-22 ENCOUNTER — Encounter: Payer: Medicare Other | Admitting: Internal Medicine

## 2015-07-25 ENCOUNTER — Other Ambulatory Visit: Payer: Self-pay | Admitting: Internal Medicine

## 2015-07-27 ENCOUNTER — Other Ambulatory Visit: Payer: Self-pay | Admitting: Emergency Medicine

## 2015-07-27 MED ORDER — AMLODIPINE BESY-BENAZEPRIL HCL 10-20 MG PO CAPS: 1.0000 | ORAL_CAPSULE | Freq: Every day | ORAL | Status: DC

## 2015-07-29 ENCOUNTER — Encounter: Payer: Self-pay | Admitting: Internal Medicine

## 2015-07-29 ENCOUNTER — Ambulatory Visit (INDEPENDENT_AMBULATORY_CARE_PROVIDER_SITE_OTHER): Payer: Medicare Other | Admitting: Internal Medicine

## 2015-07-29 ENCOUNTER — Other Ambulatory Visit (INDEPENDENT_AMBULATORY_CARE_PROVIDER_SITE_OTHER): Payer: Medicare Other

## 2015-07-29 VITALS — BP 142/74 | HR 58 | Temp 98.5°F | Resp 16 | Wt 148.0 lb

## 2015-07-29 DIAGNOSIS — E785 Hyperlipidemia, unspecified: Secondary | ICD-10-CM

## 2015-07-29 DIAGNOSIS — R413 Other amnesia: Secondary | ICD-10-CM | POA: Diagnosis not present

## 2015-07-29 DIAGNOSIS — Z0000 Encounter for general adult medical examination without abnormal findings: Secondary | ICD-10-CM

## 2015-07-29 DIAGNOSIS — G250 Essential tremor: Secondary | ICD-10-CM

## 2015-07-29 DIAGNOSIS — R7309 Other abnormal glucose: Secondary | ICD-10-CM

## 2015-07-29 DIAGNOSIS — I10 Essential (primary) hypertension: Secondary | ICD-10-CM

## 2015-07-29 DIAGNOSIS — Z23 Encounter for immunization: Secondary | ICD-10-CM | POA: Diagnosis not present

## 2015-07-29 LAB — CBC WITH DIFFERENTIAL/PLATELET
BASOS ABS: 0 10*3/uL (ref 0.0–0.1)
Basophils Relative: 0.4 % (ref 0.0–3.0)
EOS PCT: 1.7 % (ref 0.0–5.0)
Eosinophils Absolute: 0.2 10*3/uL (ref 0.0–0.7)
HEMATOCRIT: 43.4 % (ref 39.0–52.0)
Hemoglobin: 14.4 g/dL (ref 13.0–17.0)
LYMPHS ABS: 2 10*3/uL (ref 0.7–4.0)
LYMPHS PCT: 21.4 % (ref 12.0–46.0)
MCHC: 33.2 g/dL (ref 30.0–36.0)
MCV: 90.3 fl (ref 78.0–100.0)
MONOS PCT: 7.3 % (ref 3.0–12.0)
Monocytes Absolute: 0.7 10*3/uL (ref 0.1–1.0)
Neutro Abs: 6.4 10*3/uL (ref 1.4–7.7)
Neutrophils Relative %: 69.2 % (ref 43.0–77.0)
PLATELETS: 249 10*3/uL (ref 150.0–400.0)
RBC: 4.8 Mil/uL (ref 4.22–5.81)
RDW: 13 % (ref 11.5–15.5)
WBC: 9.3 10*3/uL (ref 4.0–10.5)

## 2015-07-29 LAB — VITAMIN B12: VITAMIN B 12: 515 pg/mL (ref 211–911)

## 2015-07-29 LAB — COMPREHENSIVE METABOLIC PANEL
ALBUMIN: 4.1 g/dL (ref 3.5–5.2)
ALT: 15 U/L (ref 0–53)
AST: 15 U/L (ref 0–37)
Alkaline Phosphatase: 60 U/L (ref 39–117)
BUN: 17 mg/dL (ref 6–23)
CALCIUM: 9.3 mg/dL (ref 8.4–10.5)
CO2: 27 meq/L (ref 19–32)
Chloride: 103 mEq/L (ref 96–112)
Creatinine, Ser: 1.16 mg/dL (ref 0.40–1.50)
GFR: 63.65 mL/min (ref >60.00)
Glucose, Bld: 103 mg/dL — ABNORMAL HIGH (ref 70–99)
Potassium: 4.4 mEq/L (ref 3.5–5.1)
Sodium: 138 mEq/L (ref 135–145)
Total Bilirubin: 0.6 mg/dL (ref 0.2–1.2)
Total Protein: 7 g/dL (ref 6.0–8.3)

## 2015-07-29 LAB — LIPID PANEL
CHOLESTEROL: 196 mg/dL (ref 0–200)
HDL: 75 mg/dL (ref >39.00)
LDL Cholesterol: 110 mg/dL — ABNORMAL HIGH (ref 0–99)
NonHDL: 120.91
TRIGLYCERIDES: 57 mg/dL (ref 0.0–149.0)
Total CHOL/HDL Ratio: 3
VLDL: 11.4 mg/dL (ref 0.0–40.0)

## 2015-07-29 LAB — TSH: TSH: 3.23 u[IU]/mL (ref 0.35–4.50)

## 2015-07-29 NOTE — Patient Instructions (Addendum)
Roger Ingram , Thank you for taking time to come for your Medicare Wellness Visit. I appreciate your ongoing commitment to your health goals. Please review the following plan we discussed and let me know if I can assist you in the future.   These are the goals we discussed: Goals    None      This is a list of the screening recommended for you and due dates:  Health Maintenance  Topic Date Due  . Tetanus Vaccine  12/02/1949  . Flu Shot  03/15/2016*  . Pneumonia vaccines (2 of 2 - PPSV23) 07/28/2016  *Topic was postponed. The date shown is not the original due date.   Health Maintenance, Male A healthy lifestyle and preventative care can promote health and wellness.  Maintain regular health, dental, and eye exams.  Eat a healthy diet. Foods like vegetables, fruits, whole grains, low-fat dairy products, and lean protein foods contain the nutrients you need and are low in calories. Decrease your intake of foods high in solid fats, added sugars, and salt. Get information about a proper diet from your health care provider, if necessary.  Regular physical exercise is one of the most important things you can do for your health. Most adults should get at least 150 minutes of moderate-intensity exercise (any activity that increases your heart rate and causes you to sweat) each week. In addition, most adults need muscle-strengthening exercises on 2 or more days a week.   Maintain a healthy weight. The body mass index (BMI) is a screening tool to identify possible weight problems. It provides an estimate of body fat based on height and weight. Your health care provider can find your BMI and can help you achieve or maintain a healthy weight. For males 20 years and older:  A BMI below 18.5 is considered underweight.  A BMI of 18.5 to 24.9 is normal.  A BMI of 25 to 29.9 is considered overweight.  A BMI of 30 and above is considered obese.  Maintain normal blood lipids and cholesterol by  exercising and minimizing your intake of saturated fat. Eat a balanced diet with plenty of fruits and vegetables. Blood tests for lipids and cholesterol should begin at age 9 and be repeated every 5 years. If your lipid or cholesterol levels are high, you are over age 61, or you are at high risk for heart disease, you may need your cholesterol levels checked more frequently.Ongoing high lipid and cholesterol levels should be treated with medicines if diet and exercise are not working.  If you smoke, find out from your health care provider how to quit. If you do not use tobacco, do not start.  Lung cancer screening is recommended for adults aged 74-80 years who are at high risk for developing lung cancer because of a history of smoking. A yearly low-dose CT scan of the lungs is recommended for people who have at least a 30-pack-year history of smoking and are current smokers or have quit within the past 15 years. A pack year of smoking is smoking an average of 1 pack of cigarettes a day for 1 year (for example, a 30-pack-year history of smoking could mean smoking 1 pack a day for 30 years or 2 packs a day for 15 years). Yearly screening should continue until the smoker has stopped smoking for at least 15 years. Yearly screening should be stopped for people who develop a health problem that would prevent them from having lung cancer treatment.  If you  choose to drink alcohol, do not have more than 2 drinks per day. One drink is considered to be 12 oz (360 mL) of beer, 5 oz (150 mL) of wine, or 1.5 oz (45 mL) of liquor.  Avoid the use of street drugs. Do not share needles with anyone. Ask for help if you need support or instructions about stopping the use of drugs.  High blood pressure causes heart disease and increases the risk of stroke. High blood pressure is more likely to develop in:  People who have blood pressure in the end of the normal range (100-139/85-89 mm Hg).  People who are overweight or  obese.  People who are African American.  If you are 40-31 years of age, have your blood pressure checked every 3-5 years. If you are 56 years of age or older, have your blood pressure checked every year. You should have your blood pressure measured twice--once when you are at a hospital or clinic, and once when you are not at a hospital or clinic. Record the average of the two measurements. To check your blood pressure when you are not at a hospital or clinic, you can use:  An automated blood pressure machine at a pharmacy.  A home blood pressure monitor.  If you are 85-74 years old, ask your health care provider if you should take aspirin to prevent heart disease.  Diabetes screening involves taking a blood sample to check your fasting blood sugar level. This should be done once every 3 years after age 81 if you are at a normal weight and without risk factors for diabetes. Testing should be considered at a younger age or be carried out more frequently if you are overweight and have at least 1 risk factor for diabetes.  Colorectal cancer can be detected and often prevented. Most routine colorectal cancer screening begins at the age of 26 and continues through age 66. However, your health care provider may recommend screening at an earlier age if you have risk factors for colon cancer. On a yearly basis, your health care provider may provide home test kits to check for hidden blood in the stool. A small camera at the end of a tube may be used to directly examine the colon (sigmoidoscopy or colonoscopy) to detect the earliest forms of colorectal cancer. Talk to your health care provider about this at age 44 when routine screening begins. A direct exam of the colon should be repeated every 5-10 years through age 85, unless early forms of precancerous polyps or small growths are found.  People who are at an increased risk for hepatitis B should be screened for this virus. You are considered at high risk  for hepatitis B if:  You were born in a country where hepatitis B occurs often. Talk with your health care provider about which countries are considered high risk.  Your parents were born in a high-risk country and you have not received a shot to protect against hepatitis B (hepatitis B vaccine).  You have HIV or AIDS.  You use needles to inject street drugs.  You live with, or have sex with, someone who has hepatitis B.  You are a man who has sex with other men (MSM).  You get hemodialysis treatment.  You take certain medicines for conditions like cancer, organ transplantation, and autoimmune conditions.  Hepatitis C blood testing is recommended for all people born from [birth year] through 1963 and any individual with known risk factors for hepatitis C.  Healthy  men should no longer receive prostate-specific antigen (PSA) blood tests as part of routine cancer screening. Talk to your health care provider about prostate cancer screening.  Testicular cancer screening is not recommended for adolescents or adult males who have no symptoms. Screening includes self-exam, a health care provider exam, and other screening tests. Consult with your health care provider about any symptoms you have or any concerns you have about testicular cancer.  Practice safe sex. Use condoms and avoid high-risk sexual practices to reduce the spread of sexually transmitted infections (STIs).  You should be screened for STIs, including gonorrhea and chlamydia if:  You are sexually active and are younger than 24 years.  You are older than 24 years, and your health care provider tells you that you are at risk for this type of infection.  Your sexual activity has changed since you were last screened, and you are at an increased risk for chlamydia or gonorrhea. Ask your health care provider if you are at risk.  If you are at risk of being infected with HIV, it is recommended that you take a prescription medicine daily to  prevent HIV infection. This is called pre-exposure prophylaxis (PrEP). You are considered at risk if:  You are a man who has sex with other men (MSM).  You are a heterosexual man who is sexually active with multiple partners.  You take drugs by injection.  You are sexually active with a partner who has HIV.  Talk with your health care provider about whether you are at high risk of being infected with HIV. If you choose to begin PrEP, you should first be tested for HIV. You should then be tested every 3 months for as long as you are taking PrEP.  Use sunscreen. Apply sunscreen liberally and repeatedly throughout the day. You should seek shade when your shadow is shorter than you. Protect yourself by wearing long sleeves, pants, a wide-brimmed hat, and sunglasses year round whenever you are outdoors.  Tell your health care provider of new moles or changes in moles, especially if there is a change in shape or color. Also, tell your health care provider if a mole is larger than the size of a pencil eraser.  A one-time screening for abdominal aortic aneurysm (AAA) and surgical repair of large AAAs by ultrasound is recommended for men aged 67-75 years who are current or former smokers.  Stay current with your vaccines (immunizations).   This information is not intended to replace advice given to you by your health care provider. Make sure you discuss any questions you have with your health care provider.   Document Released: 01/28/2008 Document Revised: 08/22/2014 Document Reviewed: 12/27/2010 Elsevier Interactive Patient Education [employer].

## 2015-07-29 NOTE — Progress Notes (Signed)
Pre visit review using our clinic review tool, if applicable. No additional management support is needed unless otherwise documented below in the visit note. 

## 2015-07-29 NOTE — Progress Notes (Signed)
Subjective:    Patient ID: Roger Ingram, male    DOB: [birth year], 81 y.o.   MRN: [id number]  HPI He is here to establish with a new pcp.    Here for medicare wellness.   His intention tremor is slightly worse.  He is taking the propranolol daily and it has helped, the tremor is worse if he forgets that was. He was wondering if there is anything else that might help.  I have personally reviewed and have noted 1.The patient's medical and social history 2.Their use of alcohol, tobacco or illicit drugs 3.Their current medications and supplements 4.The patient's functional ability including ADL's, fall risks, home safety risks and hearing or visual impairment. 5.Diet and physical activities 6.Evidence for depression or mood disorders 7.Care team reviewed and updated:                Urology: Dr Tresa Moore                Derm: Dr Allyson Sabal   Are there smokers in your home (other than you)? No  Risk Factors Exercise: walks dog twice daily Dietary issues discussed: heart helathy diet  Cardiac risk factors: advanced age   Depression Screen  Have you felt down, depressed or hopeless? No  Have you felt little interest or pleasure in doing things?  No  Activities of Daily Living In your present state of health, do you have any difficulty performing the following activities?:  Driving? Yes Managing money?  Yes Feeding yourself? Yes Getting from bed to chair? Yes Climbing a flight of stairs? Yes Preparing food and eating?: Yes Bathing or showering? Yes Getting dressed: Yes Getting to/using the toilet? Yes Moving around from place to place: Yes In the past year have you fallen or had a near fall?: Yes, just after hip replacement, missed step   Are you sexually active?  No  Do you have more than one partner?  N/A  Hearing Difficulties:  Yes Do you often ask people to speak up or repeat themselves? Yes Do you experience  ringing or noises in your ears? Yes Do you have difficulty understanding soft or whispered voices? Yes Vision:              Any change in vision: No              Up to date with eye exam: No - will schedule  Memory:  Do you feel that you have a problem with memory? Yes, some short term memory concerns, which he feels is normal for his age. His friends that at the same age have similar problems. He is not concerned.  Do you often misplace items?  No  Do you feel safe at home?  Yes  Cognitive Testing  Alert, Orientated? Yes  Normal Appearance? Yes  Recall of three objects?  Yes  Can perform simple calculations? Yes, just takes longer  Displays appropriate judgment? Yes  Can read the correct time from a watch face? Yes   Advanced Directives have been discussed with the patient? Yes   Medications and allergies reviewed with patient and not updates.  Patient Active Problem List   Diagnosis Date Noted  . Osteoarthritis of left hip 12/19/2014  . Status post total replacement of left hip 12/19/2014  . Degenerative joint disease (DJD) of hip 11/13/2014  . Popliteal cyst, unruptured 06/17/2014  . DDD (degenerative disc disease), lumbosacral 10/26/2013  . NOCTURIA 12/28/2009  . Essential hypertension 07/06/2009  . FASTING  HYPERGLYCEMIA 10/23/2008  . Hyperlipidemia 10/02/2008  . INTENTION TREMOR 10/02/2008  . Diverticulosis of large intestine 10/02/2008  . NEURITIS 10/02/2008  . MALIGNANT MELANOMA, HX OF 10/02/2008  . SKIN CANCER, HX OF 10/02/2008  . History of colonic polyps 10/02/2008  . ELEVATED PROSTATE SPECIFIC ANTIGEN 09/08/2008  . HYPERPLASIA PROSTATE UNS W/UR OBST & OTH LUTS 06/02/2008    Current Outpatient Prescriptions on File Prior to Visit  Medication Sig Dispense Refill  . amLODipine-benazepril (LOTREL) 10-20 MG capsule Take 1 capsule by mouth daily. --- Pt should establish with new PCP for further refills. 90 capsule 0  . finasteride (PROSCAR) 5 MG tablet TAKE ONE  TABLET BY MOUTH ONCE DAILY 90 tablet 1  . folic acid (FOLVITE) A999333 MCG tablet Take 400 mcg by mouth daily.    . propranolol (INDERAL) 10 MG tablet TAKE ONE TABLET BY MOUTH TWICE DAILY FOR  ESSENTIAL  TREMOR 180 tablet 0  . tamsulosin (FLOMAX) 0.4 MG CAPS capsule TAKE ONE CAPSULE BY MOUTH ONCE DAILY 90 capsule 1   No current facility-administered medications on file prior to visit.    Past Medical History  Diagnosis Date  . Basal cell cancer   . Melanoma   . Hx of colonic polyps   . Elevated PSA     [facility]  . Diverticulosis of colon   . Hyperlipidemia   . BPH (benign prostatic hypertrophy)     Dr Junious Silk  . Hypertension   . History of kidney stones   . Chronic kidney disease     Hx of clear fluid sacs on both kidneys. Drained about 2006    Past Surgical History  Procedure Laterality Date  . Colonoscopy w/ polypectomy  1997, 2011    Dr Earlean Shawl  . Basal cell      R  & L temple; Dr Allyson Sabal  . Melanoma      L forehead  . Right rotator cuff sx    . Total hip arthroplasty Left 12/19/2014    Procedure: LEFT TOTAL HIP ARTHROPLASTY ANTERIOR APPROACH;  Surgeon: Mcarthur Rossetti, MD;  Location: [facility];  Service: Orthopedics;  Laterality: Left;    Social History   Social History  . Marital Status: Married    Spouse Name: N/A  . Number of Children: N/A  . Years of Education: N/A   Social History Main Topics  . Smoking status: Never Smoker   . Smokeless tobacco: Not on file  . Alcohol Use: No  . Drug Use: No  . Sexual Activity: Not on file   Other Topics Concern  . Not on file   Social History Narrative    Review of Systems  Constitutional: Negative for fever, chills and fatigue.  HENT: Positive for hearing loss.   Eyes: Negative for visual disturbance.  Respiratory: Positive for cough (occasional -? medication). Negative for shortness of breath and wheezing.   Cardiovascular: Positive for palpitations (occasionally, more at night, few seconds, skipped  beat every 5-6 beats). Negative for chest pain and leg swelling.  Gastrointestinal: Negative for nausea, abdominal pain, diarrhea, constipation and blood in stool.       Occ GERD  Endocrine: Positive for cold intolerance.  Genitourinary: Positive for frequency (nocturia).  Neurological: Negative for dizziness, weakness, light-headedness, numbness and headaches.  Psychiatric/Behavioral: Negative for dysphoric mood. The patient is not nervous/anxious.        Objective:   Filed Vitals:   07/29/15 0801  BP: 142/74  Pulse: 58  Temp: 98.5 F (36.9 C)  Resp: 16   Filed Weights   07/29/15 0801  Weight: 148 lb (67.132 kg)   Body mass index is 22.51 kg/(m^2).   Physical Exam  Constitutional: He is oriented to person, place, and time. He appears well-developed and well-nourished. No distress.  HENT:  Head: Normocephalic and atraumatic.  Right Ear: External ear normal.  Left Ear: External ear normal.  Normal bilateral ear canals, scarred right tympanic membrane, normal tympanic membrane on left  Eyes: Conjunctivae and EOM are normal.  Neck: Neck supple. No tracheal deviation present. No thyromegaly present.  No carotid bruit  Cardiovascular: Normal rate, regular rhythm and normal heart sounds.   No murmur heard. Pulmonary/Chest: Effort normal and breath sounds normal. No respiratory distress. He has no wheezes. He has no rales.  Abdominal: Soft. He exhibits no distension. There is no tenderness.  Musculoskeletal: He exhibits no edema.  Lymphadenopathy:    He has no cervical adenopathy.  Neurological: He is alert and oriented to person, place, and time.  Skin: Skin is warm and dry. No rash noted. He is not diaphoretic.  Psychiatric: He has a normal mood and affect. His behavior is normal.          Assessment & Plan:   Wellness exam: Screening blood work ordered Immunizations:  prevnar today, declined flu, discussed tetanus vaccines; had shingles  Colonoscopy done 2011,  given his age may not need another one  -he will discuss with GI given his history of polyps EKG done earlier this year Skin exams: sees derm Eye exams: overdue - will schedule Depression screen negative Minor memory concerns - likely normal for his age, we will just monitor for now.  Continue healthy lifestyle  Continue regular exercise and healthy diet  See problem list for review of chronic problems

## 2015-07-30 NOTE — Assessment & Plan Note (Signed)
Propranolol has been effective Tremor has gotten slightly worse Discussed possibly increasing propranolol to 20 mg twice daily-with need to monitor heart rate At this point he feels he will stick with his current dose, but may try 20 mg on occasion

## 2015-07-30 NOTE — Assessment & Plan Note (Signed)
Lipid panel has been controlled with lifestyle Recheck CMP, lipid panel Continue regular exercise and healthy diet

## 2015-07-30 NOTE — Assessment & Plan Note (Signed)
BP Readings from Last 3 Encounters:  07/29/15 142/74  12/21/14 166/60  12/09/14 144/54   He does monitor his blood pressure-typically well controlled Continue current medication Check CMP

## 2015-07-30 NOTE — Assessment & Plan Note (Signed)
Check fasting sugar Healthy diet, regular exercise

## 2015-08-13 ENCOUNTER — Other Ambulatory Visit: Payer: Self-pay | Admitting: Internal Medicine

## 2015-08-31 DIAGNOSIS — M1612 Unilateral primary osteoarthritis, left hip: Secondary | ICD-10-CM | POA: Diagnosis not present

## 2015-08-31 DIAGNOSIS — Z96642 Presence of left artificial hip joint: Secondary | ICD-10-CM | POA: Diagnosis not present

## 2015-09-30 ENCOUNTER — Telehealth: Payer: Self-pay | Admitting: Internal Medicine

## 2015-09-30 DIAGNOSIS — H9190 Unspecified hearing loss, unspecified ear: Secondary | ICD-10-CM

## 2015-09-30 NOTE — Telephone Encounter (Signed)
Patient called to request that we place a referral to [facility] for an evaluation for hearing aid. Fax # [phone]  appt is set for 10/15/2015 @ 1pm

## 2015-10-03 NOTE — Telephone Encounter (Signed)
Please advise 

## 2015-10-04 NOTE — Telephone Encounter (Signed)
ordered

## 2015-10-05 NOTE — Telephone Encounter (Signed)
Spoke with pt to inform.  

## 2015-10-13 ENCOUNTER — Telehealth: Payer: Self-pay | Admitting: Internal Medicine

## 2015-10-13 NOTE — Telephone Encounter (Signed)
Pt called in and said that he has an appt on the 2nd of march and they have not gotten the referral yet?  Can you look at this one?    Fax number [phone]

## 2015-10-14 NOTE — Telephone Encounter (Signed)
Referral faxed to [facility].

## 2015-10-14 NOTE — Telephone Encounter (Signed)
Pt aware.

## 2015-10-15 DIAGNOSIS — H9311 Tinnitus, right ear: Secondary | ICD-10-CM | POA: Diagnosis not present

## 2015-10-15 DIAGNOSIS — H903 Sensorineural hearing loss, bilateral: Secondary | ICD-10-CM | POA: Diagnosis not present

## 2015-10-23 ENCOUNTER — Other Ambulatory Visit: Payer: Self-pay | Admitting: Internal Medicine

## 2015-11-23 DIAGNOSIS — Z85820 Personal history of malignant melanoma of skin: Secondary | ICD-10-CM | POA: Diagnosis not present

## 2015-11-23 DIAGNOSIS — L812 Freckles: Secondary | ICD-10-CM | POA: Diagnosis not present

## 2015-11-23 DIAGNOSIS — D1801 Hemangioma of skin and subcutaneous tissue: Secondary | ICD-10-CM | POA: Diagnosis not present

## 2015-11-23 DIAGNOSIS — L821 Other seborrheic keratosis: Secondary | ICD-10-CM | POA: Diagnosis not present

## 2016-02-24 ENCOUNTER — Other Ambulatory Visit: Payer: Self-pay | Admitting: Emergency Medicine

## 2016-02-24 MED ORDER — FINASTERIDE 5 MG PO TABS: 5.0000 mg | ORAL_TABLET | Freq: Every day | ORAL | Status: DC

## 2016-02-29 DIAGNOSIS — H52203 Unspecified astigmatism, bilateral: Secondary | ICD-10-CM | POA: Diagnosis not present

## 2016-02-29 DIAGNOSIS — H35373 Puckering of macula, bilateral: Secondary | ICD-10-CM | POA: Diagnosis not present

## 2016-02-29 DIAGNOSIS — H02105 Unspecified ectropion of left lower eyelid: Secondary | ICD-10-CM | POA: Diagnosis not present

## 2016-02-29 DIAGNOSIS — H2513 Age-related nuclear cataract, bilateral: Secondary | ICD-10-CM | POA: Diagnosis not present

## 2016-03-25 DIAGNOSIS — H43812 Vitreous degeneration, left eye: Secondary | ICD-10-CM | POA: Diagnosis not present

## 2016-04-14 ENCOUNTER — Other Ambulatory Visit: Payer: Self-pay

## 2016-05-29 NOTE — Progress Notes (Signed)
Subjective:    Patient ID: Roger Ingram, male    DOB: October 03, 1928, 82 y.o.   MRN: [id number]  HPI He is here for an acute visit.   Shaking in right hand:  It started in the 1990's and it gradually got worse.  Dr Linna Darner tried him on propranolol and it has helped.  He now has it in the left hand as well.  He has seen a neurologist in the past, Dr ?  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  His daughter and wife want him to have it checked out -his daughter thought he should see Dr. Roque Lias.  It does not both him most of the time, but sometimes it bothers him a lot.  It bothers him when he has to do something that has to be exact - writing, putting a key in a lock, eating soup - using fine movement.  He denies any shaking at rest.  He denies any weakness in his arms.  He wonders about the possibility of Parkinson's disease.  He has gurgling in his abdomen.  He has had a couple of dark - black colored stools.  He has diarrhea daily.  He has some pain in her left side.  He did take kaopectate for the diarrhea a couple of times and it helped.  He thinks that may have correlated with the black stools.  He denies fever.  He has lost 5 lbs in the past year or so.      Medications and allergies reviewed with patient and updated if appropriate.  Patient Active Problem List   Diagnosis Date Noted  . Osteoarthritis of left hip 12/19/2014  . Status post total replacement of left hip 12/19/2014  . Degenerative joint disease (DJD) of hip 11/13/2014  . Popliteal cyst, unruptured 06/17/2014  . DDD (degenerative disc disease), lumbosacral 10/26/2013  . NOCTURIA 12/28/2009  . Essential hypertension 07/06/2009  . FASTING HYPERGLYCEMIA 10/23/2008  . Hyperlipidemia 10/02/2008  . Essential tremor 10/02/2008  . Diverticulosis of large intestine 10/02/2008  . NEURITIS 10/02/2008  . MALIGNANT MELANOMA, HX OF 10/02/2008  . SKIN CANCER, HX OF 10/02/2008  . History of colonic polyps 10/02/2008    . ELEVATED PROSTATE SPECIFIC ANTIGEN 09/08/2008  . HYPERPLASIA PROSTATE UNS W/UR OBST & OTH LUTS 06/02/2008    Current Outpatient Prescriptions on File Prior to Visit  Medication Sig Dispense Refill  . amLODipine-benazepril (LOTREL) 10-20 MG capsule Take 1 capsule by mouth daily. 90 capsule 3  . aspirin EC 81 MG tablet Take 81 mg by mouth daily.    . finasteride (PROSCAR) 5 MG tablet Take 1 tablet (5 mg total) by mouth daily. 90 tablet 1  . folic acid (FOLVITE) A999333 MCG tablet Take 400 mcg by mouth daily.    . propranolol (INDERAL) 10 MG tablet TAKE ONE TABLET BY MOUTH TWICE DAILY FOR  ESSENTIAL  TREMOR 180 tablet 3  . tamsulosin (FLOMAX) 0.4 MG CAPS capsule TAKE ONE CAPSULE BY MOUTH ONCE DAILY 90 capsule 1   No current facility-administered medications on file prior to visit.     Past Medical History:  Diagnosis Date  . Basal cell cancer   . BPH (benign prostatic hypertrophy)    Dr Junious Silk  . Chronic kidney disease    Hx of clear fluid sacs on both kidneys. Drained about 2006  . Diverticulosis of colon   . Elevated PSA    [facility]  . History of kidney stones   .  Hx of colonic polyps   . Hyperlipidemia   . Hypertension   . Melanoma [facility])     Past Surgical History:  Procedure Laterality Date  . basal cell     R  & L temple; Dr Allyson Sabal  . COLONOSCOPY W/ POLYPECTOMY  1997, 2011   Dr Earlean Shawl  . melanoma     L forehead  . Right rotator cuff sx    . TOTAL HIP ARTHROPLASTY Left 12/19/2014   Procedure: LEFT TOTAL HIP ARTHROPLASTY ANTERIOR APPROACH;  Surgeon: Mcarthur Rossetti, MD;  Location: [facility];  Service: Orthopedics;  Laterality: Left;    Social History   Social History  . Marital status: Married    Spouse name: N/A  . Number of children: N/A  . Years of education: N/A   Social History Main Topics  . Smoking status: Never Smoker  . Smokeless tobacco: Never Used  . Alcohol use 0.0 oz/week     Comment: occasional  . Drug use: No  . Sexual activity:  Not on file   Other Topics Concern  . Not on file   Social History Narrative   Walks twice daily for exercise    Family History  Problem Relation Age of Onset  . Stroke Mother 48  . Heart attack Father 21  . Stroke Father   . Colon cancer Brother 59  . Stroke Paternal Grandfather     in 52s  . Diabetes Son     IDDM  . Other Sister     addisons disease  . Dementia Sister   . Stroke Brother     Review of Systems  Constitutional: Positive for unexpected weight change (5 lbs since 07/2015). Negative for appetite change, chills and fever.  Gastrointestinal: Positive for abdominal pain, diarrhea and nausea (a few times). Negative for blood in stool (black stool).       GERD at night, occasional  Neurological: Positive for tremors. Negative for weakness, light-headedness, numbness and headaches.       Objective:   Vitals:   05/30/16 1509  BP: (!) 152/70  Pulse: (!) 55  Resp: 16  Temp: 98 F (36.7 C)   Filed Weights   05/30/16 1509  Weight: 143 lb (64.9 kg)   Body mass index is 21.74 kg/m.   Physical Exam  Constitutional: He appears well-developed and well-nourished. No distress.  Abdominal: Soft. He exhibits no distension and no mass. There is tenderness (mild tenderness in LUQ). There is no rebound and no guarding.  Musculoskeletal: He exhibits no edema.  Normal strength without rigidity in UE b/l  Neurological:  Normal strength and sensation UE b/l  Skin: Skin is warm and dry. He is not diaphoretic.  Psychiatric: He has a normal mood and affect. His behavior is normal.          Assessment & Plan:   See Problem List for Assessment and Plan of chronic medical problems.

## 2016-05-30 ENCOUNTER — Other Ambulatory Visit (INDEPENDENT_AMBULATORY_CARE_PROVIDER_SITE_OTHER): Payer: Medicare Other

## 2016-05-30 ENCOUNTER — Ambulatory Visit (INDEPENDENT_AMBULATORY_CARE_PROVIDER_SITE_OTHER): Payer: Medicare Other | Admitting: Internal Medicine

## 2016-05-30 ENCOUNTER — Encounter: Payer: Self-pay | Admitting: Internal Medicine

## 2016-05-30 VITALS — BP 152/70 | HR 55 | Temp 98.0°F | Resp 16 | Ht 68.0 in | Wt 143.0 lb

## 2016-05-30 DIAGNOSIS — R1012 Left upper quadrant pain: Secondary | ICD-10-CM | POA: Diagnosis not present

## 2016-05-30 DIAGNOSIS — G250 Essential tremor: Secondary | ICD-10-CM

## 2016-05-30 LAB — CBC WITH DIFFERENTIAL/PLATELET
BASOS PCT: 0.5 % (ref 0.0–3.0)
Basophils Absolute: 0 10*3/uL (ref 0.0–0.1)
EOS ABS: 0.2 10*3/uL (ref 0.0–0.7)
Eosinophils Relative: 2.1 % (ref 0.0–5.0)
HCT: 39.9 % (ref 39.0–52.0)
HEMOGLOBIN: 13.3 g/dL (ref 13.0–17.0)
Lymphocytes Relative: 36.4 % (ref 12.0–46.0)
Lymphs Abs: 2.6 10*3/uL (ref 0.7–4.0)
MCHC: 33.2 g/dL (ref 30.0–36.0)
MCV: 90.2 fl (ref 78.0–100.0)
MONO ABS: 0.8 10*3/uL (ref 0.1–1.0)
Monocytes Relative: 11 % (ref 3.0–12.0)
NEUTROS ABS: 3.6 10*3/uL (ref 1.4–7.7)
Neutrophils Relative %: 50 % (ref 43.0–77.0)
PLATELETS: 211 10*3/uL (ref 150.0–400.0)
RBC: 4.43 Mil/uL (ref 4.22–5.81)
RDW: 13.3 % (ref 11.5–15.5)
WBC: 7.2 10*3/uL (ref 4.0–10.5)

## 2016-05-30 LAB — COMPREHENSIVE METABOLIC PANEL
ALT: 16 U/L (ref 0–53)
AST: 17 U/L (ref 0–37)
Albumin: 4.3 g/dL (ref 3.5–5.2)
Alkaline Phosphatase: 51 U/L (ref 39–117)
BUN: 28 mg/dL — AB (ref 6–23)
CHLORIDE: 103 meq/L (ref 96–112)
CO2: 28 meq/L (ref 19–32)
CREATININE: 1.28 mg/dL (ref 0.40–1.50)
Calcium: 9.7 mg/dL (ref 8.4–10.5)
GFR: 56.7 mL/min — ABNORMAL LOW (ref >60.00)
Glucose, Bld: 108 mg/dL — ABNORMAL HIGH (ref 70–99)
Potassium: 4.7 mEq/L (ref 3.5–5.1)
SODIUM: 139 meq/L (ref 135–145)
Total Bilirubin: 0.4 mg/dL (ref 0.2–1.2)
Total Protein: 7 g/dL (ref 6.0–8.3)

## 2016-05-30 LAB — AMYLASE: Amylase: 35 U/L (ref 27–131)

## 2016-05-30 LAB — LIPASE: Lipase: 24 U/L (ref 11.0–59.0)

## 2016-05-30 NOTE — Assessment & Plan Note (Addendum)
Some diarrhea and LUQ pain Abdomen mildly tender Black stool - likely from kaopectate Will check guaic, cbc, cmp, amylase, lipase [place] of abd - may need Ct scan May need referral to GI - will await above

## 2016-05-30 NOTE — Progress Notes (Signed)
Pre visit review using our clinic review tool, if applicable. No additional management support is needed unless otherwise documented below in the visit note. 

## 2016-05-30 NOTE — Patient Instructions (Signed)
Blood work and an abdominal ultrasound was ordered  Test(s) ordered today. Your results will be released to [facility] (or called to you) after review, usually within 72hours after test completion. If any changes need to be made, you will be notified at that same time.  We will call you to schedule an abdominal ultrasound.   Medications reviewed and updated.  No changes recommended at this time.  Please send in the stool cards.   A referral was ordered for neurology.

## 2016-05-30 NOTE — Assessment & Plan Note (Signed)
Concern over tremor which has worsened over the years Symptoms classic for essential tremor Propranolol has been helpful - on a very low dose - we could increase Concern about parkinson's - reassured him his symptoms are not suggestive of parkinson's Would like to see neuro - will refer to Dr Roque Lias per his request

## 2016-06-08 ENCOUNTER — Telehealth: Payer: Self-pay | Admitting: Internal Medicine

## 2016-06-08 NOTE — Telephone Encounter (Signed)
Patient called in regarding recent lab results. He seems to be under the impression that he needs to go to a urologist, but there are no notes about this. Please call the patient to clarify

## 2016-06-08 NOTE — Telephone Encounter (Signed)
Please advise if pt needed to see urology.

## 2016-06-08 NOTE — Telephone Encounter (Signed)
Spoke with pt to inform, Pt is going to call urology and schedule follow-up

## 2016-06-08 NOTE — Telephone Encounter (Signed)
I was under the impression he was seeing urology for routine follow up of his elevated psa - ?  His last visit we discussed him possibly seeing GI for his stomach issues, but we were going to wait to see what the US showed (scheduled tomorrow)

## 2016-06-09 ENCOUNTER — Ambulatory Visit
Admission: RE | Admit: 2016-06-09 | Discharge: 2016-06-09 | Disposition: A | Discharge status: Home / Self Care | Payer: Medicare Other | Source: Ambulatory Visit | Attending: Internal Medicine | Admitting: Internal Medicine

## 2016-06-09 DIAGNOSIS — R1012 Left upper quadrant pain: Secondary | ICD-10-CM

## 2016-06-09 DIAGNOSIS — N281 Cyst of kidney, acquired: Secondary | ICD-10-CM | POA: Diagnosis not present

## 2016-06-13 ENCOUNTER — Telehealth: Payer: Self-pay | Admitting: Internal Medicine

## 2016-06-13 NOTE — Telephone Encounter (Signed)
Pt request result of the ultrasound. Please call him back, he also stating he has more information to speak to you concern about urology.

## 2016-06-13 NOTE — Telephone Encounter (Signed)
Spoke with pt to inform.  

## 2016-06-15 ENCOUNTER — Other Ambulatory Visit (INDEPENDENT_AMBULATORY_CARE_PROVIDER_SITE_OTHER): Payer: Medicare Other

## 2016-06-15 ENCOUNTER — Telehealth: Payer: Self-pay | Admitting: Emergency Medicine

## 2016-06-15 DIAGNOSIS — K921 Melena: Secondary | ICD-10-CM

## 2016-06-15 LAB — HEMOCCULT SLIDES (X 3 CARDS)
Fecal Occult Blood: NEGATIVE
OCCULT 1: NEGATIVE
OCCULT 2: NEGATIVE
OCCULT 3: NEGATIVE
OCCULT 4: NEGATIVE
OCCULT 5: NEGATIVE

## 2016-06-15 NOTE — Telephone Encounter (Signed)
Received calll from lab, hemacult card ordered.

## 2016-06-17 NOTE — Progress Notes (Signed)
Roger Ingram was seen today in the movement disorders clinic for neurologic consultation at the request of Binnie Rail, MD.  The consultation is for the evaluation of tremor.  He has seen a neurologist in the past regarding his tremor. Pt states that his neurologist was told that he had "a palsy of the 6th nerve."   Pt/family worried about the development of PD.  Tremor: Yes.     How long has it been going on? 20+ years per pt - Since the 1990s, Dr. Linna Darner started him on propranolol and it did help.   On 10 mg bid.  At rest or with activation?  Mostly with activation, and primarily noted with fine motor use  When is it noted the most?  Fine motor coordination  Fam hx of tremor?  No. (oldest sister may but she had addisons disease and he thought that may have contributed)  Located where?  Started in the right hand, now in the left hand as well  Affected by caffeine:  Yes.   (drinks 2-3 cups coffee/day)  Affected by alcohol:  May help it but doesn't drink much  Affected by stress:  No.  Affected by fatigue:  No.  Spills soup if on spoon:  Yes.    Spills glass of liquid if full:  Yes.    Affects ADL's (tying shoes, brushing teeth, etc):  No.   Other Specific Symptoms:  Voice: thinks that he is louder than he used to be b/c of hearing loss Sleep: doesn't sleep well and never has  Vivid Dreams:  May or may not  Acting out dreams:  No. Postural symptoms:  Yes.   (if gets up quick and then turns to the right, he notes that he will stagger)  Falls?  Yes.   (tripped over a tree root in the park) Bradykinesia symptoms: no bradykinesia noted Loss of smell:  No. Loss of taste:  No. Urinary Incontinence:  No. but gets up a lot in the middle of the night to use the bathroom Difficulty Swallowing:  No. Depression:  No. Memory changes:  minimal N/V:  No. Lightheaded:  No.  Syncope: No. Diplopia:  No. Dyskinesia:  No.  Neuroimaging has  previously been performed.  Only neuro imaging was  in 2004 and was an MRI of the brain and reported to show a left internal capsule lacune.  I was not able to pull those MRI images for review.  ALLERGIES:   Allergies  Allergen Reactions  . Hydrocodone     REACTION: diplopia  . Iohexol     Syncope post IV dye for renal calculi 1963    CURRENT MEDICATIONS:  Outpatient Encounter Prescriptions as of 06/20/2016  Medication Sig  . amLODipine-benazepril (LOTREL) 10-20 MG capsule Take 1 capsule by mouth daily.  Marland Kitchen aspirin EC 81 MG tablet Take 81 mg by mouth daily.  . finasteride (PROSCAR) 5 MG tablet Take 1 tablet (5 mg total) by mouth daily.  . folic acid (FOLVITE) A999333 MCG tablet Take 400 mcg by mouth daily.  . propranolol (INDERAL) 10 MG tablet TAKE ONE TABLET BY MOUTH TWICE DAILY FOR  ESSENTIAL  TREMOR  . tamsulosin (FLOMAX) 0.4 MG CAPS capsule TAKE ONE CAPSULE BY MOUTH ONCE DAILY   No facility-administered encounter medications on file as of 06/20/2016.     PAST MEDICAL HISTORY:   Past Medical History:  Diagnosis Date  . Basal cell cancer   . BPH (benign prostatic hypertrophy)  Dr Junious Silk  . Chronic kidney disease    Hx of clear fluid sacs on both kidneys. Drained about 2006  . Diverticulosis of colon   . Elevated PSA    [facility]  . History of kidney stones   . Hx of colonic polyps   . Hyperlipidemia   . Hypertension   . Melanoma ([place])     PAST SURGICAL HISTORY:   Past Surgical History:  Procedure Laterality Date  . basal cell     R  & L temple; Dr Allyson Sabal  . COLONOSCOPY W/ POLYPECTOMY  1997, 2011   Dr Earlean Shawl  . melanoma     L forehead  . Right rotator cuff sx    . TOTAL HIP ARTHROPLASTY Left 12/19/2014   Procedure: LEFT TOTAL HIP ARTHROPLASTY ANTERIOR APPROACH;  Surgeon: Mcarthur Rossetti, MD;  Location: [facility];  Service: Orthopedics;  Laterality: Left;    SOCIAL HISTORY:   Social History   Social History  . Marital status: Married    Spouse name: N/A  . Number of children: N/A  . Years of  education: N/A   Occupational History  . Not on file.   Social History Main Topics  . Smoking status: Never Smoker  . Smokeless tobacco: Never Used  . Alcohol use No  . Drug use: No  . Sexual activity: Not on file   Other Topics Concern  . Not on file   Social History Narrative   Walks twice daily for exercise    FAMILY HISTORY:   Family Status  Relation Status  . Brother Deceased  . Sister Deceased   unknown, multiple health problems  . Brother Alive   81  . Mother   . Father   . Paternal Grandfather   . Son     ROS:  A complete 10 system review of systems was obtained and was unremarkable apart from what is mentioned above.  PHYSICAL EXAMINATION:    VITALS:   Vitals:   06/20/16 0908  BP: (!) 146/70  Pulse: 69  Weight: 143 lb (64.9 kg)  Height: 5\' 8"  (1.727 m)    GEN:  The patient appears stated age and is in NAD. HEENT:  Normocephalic, atraumatic.  The mucous membranes are moist. The superficial temporal arteries are without ropiness or tenderness. CV:  RRR Lungs:  CTAB Neck/HEME:  There are no carotid bruits bilaterally.  Neurological examination:  Orientation: The patient is alert and oriented x3. Fund of knowledge is appropriate.  Recent and remote memory are intact.  Attention and concentration are normal.    Able to name objects and repeat phrases. Cranial nerves: There is good facial symmetry. Pupils are equal round and reactive to light bilaterally. Fundoscopic exam is attempted but the disc margins are not well visualized bilaterally. Extraocular muscles are intact. The visual fields are full to confrontational testing. The speech is fluent and clear. Soft palate rises symmetrically and there is no tongue deviation. Hearing is intact to conversational tone. Sensation: Sensation is intact to light and pinprick throughout (facial, trunk, extremities). Vibration is decreased distally. There is no extinction with double simultaneous stimulation. There is  no sensory dermatomal level identified. Motor: Strength is 5/5 in the bilateral upper and lower extremities.   Shoulder shrug is equal and symmetric.  There is no pronator drift. Deep tendon reflexes: Deep tendon reflexes are 2/4 at the bilateral biceps, triceps, brachioradialis, patella and trace at the bilateral achilles. Plantar responses are downgoing bilaterally.  Movement examination: Tone:  There is normal tone in the bilateral upper extremities.  The tone in the lower extremities is normal.  Abnormal movements: There is no significant rest tremor.  There is postural tremor that is mild to moderate.  he has difficulty with archimedes spirals, especially on the right and has trouble getting the pen to the paper.  he has difficulty when asked to pour a full glass of water from one glass to another. Coordination:  There is no decremation with RAM's, with any form of RAMS, including alternating supination and pronation of the forearm, hand opening and closing, finger taps, heel taps and toe taps. Gait and Station: The patient has no difficulty arising out of a deep-seated chair without the use of the hands. The patient's stride length is normal.  Can ambulate in a tandem fashion.  Able to heel toe walk.    Lab Results  Component Value Date   TSH 3.23 07/29/2015     Chemistry      Component Value Date/Time   NA 139 05/30/2016 1641   K 4.7 05/30/2016 1641   CL 103 05/30/2016 1641   CO2 28 05/30/2016 1641   BUN 28 (H) 05/30/2016 1641   CREATININE 1.28 05/30/2016 1641      Component Value Date/Time   CALCIUM 9.7 05/30/2016 1641   ALKPHOS 51 05/30/2016 1641   AST 17 05/30/2016 1641   ALT 16 05/30/2016 1641   BILITOT 0.4 05/30/2016 1641      ASSESSMENT/PLAN:  1.  Essential Tremor.  -This is evidenced by the symmetrical nature and longstanding hx of gradually getting worse.  We discussed nature and pathophysiology.  We discussed that this can continue to gradually get worse with time.   We discussed that some medications can worsen this, as can caffeine use.  We discussed medication therapy as well as surgical therapy.  Ultimately, the patient decided to continue the inderal 10 mg bid but not to increase that as his pulse is fairly low.  We decided to try and add low dose primidone 50 mg q hs.  Discussed first dose effect.  Risks, benefits, side effects and alternative therapies were discussed.  The opportunity to ask questions was given and they were answered to the best of my ability.  The patient expressed understanding and willingness to follow the outlined treatment protocols.  -reassured patient that I saw no evidence of PD. 2.  Follow up is anticipated in the next few months, sooner should new neurologic issues arise.  Much greater than 50% of this visit was spent in counseling and coordinating care.  Total face to face time:  60 min    Cc:  Binnie Rail, MD

## 2016-06-20 ENCOUNTER — Ambulatory Visit (INDEPENDENT_AMBULATORY_CARE_PROVIDER_SITE_OTHER): Payer: Medicare Other | Admitting: Neurology

## 2016-06-20 ENCOUNTER — Encounter: Payer: Self-pay | Admitting: Neurology

## 2016-06-20 VITALS — BP 146/70 | HR 69 | Ht 68.0 in | Wt 143.0 lb

## 2016-06-20 DIAGNOSIS — G250 Essential tremor: Secondary | ICD-10-CM

## 2016-06-20 MED ORDER — PRIMIDONE 50 MG PO TABS: 50.0000 mg | ORAL_TABLET | Freq: Every day | ORAL | 2 refills | Status: DC

## 2016-06-20 NOTE — Patient Instructions (Signed)
1. Stay on Propranolol  2. Start Primidone 50 mg tablets. Take 1/2 tablet for 4 nights, then increase to 1 tablet. Prescription has been sent to your pharmacy. The first dose of medication can cause some dizziness/nausea that should go away after the first dose.

## 2016-07-07 IMAGING — DX DG HIP (WITH OR WITHOUT PELVIS) 1V PORT*L*
2 series · 2 of 2 positions shown · non-contrast
Comparison: None.

CLINICAL DATA: Status post left hip replacement.

EXAM:
LEFT HIP (WITH PELVIS) 1 VIEW PORTABLE

[pelvis ap]
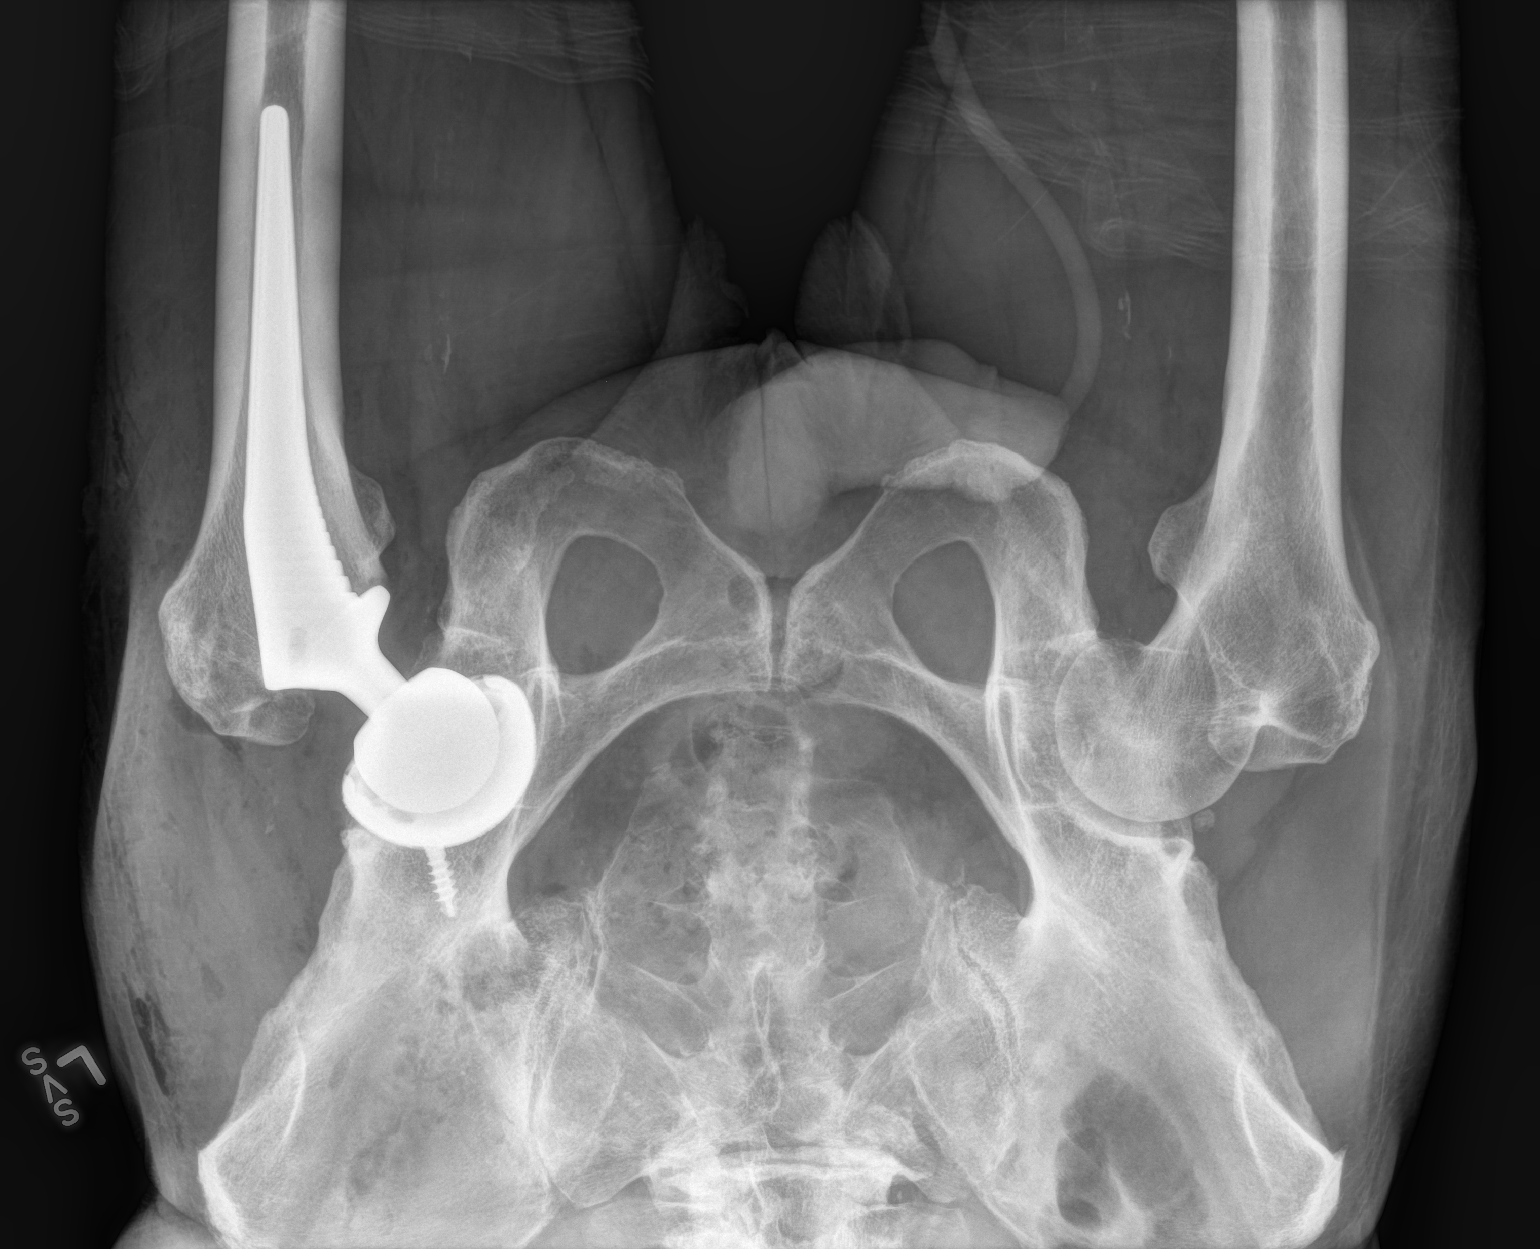

[hip x-table]
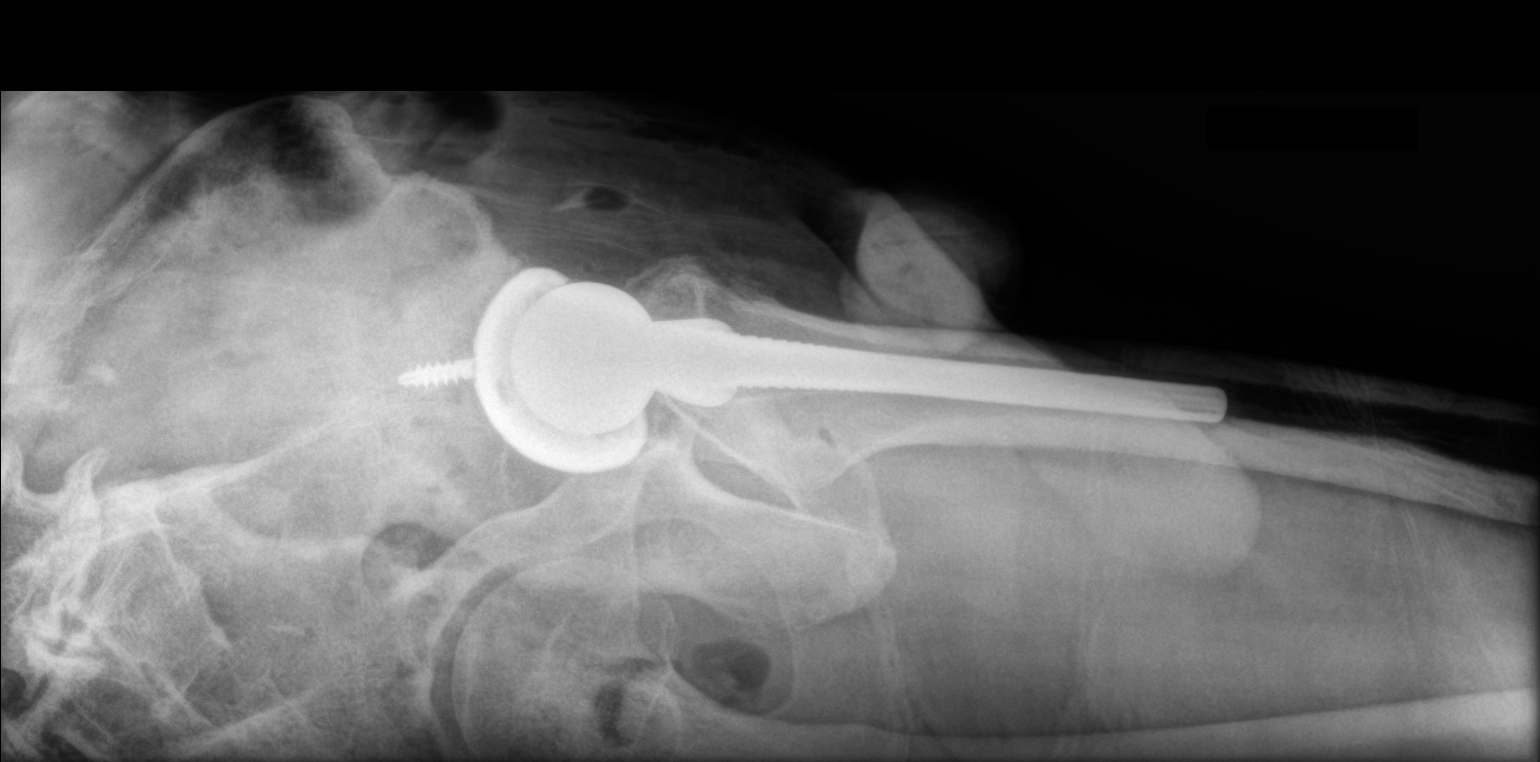

[2 of 2 positions shown; findings below may reference images not displayed]

FINDINGS: A left total hip prosthesis is demonstrated in satisfactory position
and alignment. No fracture or dislocation seen.
IMPRESSION: Satisfactory postoperative appearance of a left total hip
prosthesis.

## 2016-07-14 ENCOUNTER — Other Ambulatory Visit: Payer: Self-pay | Admitting: Internal Medicine

## 2016-07-29 ENCOUNTER — Ambulatory Visit (INDEPENDENT_AMBULATORY_CARE_PROVIDER_SITE_OTHER): Payer: Medicare Other | Admitting: Internal Medicine

## 2016-07-29 VITALS — BP 154/68 | HR 58 | Temp 98.3°F | Resp 16 | Ht 68.0 in | Wt 144.0 lb

## 2016-07-29 DIAGNOSIS — Z85820 Personal history of malignant melanoma of skin: Secondary | ICD-10-CM

## 2016-07-29 DIAGNOSIS — R7309 Other abnormal glucose: Secondary | ICD-10-CM

## 2016-07-29 DIAGNOSIS — Z23 Encounter for immunization: Secondary | ICD-10-CM | POA: Diagnosis not present

## 2016-07-29 DIAGNOSIS — I10 Essential (primary) hypertension: Secondary | ICD-10-CM

## 2016-07-29 DIAGNOSIS — Z0000 Encounter for general adult medical examination without abnormal findings: Secondary | ICD-10-CM

## 2016-07-29 DIAGNOSIS — Z125 Encounter for screening for malignant neoplasm of prostate: Secondary | ICD-10-CM

## 2016-07-29 DIAGNOSIS — R972 Elevated prostate specific antigen [PSA]: Secondary | ICD-10-CM

## 2016-07-29 DIAGNOSIS — E7800 Pure hypercholesterolemia, unspecified: Secondary | ICD-10-CM

## 2016-07-29 DIAGNOSIS — G250 Essential tremor: Secondary | ICD-10-CM | POA: Diagnosis not present

## 2016-07-29 MED ORDER — TAMSULOSIN HCL 0.4 MG PO CAPS: 0.4000 mg | ORAL_CAPSULE | Freq: Every day | ORAL | 3 refills | Status: DC

## 2016-07-29 MED ORDER — TAMSULOSIN HCL 0.4 MG PO CAPS: 0.4000 mg | ORAL_CAPSULE | Freq: Every day | ORAL | 0 refills | Status: DC

## 2016-07-29 NOTE — Patient Instructions (Addendum)
  Roger Ingram , Thank you for taking time to come for your Medicare Wellness Visit. I appreciate your ongoing commitment to your health goals. Please review the following plan we discussed and let me know if I can assist you in the future.   These are the goals we discussed: Goals    None      This is a list of the screening recommended for you and due dates:  Health Maintenance  Topic Date Due  . Tetanus Vaccine  12/02/1949  . Shingles Vaccine  12/03/1990  . Pneumonia vaccines (2 of 2 - PPSV23) today  . Flu Shot  11/13/2016*  *Topic was postponed. The date shown is not the original due date.     Test(s) ordered today. Your results will be released to [facility] (or called to you) after review, usually within 72hours after test completion. If any changes need to be made, you will be notified at that same time.  All other Health Maintenance issues reviewed.   All recommended immunizations and age-appropriate screenings are up-to-date or discussed.  Pneumovax administered today.   Medications reviewed and updated.   No changes recommended at this time.  Your prescription(s) have been submitted to your pharmacy. Please take as directed and contact our office if you believe you are having problem(s) with the medication(s).    Please followup in one year

## 2016-07-29 NOTE — Progress Notes (Signed)
Pre visit review using our clinic review tool, if applicable. No additional management support is needed unless otherwise documented below in the visit note. 

## 2016-07-29 NOTE — Progress Notes (Signed)
Subjective:    Patient ID: Roger Ingram, male    DOB: 14-Dec-1928, 82 y.o.   MRN: [id number]  HPI Here for medicare wellness exam and follow up of chronic medical problems.  Hypertension: He is taking his medication daily. He is compliant with a low sodium diet.  He denies chest pain, palpitations, edema, shortness of breath and regular headaches. He is exercising regularly.  He does monitor his blood pressure at home sometimes -- 135/70.    Essential tremor;  He takes propranolol and was started on primodone by Dr Tat.  he feels his tremors are controlled.  BPH:  He is taking all of his medications as prescribed.  He denies dysuria, hematuria and difficulty urinating. He is happy with her current medications and feels his BPH is controlled.    Hyperglycemia:  He eats a healthy diet and is exercising regularly.     I have personally reviewed and have noted 1.The patient's medical and social history 2.Their use of alcohol, tobacco or illicit drugs 3.Their current medications and supplements 4.The patient's functional ability including ADL's, fall risks, home safety risks             and hearing or visual impairment. 5.Diet and physical activities 6.Evidence for depression or mood disorders 7.Care team reviewed - neuro - Dr Tat, eye doctor, Derm - Dr Allyson Sabal annually   Are there smokers in your home (other than you)? No  Risk Factors Exercise:  Walks twice a day - walks fast every day Dietary issues discussed: well balanced  Cardiac risk factors: advanced age, hypertension, hyperlipidemia  Depression Screen  Have you felt down, depressed or hopeless? No  Have you felt little interest or pleasure in doing things?  No  Activities of Daily Living In your present state of health, do you have any difficulty performing the following activities?:  Driving? No Managing money?  No Feeding yourself? No Getting from bed  to chair? No Climbing a flight of stairs? No Preparing food and eating?: No Bathing or showering? No Getting dressed: No Getting to/using the toilet? No Moving around from place to place: No In the past year have you fallen or had a near fall?: yes, tripped in park - mild scrapes   Are you sexually active?  yes  Do you have more than one partner?  No  Hearing Difficulties: yes - wears hearing aids Do you often ask people to speak up or repeat themselves? yes Do you experience ringing or noises in your ears? yes Do you have difficulty understanding soft or whispered voices? yes Vision:              Any change in vision: mild changes, nothing major             Up to date with eye exam: yes Memory:  Do you feel that you have a problem with memory? No  Do you often misplace items? No  Do you feel safe at home?  Yes  Cognitive Testing  Alert, Orientated? Yes  Normal Appearance? Yes  Recall of three objects?  Yes  Can perform simple calculations? Yes  Displays appropriate judgment? Yes  Can read the correct time from a watch face? Yes   Advanced Directives have been discussed with the patient? Yes -    Medications and allergies reviewed with patient and updated if appropriate.  Patient Active Problem List   Diagnosis Date Noted  . LUQ pain 05/30/2016  . Osteoarthritis of left hip  12/19/2014  . Status post total replacement of left hip 12/19/2014  . Degenerative joint disease (DJD) of hip 11/13/2014  . Popliteal cyst, unruptured 06/17/2014  . DDD (degenerative disc disease), lumbosacral 10/26/2013  . NOCTURIA 12/28/2009  . Essential hypertension 07/06/2009  . FASTING HYPERGLYCEMIA 10/23/2008  . Hyperlipidemia 10/02/2008  . Essential tremor 10/02/2008  . Diverticulosis of large intestine 10/02/2008  . NEURITIS 10/02/2008  . MALIGNANT MELANOMA, HX OF 10/02/2008  . SKIN CANCER, HX OF 10/02/2008  . History of colonic polyps 10/02/2008  . ELEVATED PROSTATE SPECIFIC ANTIGEN  09/08/2008  . HYPERPLASIA PROSTATE UNS W/UR OBST & OTH LUTS 06/02/2008    Current Outpatient Prescriptions on File Prior to Visit  Medication Sig Dispense Refill  . amLODipine-benazepril (LOTREL) 10-20 MG capsule Take 1 capsule by mouth daily. 90 capsule 3  . aspirin EC 81 MG tablet Take 81 mg by mouth daily.    . finasteride (PROSCAR) 5 MG tablet Take 1 tablet (5 mg total) by mouth daily. 90 tablet 1  . folic acid (FOLVITE) A999333 MCG tablet Take 400 mcg by mouth daily.    . primidone (MYSOLINE) 50 MG tablet Take 1 tablet (50 mg total) by mouth at bedtime. 30 tablet 2  . propranolol (INDERAL) 10 MG tablet TAKE ONE TABLET BY MOUTH TWICE DAILY FOR  ESSENTIAL  TREMOR 180 tablet 3   No current facility-administered medications on file prior to visit.     Past Medical History:  Diagnosis Date  . Basal cell cancer   . BPH (benign prostatic hypertrophy)    Dr Junious Silk  . Chronic kidney disease    Hx of clear fluid sacs on both kidneys. Drained about 2006  . Diverticulosis of colon   . Elevated PSA    [facility]  . History of kidney stones   . Hx of colonic polyps   . Hyperlipidemia   . Hypertension   . Melanoma [facility])     Past Surgical History:  Procedure Laterality Date  . basal cell     R  & L temple; Dr Allyson Sabal  . COLONOSCOPY W/ POLYPECTOMY  1997, 2011   Dr Earlean Shawl  . melanoma     L forehead  . Right rotator cuff sx    . TOTAL HIP ARTHROPLASTY Left 12/19/2014   Procedure: LEFT TOTAL HIP ARTHROPLASTY ANTERIOR APPROACH;  Surgeon: Mcarthur Rossetti, MD;  Location: [facility];  Service: Orthopedics;  Laterality: Left;    Social History   Social History  . Marital status: Married    Spouse name: N/A  . Number of children: N/A  . Years of education: N/A   Occupational History  . retired     Insurance underwriter   Social History Main Topics  . Smoking status: Never Smoker  . Smokeless tobacco: Never Used  . Alcohol use No  . Drug use: No  . Sexual activity: Not on file   Other  Topics Concern  . Not on file   Social History Narrative   Walks twice daily for exercise    Family History  Problem Relation Age of Onset  . Colon cancer Brother 46  . Other Sister     addisons disease  . Dementia Sister   . Stroke Brother   . Stroke Mother 64  . Heart attack Father 38  . Stroke Father   . Stroke Paternal Grandfather     in 34s  . Diabetes Son     IDDM  . Drug abuse Son  Review of Systems  Constitutional: Negative for appetite change, chills and fever.  HENT: Positive for hearing loss and tinnitus.   Eyes: Negative for visual disturbance.  Respiratory: Negative for cough, shortness of breath and wheezing.   Cardiovascular: Negative for chest pain, palpitations and leg swelling.  Gastrointestinal: Negative for abdominal pain, blood in stool, constipation, diarrhea and nausea.       Occ gerd  Genitourinary: Negative for difficulty urinating, dysuria and hematuria.  Musculoskeletal: Negative for arthralgias and back pain.       Morning stiffness - lasts < 1 hr  Skin: Negative for color change and rash.  Neurological: Negative for light-headedness and headaches.  Psychiatric/Behavioral: Negative for dysphoric mood. The patient is not nervous/anxious.        Objective:   Vitals:   07/29/16 1408  BP: (!) 154/68  Pulse: (!) 58  Resp: 16  Temp: 98.3 F (36.8 C)   Filed Weights   07/29/16 1408  Weight: 144 lb (65.3 kg)   Body mass index is 21.9 kg/m.   Physical Exam Constitutional: He appears well-developed and well-nourished. No distress.  HENT:  Head: Normocephalic and atraumatic.  Right Ear: External ear normal.  Left Ear: External ear normal.  Mouth/Throat: Oropharynx is clear and moist.  Normal ear canals and TM b/l  Eyes: Conjunctivae and EOM are normal.  Neck: Neck supple. No tracheal deviation present. No thyromegaly present.  No carotid bruit  Cardiovascular: Normal rate, regular rhythm, normal heart sounds and intact distal  pulses.   No murmur heard. Pulmonary/Chest: Effort normal and breath sounds normal. No respiratory distress. He has no wheezes. He has no rales.  Abdominal: Soft. Bowel sounds are normal. He exhibits no distension. There is no tenderness.  Genitourinary: deferred  Musculoskeletal: He exhibits no edema.  Lymphadenopathy:    He has no cervical adenopathy.  Skin: Skin is warm and dry. He is not diaphoretic.  Psychiatric: He has a normal mood and affect. His behavior is normal.        Assessment & Plan:   Wellness Exam: Immunizations   pneumovax today Colonoscopy - no longer needed at his age Eye exam  Up to date  Hearing loss  Yes, wears hearing aids Memory concerns/difficulties  none Independent of ADLs  fully Stressed the importance of regular exercise, which he is doing   Patient received copy of preventative screening tests/immunizations recommended for the next 5-10 years.   See Problem List for Assessment and Plan of chronic medical problems.    F/u annually

## 2016-07-31 ENCOUNTER — Encounter: Payer: Self-pay | Admitting: Internal Medicine

## 2016-07-31 NOTE — Assessment & Plan Note (Signed)
Monitors BP at home - well controlled Continue current medication at current dose Continue regular exercise and healthy diet

## 2016-07-31 NOTE — Assessment & Plan Note (Addendum)
Has seen Dr Tat Taking propranolol and primidone controlled

## 2016-07-31 NOTE — Assessment & Plan Note (Signed)
Sees Dr Allyson Sabal annually

## 2016-07-31 NOTE — Assessment & Plan Note (Signed)
Lipids controlled with lifestyle Check lipid panel

## 2016-07-31 NOTE — Assessment & Plan Note (Signed)
No longer following with urology Will check psa per patient request

## 2016-07-31 NOTE — Assessment & Plan Note (Signed)
Check a1c Eats healthy, exercising regularly

## 2016-08-01 ENCOUNTER — Other Ambulatory Visit (INDEPENDENT_AMBULATORY_CARE_PROVIDER_SITE_OTHER): Payer: Medicare Other

## 2016-08-01 DIAGNOSIS — I10 Essential (primary) hypertension: Secondary | ICD-10-CM

## 2016-08-01 DIAGNOSIS — E7800 Pure hypercholesterolemia, unspecified: Secondary | ICD-10-CM

## 2016-08-01 DIAGNOSIS — R7309 Other abnormal glucose: Secondary | ICD-10-CM

## 2016-08-01 DIAGNOSIS — Z125 Encounter for screening for malignant neoplasm of prostate: Secondary | ICD-10-CM

## 2016-08-01 DIAGNOSIS — G250 Essential tremor: Secondary | ICD-10-CM | POA: Diagnosis not present

## 2016-08-01 LAB — CBC WITH DIFFERENTIAL/PLATELET
Basophils Absolute: 0 10*3/uL (ref 0.0–0.1)
Basophils Relative: 0.5 % (ref 0.0–3.0)
Eosinophils Absolute: 0.2 10*3/uL (ref 0.0–0.7)
Eosinophils Relative: 2 % (ref 0.0–5.0)
HCT: 41 % (ref 39.0–52.0)
Hemoglobin: 13.9 g/dL (ref 13.0–17.0)
Lymphocytes Relative: 32.6 % (ref 12.0–46.0)
Lymphs Abs: 2.6 10*3/uL (ref 0.7–4.0)
MCHC: 33.7 g/dL (ref 30.0–36.0)
MCV: 89.8 fl (ref 78.0–100.0)
Monocytes Absolute: 0.9 10*3/uL (ref 0.1–1.0)
Monocytes Relative: 10.8 % (ref 3.0–12.0)
Neutro Abs: 4.3 10*3/uL (ref 1.4–7.7)
Neutrophils Relative %: 54.1 % (ref 43.0–77.0)
Platelets: 241 10*3/uL (ref 150.0–400.0)
RBC: 4.57 Mil/uL (ref 4.22–5.81)
RDW: 12.9 % (ref 11.5–15.5)
WBC: 8 10*3/uL (ref 4.0–10.5)

## 2016-08-01 LAB — HEMOGLOBIN A1C: HEMOGLOBIN A1C: 5.7 % (ref 4.6–6.5)

## 2016-08-01 LAB — LIPID PANEL
CHOL/HDL RATIO: 2
Cholesterol: 200 mg/dL (ref 0–200)
HDL: 82.6 mg/dL (ref >39.00)
LDL Cholesterol: 108 mg/dL — ABNORMAL HIGH (ref 0–99)
NONHDL: 117.48
Triglycerides: 45 mg/dL (ref 0.0–149.0)
VLDL: 9 mg/dL (ref 0.0–40.0)

## 2016-08-01 LAB — COMPREHENSIVE METABOLIC PANEL
ALT: 14 U/L (ref 0–53)
AST: 14 U/L (ref 0–37)
Albumin: 4.1 g/dL (ref 3.5–5.2)
Alkaline Phosphatase: 60 U/L (ref 39–117)
BILIRUBIN TOTAL: 0.5 mg/dL (ref 0.2–1.2)
BUN: 25 mg/dL — AB (ref 6–23)
CO2: 29 meq/L (ref 19–32)
CREATININE: 1.16 mg/dL (ref 0.40–1.50)
Calcium: 9.4 mg/dL (ref 8.4–10.5)
Chloride: 102 mEq/L (ref 96–112)
GFR: 63.5 mL/min (ref >60.00)
GLUCOSE: 107 mg/dL — AB (ref 70–99)
Potassium: 5.5 mEq/L — ABNORMAL HIGH (ref 3.5–5.1)
Sodium: 139 mEq/L (ref 135–145)
Total Protein: 6.4 g/dL (ref 6.0–8.3)

## 2016-08-01 LAB — TSH: TSH: 4.47 u[IU]/mL (ref 0.35–4.50)

## 2016-08-01 LAB — PSA, MEDICARE: PSA: 1.35 ng/ml (ref 0.10–4.00)

## 2016-08-04 ENCOUNTER — Encounter: Payer: Self-pay | Admitting: Emergency Medicine

## 2016-09-19 NOTE — Progress Notes (Signed)
Roger Ingram was seen today in the movement disorders clinic for neurologic consultation at the request of Binnie Rail, MD.  The consultation is for the evaluation of tremor.  He has seen a neurologist in the past regarding his tremor. Pt states that his neurologist was told that he had "a palsy of the 6th nerve."   Pt/family worried about the development of PD.  09/20/16 update:  Patient follows up today for his essential tremor.  He is on very low-dose Inderal 10 mg daily.  Last visit, I started him on primidone, 50 mg daily.  He states that it is helping.  No SE with the medication.  ALLERGIES:   Allergies  Allergen Reactions  . Hydrocodone     REACTION: diplopia  . Iohexol     Syncope post IV dye for renal calculi 1963    CURRENT MEDICATIONS:  Outpatient Encounter Prescriptions as of 09/20/2016  Medication Sig  . amLODipine-benazepril (LOTREL) 10-20 MG capsule Take 1 capsule by mouth daily.  Marland Kitchen aspirin EC 81 MG tablet Take 81 mg by mouth daily.  . finasteride (PROSCAR) 5 MG tablet Take 1 tablet (5 mg total) by mouth daily.  . folic acid (FOLVITE) A999333 MCG tablet Take 400 mcg by mouth daily.  . primidone (MYSOLINE) 50 MG tablet Take 1 tablet (50 mg total) by mouth at bedtime.  . propranolol (INDERAL) 10 MG tablet TAKE ONE TABLET BY MOUTH TWICE DAILY FOR  ESSENTIAL  TREMOR  . tamsulosin (FLOMAX) 0.4 MG CAPS capsule Take 1 capsule (0.4 mg total) by mouth daily.   No facility-administered encounter medications on file as of 09/20/2016.     PAST MEDICAL HISTORY:   Past Medical History:  Diagnosis Date  . Basal cell cancer   . BPH (benign prostatic hypertrophy)    Dr Junious Silk  . Chronic kidney disease    Hx of clear fluid sacs on both kidneys. Drained about 2006  . Diverticulosis of colon   . Elevated PSA    [facility]  . History of kidney stones   . Hx of colonic polyps   . Hyperlipidemia   . Hypertension   . Melanoma ([place])     PAST SURGICAL HISTORY:   Past  Surgical History:  Procedure Laterality Date  . basal cell     R  & L temple; Dr Allyson Sabal  . COLONOSCOPY W/ POLYPECTOMY  1997, 2011   Dr Earlean Shawl  . melanoma     L forehead  . Right rotator cuff sx    . TOTAL HIP ARTHROPLASTY Left 12/19/2014   Procedure: LEFT TOTAL HIP ARTHROPLASTY ANTERIOR APPROACH;  Surgeon: Mcarthur Rossetti, MD;  Location: [facility];  Service: Orthopedics;  Laterality: Left;    SOCIAL HISTORY:   Social History   Social History  . Marital status: Married    Spouse name: N/A  . Number of children: N/A  . Years of education: N/A   Occupational History  . retired     Insurance underwriter   Social History Main Topics  . Smoking status: Never Smoker  . Smokeless tobacco: Never Used  . Alcohol use No  . Drug use: No  . Sexual activity: Not on file   Other Topics Concern  . Not on file   Social History Narrative   Walks twice daily for exercise    FAMILY HISTORY:   Family Status  Relation Status  . Brother Deceased  . Sister Deceased   unknown, multiple health problems  .  Brother Alive   87  . Mother Deceased  . Father Deceased  . Paternal Grandfather   . Son Alive  . Daughter Alive  . Son Deceased    ROS:  A complete 10 system review of systems was obtained and was unremarkable apart from what is mentioned above.  PHYSICAL EXAMINATION:    VITALS:   Vitals:   09/20/16 1516  BP: 110/70  Pulse: 66  Weight: 147 lb (66.7 kg)  Height: 5\' 8"  (1.727 m)    GEN:  The patient appears stated age and is in NAD. HEENT:  Normocephalic, atraumatic.  The mucous membranes are moist. The superficial temporal arteries are without ropiness or tenderness. CV:  RRR Lungs:  CTAB Neck/HEME:  There are no carotid bruits bilaterally.  Neurological examination:  Orientation: The patient is alert and oriented x3.  Cranial nerves: There is good facial symmetry. . Extraocular muscles are intact.  The speech is fluent and clear. Soft palate rises symmetrically and there is no  tongue deviation. Hearing is intact to conversational tone. Sensation: Sensation is intact to light and pinprick throughout (facial, trunk, extremities). Vibration is decreased distally. There is no extinction with double simultaneous stimulation. There is no sensory dermatomal level identified. Motor: Strength is 5/5 in the bilateral upper and lower extremities.   Shoulder shrug is equal and symmetric.  There is no pronator drift. Deep tendon reflexes: Deep tendon reflexes are 2/4 at the bilateral biceps, triceps, brachioradialis, patella and trace at the bilateral achilles. Plantar responses are downgoing bilaterally.  Movement examination: Tone: There is normal tone in the bilateral upper extremities.  The tone in the lower extremities is normal.  Abnormal movements: There is no significant rest tremor.  There is postural tremor that is mild to moderate.  he has difficulty with archimedes spirals, but no longer has trouble getting pen to paper. Coordination:  There is no decremation with RAM's, with any form of RAMS, including alternating supination and pronation of the forearm, hand opening and closing, finger taps, heel taps and toe taps. Gait and Station: The patient has no difficulty arising out of a deep-seated chair without the use of the hands. The patient's stride length is normal.   Lab Results  Component Value Date   TSH 4.47 08/01/2016     Chemistry      Component Value Date/Time   NA 139 08/01/2016 0733   K 5.5 (H) 08/01/2016 0733   CL 102 08/01/2016 0733   CO2 29 08/01/2016 0733   BUN 25 (H) 08/01/2016 0733   CREATININE 1.16 08/01/2016 0733      Component Value Date/Time   CALCIUM 9.4 08/01/2016 0733   ALKPHOS 60 08/01/2016 0733   AST 14 08/01/2016 0733   ALT 14 08/01/2016 0733   BILITOT 0.5 08/01/2016 0733      ASSESSMENT/PLAN:  1.  Essential Tremor.  -This is evidenced by the symmetrical nature and longstanding hx of gradually getting worse.  We discussed nature  and pathophysiology.  We discussed that this can continue to gradually get worse with time.  We discussed that some medications can worsen this, as can caffeine use.  We discussed medication therapy as well as surgical therapy.  Ultimately, the patient decided to continue the inderal 10 mg bid but not to increase that as his pulse is fairly low.    -Could probably benefit from an increase in primidone, but the patient states that he is happy with his current degree of tremor control.  We'll  continue primidone, 50 mg daily.  Refill that today.  -reassured patient that I saw no evidence of PD. 2.  Follow up is anticipated in the next 6 months, sooner should new neurologic issues arise.    Cc:  Binnie Rail, MD

## 2016-09-20 ENCOUNTER — Ambulatory Visit (INDEPENDENT_AMBULATORY_CARE_PROVIDER_SITE_OTHER): Payer: Medicare Other | Admitting: Neurology

## 2016-09-20 ENCOUNTER — Encounter: Payer: Self-pay | Admitting: Neurology

## 2016-09-20 VITALS — BP 110/70 | HR 66 | Ht 68.0 in | Wt 147.0 lb

## 2016-09-20 DIAGNOSIS — G250 Essential tremor: Secondary | ICD-10-CM | POA: Diagnosis not present

## 2016-09-20 MED ORDER — PRIMIDONE 50 MG PO TABS: 50.0000 mg | ORAL_TABLET | Freq: Every day | ORAL | 1 refills | Status: DC

## 2016-10-10 ENCOUNTER — Other Ambulatory Visit: Payer: Self-pay | Admitting: Internal Medicine

## 2016-10-20 DIAGNOSIS — L570 Actinic keratosis: Secondary | ICD-10-CM | POA: Diagnosis not present

## 2016-10-20 DIAGNOSIS — L03012 Cellulitis of left finger: Secondary | ICD-10-CM | POA: Diagnosis not present

## 2016-10-20 DIAGNOSIS — D235 Other benign neoplasm of skin of trunk: Secondary | ICD-10-CM | POA: Diagnosis not present

## 2016-10-20 DIAGNOSIS — L814 Other melanin hyperpigmentation: Secondary | ICD-10-CM | POA: Diagnosis not present

## 2016-10-20 DIAGNOSIS — L820 Inflamed seborrheic keratosis: Secondary | ICD-10-CM | POA: Diagnosis not present

## 2016-10-20 DIAGNOSIS — Z85820 Personal history of malignant melanoma of skin: Secondary | ICD-10-CM | POA: Diagnosis not present

## 2016-11-02 ENCOUNTER — Other Ambulatory Visit: Payer: Self-pay | Admitting: Internal Medicine

## 2017-02-21 DIAGNOSIS — H2513 Age-related nuclear cataract, bilateral: Secondary | ICD-10-CM | POA: Diagnosis not present

## 2017-02-21 DIAGNOSIS — H52203 Unspecified astigmatism, bilateral: Secondary | ICD-10-CM | POA: Diagnosis not present

## 2017-02-21 DIAGNOSIS — H35373 Puckering of macula, bilateral: Secondary | ICD-10-CM | POA: Diagnosis not present

## 2017-03-24 NOTE — Progress Notes (Signed)
Roger Ingram was seen today in the movement disorders clinic for neurologic consultation at the request of Burns, Claudina Lick, MD.  The consultation is for the evaluation of tremor.  He has seen a neurologist in the past regarding his tremor. Pt states that his neurologist was told that he had "a palsy of the 6th nerve."   Pt/family worried about the development of PD.  09/20/16 update:  Patient follows up today for his essential tremor.  He is on very low-dose Inderal 10 mg daily.  Last visit, I started him on primidone, 50 mg daily.  He states that it is helping.  No SE with the medication.  03/27/17 update:  Patient seen today in follow-up for essential tremor.  He is on Inderal, 10 mg twice per day and primidone 50 mg daily.  The patient reports that he has been doing fairly well.  No change in tremor.  No side effects with his medication.  ALLERGIES:   Allergies  Allergen Reactions  . Hydrocodone     REACTION: diplopia  . Iohexol     Syncope post IV dye for renal calculi 1963    CURRENT MEDICATIONS:  Outpatient Encounter Prescriptions as of 03/27/2017  Medication Sig  . amLODipine-benazepril (LOTREL) 10-20 MG capsule TAKE ONE CAPSULE BY MOUTH ONCE DAILY  . aspirin EC 81 MG tablet Take 81 mg by mouth daily.  . finasteride (PROSCAR) 5 MG tablet Take 1 tablet (5 mg total) by mouth daily.  . folic acid (FOLVITE) 170 MCG tablet Take 400 mcg by mouth daily.  . primidone (MYSOLINE) 50 MG tablet Take 1 tablet (50 mg total) by mouth at bedtime.  . propranolol (INDERAL) 10 MG tablet TAKE ONE TABLET BY MOUTH TWICE DAILY FOR ESSENTIAL TREMOR  . tamsulosin (FLOMAX) 0.4 MG CAPS capsule Take 1 capsule (0.4 mg total) by mouth daily.   No facility-administered encounter medications on file as of 03/27/2017.     PAST MEDICAL HISTORY:   Past Medical History:  Diagnosis Date  . Basal cell cancer   . BPH (benign prostatic hypertrophy)    Dr Junious Silk  . Chronic kidney disease    Hx of clear fluid  sacs on both kidneys. Drained about 2006  . Diverticulosis of colon   . Elevated PSA    [facility]  . History of kidney stones   . Hx of colonic polyps   . Hyperlipidemia   . Hypertension   . Melanoma ([place])     PAST SURGICAL HISTORY:   Past Surgical History:  Procedure Laterality Date  . basal cell     R  & L temple; Dr Allyson Sabal  . COLONOSCOPY W/ POLYPECTOMY  1997, 2011   Dr Earlean Shawl  . melanoma     L forehead  . Right rotator cuff sx    . TOTAL HIP ARTHROPLASTY Left 12/19/2014   Procedure: LEFT TOTAL HIP ARTHROPLASTY ANTERIOR APPROACH;  Surgeon: Mcarthur Rossetti, MD;  Location: [facility];  Service: Orthopedics;  Laterality: Left;    SOCIAL HISTORY:   Social History   Social History  . Marital status: Married    Spouse name: N/A  . Number of children: N/A  . Years of education: N/A   Occupational History  . retired     Insurance underwriter   Social History Main Topics  . Smoking status: Never Smoker  . Smokeless tobacco: Never Used  . Alcohol use No  . Drug use: No  . Sexual activity: Not on file  Other Topics Concern  . Not on file   Social History Narrative   Walks twice daily for exercise    FAMILY HISTORY:   Family Status  Relation Status  . Brother Deceased  . Sister Deceased       unknown, multiple health problems  . Brother Alive       75  . Mother Deceased  . Father Deceased  . PGF (Not Specified)  . Son Alive  . Daughter Alive  . Son Deceased    ROS:  A complete 10 system review of systems was obtained and was unremarkable apart from what is mentioned above.  PHYSICAL EXAMINATION:    VITALS:   Vitals:   03/27/17 1359  BP: 100/66  Pulse: (!) 56  SpO2: 97%  Weight: 144 lb (65.3 kg)  Height: 5\' 8"  (1.727 m)    GEN:  The patient appears stated age and is in NAD. HEENT:  Normocephalic, atraumatic.  The mucous membranes are moist. The superficial temporal arteries are without ropiness or tenderness. CV:  Bradycardic.  regular Lungs:   CTAB Neck/HEME:  There are no carotid bruits bilaterally.  Neurological examination:  Orientation: The patient is alert and oriented x3.  Cranial nerves: There is good facial symmetry. . Extraocular muscles are intact.  The speech is fluent and clear. Soft palate rises symmetrically and there is no tongue deviation. Hearing is intact to conversational tone. Sensation: Sensation is intact to light touch x 4. Motor: Strength is at least antigravity x 4.   Movement examination: Tone: There is normal tone in the bilateral upper extremities.  The tone in the lower extremities is normal.  Abnormal movements: There is no significant rest tremor.  There is postural tremor.  There is intention tremor.  He has tremor with pouring water from one glass to another and spills a bit of the water.   Coordination:  There is no decremation with any form of RAMS, including alternating supination and pronation of the forearm, hand opening and closing, finger taps, heel taps and toe taps. Gait and Station: The patient has no difficulty arising out of a deep-seated chair without the use of the hands. The patient's stride length is normal.   Lab Results  Component Value Date   TSH 4.47 08/01/2016     Chemistry      Component Value Date/Time   NA 139 08/01/2016 0733   K 5.5 (H) 08/01/2016 0733   CL 102 08/01/2016 0733   CO2 29 08/01/2016 0733   BUN 25 (H) 08/01/2016 0733   CREATININE 1.16 08/01/2016 0733      Component Value Date/Time   CALCIUM 9.4 08/01/2016 0733   ALKPHOS 60 08/01/2016 0733   AST 14 08/01/2016 0733   ALT 14 08/01/2016 0733   BILITOT 0.5 08/01/2016 0733      ASSESSMENT/PLAN:  1.  Essential Tremor.  -This is evidenced by the symmetrical nature and longstanding hx of gradually getting worse.  We discussed nature and pathophysiology.  We discussed that this can continue to gradually get worse with time.  We discussed that some medications can worsen this, as can caffeine use.  We  discussed medication therapy as well as surgical therapy.    -wants to continue inderal 10 mg bid.  Risks, benefits, side effects and alternative therapies were discussed.  The opportunity to ask questions was given and they were answered to the best of my ability.  The patient expressed understanding and willingness to follow the outlined treatment  protocols.  -Could probably benefit from an increase in primidone, but the patient states that he is happy with his current degree of tremor control.    -reassured patient that I saw no evidence of PD. 2.  Follow up is anticipated in the next 6 months, sooner should new neurologic issues arise.  Much greater than 50% of this visit was spent in counseling and coordinating care.  Total face to face time:  20 min     Cc:  Binnie Rail, MD

## 2017-03-25 ENCOUNTER — Other Ambulatory Visit: Payer: Self-pay | Admitting: Neurology

## 2017-03-27 ENCOUNTER — Ambulatory Visit (INDEPENDENT_AMBULATORY_CARE_PROVIDER_SITE_OTHER): Payer: Medicare Other | Admitting: Neurology

## 2017-03-27 ENCOUNTER — Encounter: Payer: Self-pay | Admitting: Neurology

## 2017-03-27 ENCOUNTER — Other Ambulatory Visit: Payer: Self-pay | Admitting: Internal Medicine

## 2017-03-27 VITALS — BP 100/70 | HR 56 | Ht 68.0 in | Wt 144.0 lb

## 2017-03-27 DIAGNOSIS — G250 Essential tremor: Secondary | ICD-10-CM

## 2017-03-27 MED ORDER — PRIMIDONE 50 MG PO TABS: 50.0000 mg | ORAL_TABLET | Freq: Every day | ORAL | 1 refills | Status: DC

## 2017-03-27 NOTE — Patient Instructions (Signed)
It was great to see you!  I will see you in about 6 months!

## 2017-04-18 ENCOUNTER — Telehealth: Payer: Self-pay | Admitting: Internal Medicine

## 2017-04-18 MED ORDER — PROPRANOLOL HCL 10 MG PO TABS: ORAL_TABLET | ORAL | 0 refills | Status: DC

## 2017-04-18 NOTE — Telephone Encounter (Signed)
Reviewed chart pt is up-to-date sent refills to [facility].../lmb  

## 2017-04-18 NOTE — Telephone Encounter (Signed)
Pt called in and needs refill on his propranolol (INDERAL) 10 MG tablet [[id number]]      [facility] on file

## 2017-04-24 DIAGNOSIS — R1084 Generalized abdominal pain: Secondary | ICD-10-CM | POA: Diagnosis not present

## 2017-06-12 DIAGNOSIS — R351 Nocturia: Secondary | ICD-10-CM | POA: Diagnosis not present

## 2017-06-12 DIAGNOSIS — N281 Cyst of kidney, acquired: Secondary | ICD-10-CM | POA: Diagnosis not present

## 2017-06-12 DIAGNOSIS — N401 Enlarged prostate with lower urinary tract symptoms: Secondary | ICD-10-CM | POA: Diagnosis not present

## 2017-06-12 DIAGNOSIS — N402 Nodular prostate without lower urinary tract symptoms: Secondary | ICD-10-CM | POA: Diagnosis not present

## 2017-07-31 NOTE — Patient Instructions (Addendum)
  [name] , Thank you for taking time to come for your Medicare Wellness Visit. I appreciate your ongoing commitment to your health goals. Please review the following plan we discussed and let me know if I can assist you in the future.   These are the goals we discussed: Goals    None      This is a list of the screening recommended for you and due dates:  Health Maintenance  Topic Date Due  . Tetanus Vaccine  [birth year]02/1947  . Flu Shot  Completed  . Pneumonia vaccines  Completed      Test(s) ordered today. Your results will be released to [place] (or called to you) after review, usually within 72hours after test completion. If any changes need to be made, you will be notified at that same time.  All other Health Maintenance issues reviewed.   All recommended immunizations and age-appropriate screenings are up-to-date or discussed.  Flu immunization administered today.   Medications reviewed and updated.   No changes recommended at this time.   Please followup in one year

## 2017-07-31 NOTE — Progress Notes (Signed)
Subjective:    Patient ID: Roger Ingram, male    DOB: 03/13/1929, 83 y.o.   MRN: [id number]  HPI Here for medicare wellness exam and for follow up of his chronic medical problems.   I have personally reviewed and have noted 1.The patient's medical and social history 2.Their use of alcohol, tobacco or illicit drugs 3.Their current medications and supplements 4.The patient's functional ability including ADL's, fall risks, home                 safety risk and hearing or visual impairment. 5.Diet and physical activities 6.Evidence for depression or mood disorders 7.Care team reviewed  - Der - Dr Allyson Sabal, Neurology - Dr Carles Collet, Eye    Essential tremor:  He sees Dr Tat and is taking primodone.  His tremors are worse with too much coffee.  Overall, his tremors are controlled.    Hypertension: He is taking his medication daily. He is compliant with a low sodium diet.  He is exercising regularly.      BPH:  He is taking both medications daily.  He denies difficulty urinating.    Prediabetes:  He is compliant with a low sugar/carbohydrate diet.  He is exercising regularly.  IBS:  He has had IBS for years.    Are there smokers in your home (other than you)? No  Risk Factors Exercise: walking twice a day, active around the house Dietary issues discussed:   Eating less - decreased appetite, eating well balanced - fruits and veges  Vitamin and supplement use:  Folic acid only   Opiod use:  none Side effects from medication:    N/a  Does medications benefits outweigh risks/side effects:   n/a  Cardiac risk factors: advanced age, hypertension, hyperlipidemia ( diet controlled)  Depression Screen  Have you felt down, depressed or hopeless? No  Have you felt little interest or pleasure in doing things?  No  Activities of Daily Living In your present state of health, do you have any difficulty performing the following  activities?:  Driving? No Managing money?  No Feeding yourself? No Getting from bed to chair? No Climbing a flight of stairs? No Preparing food and eating?: No Bathing or showering? No Getting dressed: No Getting to/using the toilet? No Moving around from place to place: No In the past year have you fallen or had a near fall?: No   Do you have more than one partner?  no  Hearing Difficulties: yes - wears hearing aids Do you often ask people to speak up or repeat themselves? yes Do you experience ringing or noises in your ears? yes Do you have difficulty understanding soft or whispered voices? yes Vision:              Any change in vision:  no             Up to date with eye exam:   yes  Memory:  Do you feel that you have a problem with memory? Yes - name recall, day to day things - short term memory  Do you often misplace items? No  Do you feel safe at home?  Yes  Cognitive Testing  Alert, Orientated? Yes  Normal Appearance? Yes  Recall of three objects?  Yes  Can perform simple calculations? Yes  Displays appropriate judgment? Yes  Can read the correct time from a watch face? Yes   Advanced Directives have been discussed with the patient? Yes - in place  Medications and allergies reviewed with patient and updated if appropriate.  Patient Active Problem List   Diagnosis Date Noted  . LUQ pain 05/30/2016  . Osteoarthritis of left hip 12/19/2014  . Status post total replacement of left hip 12/19/2014  . Degenerative joint disease (DJD) of hip 11/13/2014  . Popliteal cyst, unruptured 06/17/2014  . DDD (degenerative disc disease), lumbosacral 10/26/2013  . NOCTURIA 12/28/2009  . Essential hypertension 07/06/2009  . Prediabetes 10/23/2008  . Hyperlipidemia 10/02/2008  . Essential tremor 10/02/2008  . Diverticulosis of large intestine 10/02/2008  . NEURITIS 10/02/2008  . MALIGNANT MELANOMA, HX OF 10/02/2008  . SKIN CANCER, HX OF 10/02/2008  . History of colonic  polyps 10/02/2008  . ELEVATED PROSTATE SPECIFIC ANTIGEN 09/08/2008  . BPH (benign prostatic hyperplasia) 06/02/2008    Current Outpatient Medications on File Prior to Visit  Medication Sig Dispense Refill  . amLODipine-benazepril (LOTREL) 10-20 MG capsule TAKE ONE CAPSULE BY MOUTH ONCE DAILY 90 capsule 3  . aspirin EC 81 MG tablet Take 81 mg by mouth daily.    . finasteride (PROSCAR) 5 MG tablet Take 1 tablet (5 mg total) by mouth daily. --- Office visit needed for further refills 90 tablet 0  . folic acid (FOLVITE) 478 MCG tablet Take 400 mcg by mouth daily.    . primidone (MYSOLINE) 50 MG tablet Take 1 tablet (50 mg total) by mouth at bedtime. 90 tablet 1  . propranolol (INDERAL) 10 MG tablet TAKE ONE TABLET BY MOUTH TWICE DAILY FOR ESSENTIAL TREMOR 180 tablet 0  . tamsulosin (FLOMAX) 0.4 MG CAPS capsule Take 1 capsule (0.4 mg total) by mouth daily. 90 capsule 3   No current facility-administered medications on file prior to visit.     Past Medical History:  Diagnosis Date  . Basal cell cancer   . BPH (benign prostatic hypertrophy)    Dr Junious Silk  . Chronic kidney disease    Hx of clear fluid sacs on both kidneys. Drained about 2006  . Diverticulosis of colon   . Elevated PSA    [facility]  . History of kidney stones   . Hx of colonic polyps   . Hyperlipidemia   . Hypertension   . Melanoma [facility])     Past Surgical History:  Procedure Laterality Date  . basal cell     R  & L temple; Dr Allyson Sabal  . COLONOSCOPY W/ POLYPECTOMY  1997, 2011   Dr Earlean Shawl  . melanoma     L forehead  . Right rotator cuff sx    . TOTAL HIP ARTHROPLASTY Left 12/19/2014   Procedure: LEFT TOTAL HIP ARTHROPLASTY ANTERIOR APPROACH;  Surgeon: Mcarthur Rossetti, MD;  Location: [facility];  Service: Orthopedics;  Laterality: Left;    Social History   Socioeconomic History  . Marital status: Married    Spouse name: None  . Number of children: None  . Years of education: None  . Highest  education level: None  Social Needs  . Financial resource strain: None  . Food insecurity - worry: None  . Food insecurity - inability: None  . Transportation needs - medical: None  . Transportation needs - non-medical: None  Occupational History  . Occupation: retired    Comment: Insurance underwriter  Tobacco Use  . Smoking status: Never Smoker  . Smokeless tobacco: Never Used  Substance and Sexual Activity  . Alcohol use: No    Alcohol/week: 0.0 oz  . Drug use: No  . Sexual activity: None  Other  Topics Concern  . None  Social History Narrative   Walks twice daily for exercise    Family History  Problem Relation Age of Onset  . Colon cancer Brother 24  . Other Sister        addisons disease  . Dementia Sister   . Stroke Brother   . Stroke Mother 55  . Heart attack Father 16  . Stroke Father   . Stroke Paternal Grandfather        in 65s  . Diabetes Son        IDDM  . Drug abuse Son     Review of Systems  Constitutional: Negative for chills and fever.  HENT: Positive for hearing loss (chronic) and tinnitus.   Eyes: Negative for visual disturbance.  Respiratory: Negative for cough, shortness of breath and wheezing.   Cardiovascular: Positive for palpitations (occ) and leg swelling (mild, occ). Negative for chest pain.  Endocrine: Positive for cold intolerance.  Neurological: Negative for light-headedness and headaches.  Psychiatric/Behavioral: Negative for dysphoric mood. The patient is not nervous/anxious.        Objective:   Vitals:   08/01/17 0908  BP: 118/64  Pulse: 61  Resp: 16  Temp: 97.6 F (36.4 C)  SpO2: 98%   Wt Readings from Last 3 Encounters:  08/01/17 141 lb (64 kg)  03/27/17 144 lb (65.3 kg)  09/20/16 147 lb (66.7 kg)   Body mass index is 21.44 kg/m.   Physical Exam    Constitutional: Appears well-developed and well-nourished. No distress.  HENT:  Head: Normocephalic and atraumatic.  Neck: Neck supple. No tracheal deviation present. No  thyromegaly present.  No cervical lymphadenopathy Cardiovascular: Normal rate, regular rhythm and normal heart sounds.   No murmur heard. No carotid bruit .  No edema Pulmonary/Chest: Effort normal and breath sounds normal. No respiratory distress. No has no wheezes. No rales.  Skin: Skin is warm and dry. Not diaphoretic.  Psychiatric: Normal mood and affect. Behavior is normal.      Assessment & Plan:   Wellness Exam: Immunizations  Td and flu due, discussed shingrix Colonoscopy - no longer needed due to age Eye exam   Up to date  Hearing loss  - yes, wears hearing aids Memory concerns/difficulties -- yes only related to typical aging memory issues - no concerning memory issues - more of a frustration - does not feel he needs further evaluation Independent of ADLs   fully Stressed the importance of regular exercise   Patient received copy of preventative screening tests/immunizations recommended for the next 5-10 years.    See Problem List for Assessment and Plan of chronic medical problems.   F/u in one year   See Problem List for Assessment and Plan of chronic medical problems.

## 2017-08-01 ENCOUNTER — Encounter: Payer: Self-pay | Admitting: Internal Medicine

## 2017-08-01 ENCOUNTER — Other Ambulatory Visit (INDEPENDENT_AMBULATORY_CARE_PROVIDER_SITE_OTHER): Payer: Medicare Other

## 2017-08-01 ENCOUNTER — Ambulatory Visit (INDEPENDENT_AMBULATORY_CARE_PROVIDER_SITE_OTHER): Payer: Medicare Other | Admitting: Internal Medicine

## 2017-08-01 VITALS — BP 118/64 | HR 61 | Temp 97.6°F | Resp 16 | Ht 68.0 in | Wt 141.0 lb

## 2017-08-01 DIAGNOSIS — Z0000 Encounter for general adult medical examination without abnormal findings: Secondary | ICD-10-CM

## 2017-08-01 DIAGNOSIS — E7849 Other hyperlipidemia: Secondary | ICD-10-CM | POA: Diagnosis not present

## 2017-08-01 DIAGNOSIS — Z85820 Personal history of malignant melanoma of skin: Secondary | ICD-10-CM

## 2017-08-01 DIAGNOSIS — R7303 Prediabetes: Secondary | ICD-10-CM | POA: Diagnosis not present

## 2017-08-01 DIAGNOSIS — Z23 Encounter for immunization: Secondary | ICD-10-CM | POA: Diagnosis not present

## 2017-08-01 DIAGNOSIS — G250 Essential tremor: Secondary | ICD-10-CM | POA: Diagnosis not present

## 2017-08-01 DIAGNOSIS — N400 Enlarged prostate without lower urinary tract symptoms: Secondary | ICD-10-CM

## 2017-08-01 DIAGNOSIS — I10 Essential (primary) hypertension: Secondary | ICD-10-CM

## 2017-08-01 LAB — COMPREHENSIVE METABOLIC PANEL
ALBUMIN: 4.1 g/dL (ref 3.5–5.2)
ALK PHOS: 51 U/L (ref 39–117)
ALT: 15 U/L (ref 0–53)
AST: 15 U/L (ref 0–37)
BILIRUBIN TOTAL: 0.6 mg/dL (ref 0.2–1.2)
BUN: 25 mg/dL — AB (ref 6–23)
CALCIUM: 9.2 mg/dL (ref 8.4–10.5)
CHLORIDE: 102 meq/L (ref 96–112)
CO2: 29 mEq/L (ref 19–32)
CREATININE: 1.13 mg/dL (ref 0.40–1.50)
GFR: 65.3 mL/min (ref >60.00)
Glucose, Bld: 98 mg/dL (ref 70–99)
Potassium: 4.5 mEq/L (ref 3.5–5.1)
SODIUM: 136 meq/L (ref 135–145)
TOTAL PROTEIN: 6.9 g/dL (ref 6.0–8.3)

## 2017-08-01 LAB — CBC WITH DIFFERENTIAL/PLATELET
BASOS PCT: 0.6 % (ref 0.0–3.0)
Basophils Absolute: 0 10*3/uL (ref 0.0–0.1)
EOS ABS: 0.1 10*3/uL (ref 0.0–0.7)
EOS PCT: 1.9 % (ref 0.0–5.0)
HEMATOCRIT: 41.5 % (ref 39.0–52.0)
HEMOGLOBIN: 13.9 g/dL (ref 13.0–17.0)
LYMPHS PCT: 26.7 % (ref 12.0–46.0)
Lymphs Abs: 2 10*3/uL (ref 0.7–4.0)
MCHC: 33.5 g/dL (ref 30.0–36.0)
MCV: 91.7 fl (ref 78.0–100.0)
MONO ABS: 0.6 10*3/uL (ref 0.1–1.0)
Monocytes Relative: 8 % (ref 3.0–12.0)
Neutro Abs: 4.8 10*3/uL (ref 1.4–7.7)
Neutrophils Relative %: 62.8 % (ref 43.0–77.0)
Platelets: 236 10*3/uL (ref 150.0–400.0)
RBC: 4.53 Mil/uL (ref 4.22–5.81)
RDW: 12.8 % (ref 11.5–15.5)
WBC: 7.6 10*3/uL (ref 4.0–10.5)

## 2017-08-01 LAB — LIPID PANEL
CHOLESTEROL: 194 mg/dL (ref 0–200)
HDL: 78.3 mg/dL (ref >39.00)
LDL Cholesterol: 104 mg/dL — ABNORMAL HIGH (ref 0–99)
NONHDL: 115.28
Total CHOL/HDL Ratio: 2
Triglycerides: 57 mg/dL (ref 0.0–149.0)
VLDL: 11.4 mg/dL (ref 0.0–40.0)

## 2017-08-01 LAB — HEMOGLOBIN A1C: HEMOGLOBIN A1C: 5.9 % (ref 4.6–6.5)

## 2017-08-01 LAB — TSH: TSH: 3.26 u[IU]/mL (ref 0.35–4.50)

## 2017-08-01 NOTE — Assessment & Plan Note (Signed)
Following with Dr Tat On propranolol and primidone controlled

## 2017-08-01 NOTE — Assessment & Plan Note (Signed)
Check a1c Low sugar / carb diet Stressed regular exercise   

## 2017-08-01 NOTE — Assessment & Plan Note (Signed)
BP well controlled Current regimen effective and well tolerated Continue current medications at current doses Cmp, cbc, tsh 

## 2017-08-01 NOTE — Assessment & Plan Note (Signed)
Diet controlled Check lipids, cmp

## 2017-08-01 NOTE — Assessment & Plan Note (Signed)
Up to date with derm appt

## 2017-08-01 NOTE — Assessment & Plan Note (Addendum)
Controlled, stable Continue current dose of medication - proscar and flomax Sees urology

## 2017-08-04 ENCOUNTER — Telehealth: Payer: Self-pay | Admitting: Internal Medicine

## 2017-08-04 NOTE — Telephone Encounter (Signed)
Copied from [place] [id number]. Topic: Quick Communication - See Telephone Encounter >> Aug 04, 2017  9:29 AM Ahmed Prima L wrote: CRM for notification. See Telephone encounter for:   08/04/17.  Pt is requesting a copy of his lab results from 12/18. Please call if he can pick these up. [phone]

## 2017-08-04 NOTE — Telephone Encounter (Signed)
Spoke with pt, okay with mailing labs.

## 2017-08-10 ENCOUNTER — Other Ambulatory Visit: Payer: Self-pay | Admitting: Internal Medicine

## 2017-08-14 ENCOUNTER — Other Ambulatory Visit: Payer: Self-pay | Admitting: Internal Medicine

## 2017-09-26 NOTE — Progress Notes (Signed)
Roger Ingram was seen today in the movement disorders clinic for neurologic consultation at the request of Burns, Claudina Lick, MD.  The consultation is for the evaluation of tremor.  He has seen a neurologist in the past regarding his tremor. Pt states that his neurologist was told that he had "a palsy of the 6th nerve."   Pt/family worried about the development of PD.  09/20/16 update:  Patient follows up today for his essential tremor.  He is on very low-dose Inderal 10 mg daily.  Last visit, I started him on primidone, 50 mg daily.  He states that it is helping.  No SE with the medication.  03/27/17 update:  Patient seen today in follow-up for essential tremor.  He is on Inderal, 10 mg twice per day and primidone 50 mg daily.  The patient reports that he has been doing fairly well.  No change in tremor.  No side effects with his medication.  09/27/17 update: Patient is seen today for essential tremor.  Patient remains on Inderal, 10 mg twice per day and primidone 50 mg once per day.  Tremor has been stable and not getting worse.  Asks about which medication makes him bradycardic.  He has no symptoms of this and states that he has had this for many years.  Records have been reviewed since last visit.  He had his Medicare wellness exam in December.  ALLERGIES:   Allergies  Allergen Reactions  . Hydrocodone     REACTION: diplopia  . Iohexol     Syncope post IV dye for renal calculi 1963    CURRENT MEDICATIONS:  Outpatient Encounter Medications as of 09/27/2017  Medication Sig  . amLODipine-benazepril (LOTREL) 10-20 MG capsule TAKE ONE CAPSULE BY MOUTH ONCE DAILY  . aspirin EC 81 MG tablet Take 81 mg by mouth daily.  . finasteride (PROSCAR) 5 MG tablet Take 1 tablet (5 mg total) by mouth daily.  . folic acid (FOLVITE) 818 MCG tablet Take 400 mcg by mouth daily.  . primidone (MYSOLINE) 50 MG tablet Take 1 tablet (50 mg total) by mouth at bedtime.  . propranolol (INDERAL) 10 MG tablet TAKE 1  TABLET BY MOUTH TWICE DAILY FOR ESSENTIAL TREMOR  . tamsulosin (FLOMAX) 0.4 MG CAPS capsule Take 1 capsule (0.4 mg total) by mouth daily.  . [DISCONTINUED] primidone (MYSOLINE) 50 MG tablet Take 1 tablet (50 mg total) by mouth at bedtime.   No facility-administered encounter medications on file as of 09/27/2017.     PAST MEDICAL HISTORY:   Past Medical History:  Diagnosis Date  . Basal cell cancer   . BPH (benign prostatic hypertrophy)    Dr Junious Silk  . Chronic kidney disease    Hx of clear fluid sacs on both kidneys. Drained about 2006  . Diverticulosis of colon   . Elevated PSA    [facility]  . History of kidney stones   . Hx of colonic polyps   . Hyperlipidemia   . Hypertension   . Melanoma ([place])     PAST SURGICAL HISTORY:   Past Surgical History:  Procedure Laterality Date  . basal cell     R  & L temple; Dr Allyson Sabal  . COLONOSCOPY W/ POLYPECTOMY  1997, 2011   Dr Earlean Shawl  . melanoma     L forehead  . Right rotator cuff sx    . TOTAL HIP ARTHROPLASTY Left 12/19/2014   Procedure: LEFT TOTAL HIP ARTHROPLASTY ANTERIOR APPROACH;  Surgeon: Harrell Gave  Kerry Fort, MD;  Location: [facility];  Service: Orthopedics;  Laterality: Left;    SOCIAL HISTORY:   Social History   Socioeconomic History  . Marital status: Married    Spouse name: Not on file  . Number of children: Not on file  . Years of education: Not on file  . Highest education level: Not on file  Social Needs  . Financial resource strain: Not on file  . Food insecurity - worry: Not on file  . Food insecurity - inability: Not on file  . Transportation needs - medical: Not on file  . Transportation needs - non-medical: Not on file  Occupational History  . Occupation: retired    Comment: Insurance underwriter  Tobacco Use  . Smoking status: Never Smoker  . Smokeless tobacco: Never Used  Substance and Sexual Activity  . Alcohol use: No    Alcohol/week: 0.0 oz  . Drug use: No  . Sexual activity: Not on file  Other Topics  Concern  . Not on file  Social History Narrative   Walks twice daily for exercise    FAMILY HISTORY:   Family Status  Relation Name Status  . Brother  Deceased  . Sister  Deceased       unknown, multiple health problems  . Brother  Alive       37  . Mother  Deceased  . Father  Deceased  . PGF  (Not Specified)  . Son  Alive  . Daughter  Alive  . Son  Deceased    ROS:  A complete 10 system review of systems was obtained and was unremarkable apart from what is mentioned above.  PHYSICAL EXAMINATION:    VITALS:   Vitals:   09/27/17 0921  BP: 122/70  Pulse: (!) 54  SpO2: 97%  Weight: 144 lb (65.3 kg)  Height: 5\' 8"  (1.727 m)    GEN:  The patient appears stated age and is in NAD. HEENT:  Normocephalic, atraumatic.  The mucous membranes are moist. The superficial temporal arteries are without ropiness or tenderness. CV:  Bradycardic.  regular Lungs:  CTAB Neck/HEME:  There are no carotid bruits bilaterally.  Neurological examination:  Orientation: The patient is alert and oriented x3.  Cranial nerves: There is good facial symmetry. . Extraocular muscles are intact.  The speech is fluent and clear. Soft palate rises symmetrically and there is no tongue deviation. Hearing is intact to conversational tone. Sensation: Sensation is intact to light touch x 4. Motor: Strength is at least antigravity x 4.   Movement examination: Tone: There is normal tone in the bilateral upper extremities.  The tone in the lower extremities is normal.  Abnormal movements: There is no significant rest tremor.  There is postural tremor.  The left is worse than the right.  There is intention tremor.  He has some trouble pouring water from one glass to another. Coordination:  There is no decremation, with any form of RAMS, including alternating supination and pronation of the forearm, hand opening and closing, finger taps, heel taps and toe taps. Gait and Station: The patient has no difficulty  arising out of a deep-seated chair without the use of the hands. The patient's stride length is normal.   Lab Results  Component Value Date   TSH 3.26 08/01/2017     Chemistry      Component Value Date/Time   NA 136 08/01/2017 1002   K 4.5 08/01/2017 1002   CL 102 08/01/2017 1002  CO2 29 08/01/2017 1002   BUN 25 (H) 08/01/2017 1002   CREATININE 1.13 08/01/2017 1002      Component Value Date/Time   CALCIUM 9.2 08/01/2017 1002   ALKPHOS 51 08/01/2017 1002   AST 15 08/01/2017 1002   ALT 15 08/01/2017 1002   BILITOT 0.6 08/01/2017 1002      ASSESSMENT/PLAN:  1.  Essential Tremor.  -Patient would like to continue propranolol, 10 mg twice per day.  He understands that this is the medication that decreases his heart rate, but he is asymptomatic and has had low heart rate for many years.  -Could probably benefit from an increase in primidone, but the patient states that he is happy with his current degree of tremor control.  He does ask if he can move the primidone, 50 mg nightly, to the daytime and I have no objection to that.  -reassured patient that I saw no evidence of PD.  2.  I will follow up with the patient in about 6-8 months, sooner should new neurologic issues arise.     Cc:  Binnie Rail, MD

## 2017-09-27 ENCOUNTER — Encounter: Payer: Self-pay | Admitting: Neurology

## 2017-09-27 ENCOUNTER — Ambulatory Visit (INDEPENDENT_AMBULATORY_CARE_PROVIDER_SITE_OTHER): Payer: Medicare Other | Admitting: Neurology

## 2017-09-27 VITALS — BP 122/70 | HR 54 | Ht 68.0 in | Wt 144.0 lb

## 2017-09-27 DIAGNOSIS — G250 Essential tremor: Secondary | ICD-10-CM | POA: Diagnosis not present

## 2017-09-27 MED ORDER — PRIMIDONE 50 MG PO TABS: 50.0000 mg | ORAL_TABLET | Freq: Every day | ORAL | 1 refills | Status: DC

## 2017-09-27 NOTE — Patient Instructions (Signed)
You look great!  No medication changes today.

## 2017-10-05 ENCOUNTER — Other Ambulatory Visit: Payer: Self-pay | Admitting: Internal Medicine

## 2017-11-01 ENCOUNTER — Other Ambulatory Visit: Payer: Self-pay | Admitting: Emergency Medicine

## 2017-11-01 MED ORDER — AMLODIPINE BESY-BENAZEPRIL HCL 10-20 MG PO CAPS: 1.0000 | ORAL_CAPSULE | Freq: Every day | ORAL | 2 refills | Status: DC

## 2017-12-22 DIAGNOSIS — L814 Other melanin hyperpigmentation: Secondary | ICD-10-CM | POA: Diagnosis not present

## 2017-12-22 DIAGNOSIS — L570 Actinic keratosis: Secondary | ICD-10-CM | POA: Diagnosis not present

## 2017-12-22 DIAGNOSIS — Z85820 Personal history of malignant melanoma of skin: Secondary | ICD-10-CM | POA: Diagnosis not present

## 2017-12-22 DIAGNOSIS — L720 Epidermal cyst: Secondary | ICD-10-CM | POA: Diagnosis not present

## 2017-12-22 DIAGNOSIS — L821 Other seborrheic keratosis: Secondary | ICD-10-CM | POA: Diagnosis not present

## 2018-01-30 NOTE — Progress Notes (Signed)
Subjective:    Patient ID: Roger Ingram, male    DOB: 1929/02/28, 84 y.o.   MRN: [id number]  HPI The patient is here for an acute visit.  He has a hsitory of IBS.  1-1/2 years ago he was here for similar symptoms.  He does have periodic abdominal symptoms and it concerns him because his brother had colon cancer.  Abdominal pain: He is currently experiencing abdominal pain that seems to be focused in the central pelvic region and extends up the right side of his abdomen.  It started 3 days ago and it is a little bit better today.  Currently his pain is 5/10.  He wonders if it is related to constipation because he did take 2 stool softeners yesterday evening and after having 2-3 bowel movements today he is feeling better.  His pain started after he ate a ground beef and noodle casserole.  It did not sit well with him and he ate more the next day for lunch.  Late yesterday he was having more severe pain in his lower abdomen and right side of his abdomen and he was shaking like he had the chills.  He is unsure if he had a fever.   Typically he has 1 bowel movement a day in the morning after he drinks his coffee.  He does not think he is constipated, but occasionally it does feel that he develops constipation.  He has had similar episodes to this in the past.  Urine a half ago we did an abdominal ultrasound that was unremarkable.  Medications and allergies reviewed with patient and updated if appropriate.  Patient Active Problem List   Diagnosis Date Noted  . Osteoarthritis of left hip 12/19/2014  . Status post total replacement of left hip 12/19/2014  . Degenerative joint disease (DJD) of hip 11/13/2014  . Popliteal cyst, unruptured 06/17/2014  . DDD (degenerative disc disease), lumbosacral 10/26/2013  . NOCTURIA 12/28/2009  . Essential hypertension 07/06/2009  . Prediabetes 10/23/2008  . Hyperlipidemia 10/02/2008  . Essential tremor 10/02/2008  . Diverticulosis of large intestine  10/02/2008  . NEURITIS 10/02/2008  . MALIGNANT MELANOMA, HX OF 10/02/2008  . SKIN CANCER, HX OF 10/02/2008  . History of colonic polyps 10/02/2008  . BPH (benign prostatic hyperplasia) 06/02/2008    Current Outpatient Medications on File Prior to Visit  Medication Sig Dispense Refill  . amLODipine-benazepril (LOTREL) 10-20 MG capsule Take 1 capsule by mouth daily. 90 capsule 2  . aspirin EC 81 MG tablet Take 81 mg by mouth daily.    . finasteride (PROSCAR) 5 MG tablet Take 1 tablet (5 mg total) by mouth daily. 90 tablet 3  . folic acid (FOLVITE) 026 MCG tablet Take 400 mcg by mouth daily.    . primidone (MYSOLINE) 50 MG tablet Take 1 tablet (50 mg total) by mouth at bedtime. 90 tablet 1  . propranolol (INDERAL) 10 MG tablet TAKE 1 TABLET BY MOUTH TWICE DAILY FOR ESSENTIAL TREMOR 180 tablet 1  . tamsulosin (FLOMAX) 0.4 MG CAPS capsule TAKE ONE CAPSULE BY MOUTH ONCE DAILY 90 capsule 3   No current facility-administered medications on file prior to visit.     Past Medical History:  Diagnosis Date  . Basal cell cancer   . BPH (benign prostatic hypertrophy)    Dr Junious Silk  . Chronic kidney disease    Hx of clear fluid sacs on both kidneys. Drained about 2006  . Diverticulosis of colon   . Elevated PSA  [facility]  . History of kidney stones   . Hx of colonic polyps   . Hyperlipidemia   . Hypertension   . Melanoma [facility])     Past Surgical History:  Procedure Laterality Date  . basal cell     R  & L temple; Dr Allyson Sabal  . COLONOSCOPY W/ POLYPECTOMY  1997, 2011   Dr Earlean Shawl  . melanoma     L forehead  . Right rotator cuff sx    . TOTAL HIP ARTHROPLASTY Left 12/19/2014   Procedure: LEFT TOTAL HIP ARTHROPLASTY ANTERIOR APPROACH;  Surgeon: Mcarthur Rossetti, MD;  Location: [facility];  Service: Orthopedics;  Laterality: Left;    Social History   Socioeconomic History  . Marital status: Married    Spouse name: Not on file  . Number of children: Not on file  . Years of  education: Not on file  . Highest education level: Not on file  Occupational History  . Occupation: retired    Comment: Sports coach  . Financial resource strain: Not on file  . Food insecurity:    Worry: Not on file    Inability: Not on file  . Transportation needs:    Medical: Not on file    Non-medical: Not on file  Tobacco Use  . Smoking status: Never Smoker  . Smokeless tobacco: Never Used  Substance and Sexual Activity  . Alcohol use: No    Alcohol/week: 0.0 oz  . Drug use: No  . Sexual activity: Not on file  Lifestyle  . Physical activity:    Days per week: Not on file    Minutes per session: Not on file  . Stress: Not on file  Relationships  . Social connections:    Talks on phone: Not on file    Gets together: Not on file    Attends religious service: Not on file    Active member of club or organization: Not on file    Attends meetings of clubs or organizations: Not on file    Relationship status: Not on file  Other Topics Concern  . Not on file  Social History Narrative   Walks twice daily for exercise    Family History  Problem Relation Age of Onset  . Colon cancer Brother 24  . Other Sister        addisons disease  . Dementia Sister   . Stroke Brother   . Stroke Mother 63  . Heart attack Father 78  . Stroke Father   . Stroke Paternal Grandfather        in 87s  . Diabetes Son        IDDM  . Crohn's disease Daughter   . Drug abuse Son     Review of Systems  Constitutional: Positive for appetite change (decreased) and chills (yesterday only). Negative for fever.  Respiratory: Negative for shortness of breath.   Cardiovascular: Negative for chest pain and palpitations.  Gastrointestinal: Positive for abdominal pain, constipation and nausea (mild at times). Negative for blood in stool and vomiting.  Genitourinary: Negative for dysuria, frequency and hematuria.  Neurological: Negative for light-headedness and headaches.       Objective:    Vitals:   01/31/18 1436  BP: (!) 130/58  Pulse: 61  Resp: 16  Temp: 98.4 F (36.9 C)  SpO2: 98%   BP Readings from Last 3 Encounters:  01/31/18 (!) 130/58  09/27/17 122/70  08/01/17 118/64   Wt Readings from Last  3 Encounters:  01/31/18 145 lb (65.8 kg)  09/27/17 144 lb (65.3 kg)  08/01/17 141 lb (64 kg)   Body mass index is 22.05 kg/m.   Physical Exam  Constitutional: He appears well-developed and well-nourished. No distress.  HENT:  Head: Normocephalic and atraumatic.  Abdominal: Soft. He exhibits distension. He exhibits no mass. There is tenderness (Diffuse mild tenderness without rebound or guarding, majority of tenderness and central lower abdomen). There is no rebound and no guarding.  Increased bowel sounds  Musculoskeletal: He exhibits no edema.  Skin: Skin is warm and dry. He is not diaphoretic.           Assessment & Plan:    See Problem List for Assessment and Plan of chronic medical problems.

## 2018-01-31 ENCOUNTER — Encounter: Payer: Self-pay | Admitting: Internal Medicine

## 2018-01-31 ENCOUNTER — Ambulatory Visit (INDEPENDENT_AMBULATORY_CARE_PROVIDER_SITE_OTHER): Payer: Medicare Other | Admitting: Internal Medicine

## 2018-01-31 ENCOUNTER — Other Ambulatory Visit (INDEPENDENT_AMBULATORY_CARE_PROVIDER_SITE_OTHER): Payer: Medicare Other

## 2018-01-31 ENCOUNTER — Ambulatory Visit (INDEPENDENT_AMBULATORY_CARE_PROVIDER_SITE_OTHER)
Admission: RE | Admit: 2018-01-31 | Discharge: 2018-01-31 | Disposition: A | Discharge status: Home / Self Care | Payer: Medicare Other | Source: Ambulatory Visit | Attending: Internal Medicine | Admitting: Internal Medicine

## 2018-01-31 VITALS — BP 130/58 | HR 61 | Temp 98.4°F | Resp 16 | Wt 145.0 lb

## 2018-01-31 DIAGNOSIS — R109 Unspecified abdominal pain: Secondary | ICD-10-CM

## 2018-01-31 LAB — CBC WITH DIFFERENTIAL/PLATELET
BASOS ABS: 0 10*3/uL (ref 0.0–0.1)
Basophils Relative: 0.2 % (ref 0.0–3.0)
Eosinophils Absolute: 0.1 10*3/uL (ref 0.0–0.7)
Eosinophils Relative: 0.4 % (ref 0.0–5.0)
HEMATOCRIT: 37.9 % — AB (ref 39.0–52.0)
Hemoglobin: 12.8 g/dL — ABNORMAL LOW (ref 13.0–17.0)
LYMPHS PCT: 13.6 % (ref 12.0–46.0)
Lymphs Abs: 1.9 10*3/uL (ref 0.7–4.0)
MCHC: 33.7 g/dL (ref 30.0–36.0)
MCV: 89.6 fl (ref 78.0–100.0)
Monocytes Absolute: 2 10*3/uL — ABNORMAL HIGH (ref 0.1–1.0)
Monocytes Relative: 14.4 % — ABNORMAL HIGH (ref 3.0–12.0)
NEUTROS PCT: 71.4 % (ref 43.0–77.0)
Neutro Abs: 10.1 10*3/uL — ABNORMAL HIGH (ref 1.4–7.7)
Platelets: 200 10*3/uL (ref 150.0–400.0)
RBC: 4.23 Mil/uL (ref 4.22–5.81)
RDW: 12.9 % (ref 11.5–15.5)
WBC: 14.2 10*3/uL — ABNORMAL HIGH (ref 4.0–10.5)

## 2018-01-31 LAB — COMPREHENSIVE METABOLIC PANEL
ALK PHOS: 57 U/L (ref 39–117)
ALT: 14 U/L (ref 0–53)
AST: 11 U/L (ref 0–37)
Albumin: 3.9 g/dL (ref 3.5–5.2)
BILIRUBIN TOTAL: 0.5 mg/dL (ref 0.2–1.2)
BUN: 22 mg/dL (ref 6–23)
CALCIUM: 9.4 mg/dL (ref 8.4–10.5)
CO2: 29 mEq/L (ref 19–32)
Chloride: 99 mEq/L (ref 96–112)
Creatinine, Ser: 1.27 mg/dL (ref 0.40–1.50)
GFR: 56.99 mL/min — ABNORMAL LOW (ref >60.00)
GLUCOSE: 118 mg/dL — AB (ref 70–99)
Potassium: 4.9 mEq/L (ref 3.5–5.1)
Sodium: 133 mEq/L — ABNORMAL LOW (ref 135–145)
TOTAL PROTEIN: 6.9 g/dL (ref 6.0–8.3)

## 2018-01-31 MED ORDER — AMLODIPINE BESY-BENAZEPRIL HCL 5-20 MG PO CAPS
1.0000 | ORAL_CAPSULE | Freq: Every day | ORAL | 1 refills | Status: DC
Start: 2018-01-31 — End: 2018-04-26

## 2018-01-31 NOTE — Patient Instructions (Signed)
Have blood work and an x-ray today.   Test(s) ordered today. Your results will be released to [place] (or called to you) after review, usually within 72hours after test completion. If any changes need to be made, you will be notified at that same time.  Take a stool softener for a few days.   Call if no improvement - we will consider a Ct scan and/or referral to GI

## 2018-01-31 NOTE — Assessment & Plan Note (Signed)
Somewhat diffuse abdominal pain-primarily in the lower central abdomen Possibly related to severe constipation since pain has improved after having had 2-3 bowel movements today Increased bowel sounds on exam, abdomen tender diffusely We will check abdominal x-ray to see if he has an abundance of stool Check CBC, CMP Consider CT scan given his history of diverticulitis-he states that is typically in the left lower quadrant and this does not feel that way Consider GI referral He will take a stool softener daily for the next couple of days to ensure that he is not constipated and let me know if his pain does not continue to improve

## 2018-02-01 ENCOUNTER — Ambulatory Visit (INDEPENDENT_AMBULATORY_CARE_PROVIDER_SITE_OTHER)
Admission: RE | Admit: 2018-02-01 | Discharge: 2018-02-01 | Disposition: A | Discharge status: Home / Self Care | Payer: Medicare Other | Source: Ambulatory Visit | Attending: Internal Medicine | Admitting: Internal Medicine

## 2018-02-01 ENCOUNTER — Other Ambulatory Visit: Payer: Self-pay | Admitting: Internal Medicine

## 2018-02-01 DIAGNOSIS — R109 Unspecified abdominal pain: Secondary | ICD-10-CM

## 2018-02-01 DIAGNOSIS — K5732 Diverticulitis of large intestine without perforation or abscess without bleeding: Secondary | ICD-10-CM | POA: Diagnosis not present

## 2018-02-01 MED ORDER — CIPROFLOXACIN HCL 500 MG PO TABS: 500.0000 mg | ORAL_TABLET | Freq: Two times a day (BID) | ORAL | 0 refills | Status: DC

## 2018-02-01 MED ORDER — METRONIDAZOLE 500 MG PO TABS: 500.0000 mg | ORAL_TABLET | Freq: Three times a day (TID) | ORAL | 0 refills | Status: DC

## 2018-02-05 ENCOUNTER — Ambulatory Visit: Payer: Self-pay | Admitting: Internal Medicine

## 2018-02-05 ENCOUNTER — Emergency Department (HOSPITAL_COMMUNITY)
Admission: EM | Admit: 2018-02-05 | Discharge: 2018-02-05 | Disposition: A | Discharge status: Home / Self Care | Payer: Medicare Other | Attending: Emergency Medicine | Admitting: Emergency Medicine

## 2018-02-05 ENCOUNTER — Encounter (HOSPITAL_COMMUNITY): Payer: Self-pay | Admitting: Emergency Medicine

## 2018-02-05 DIAGNOSIS — N189 Chronic kidney disease, unspecified: Secondary | ICD-10-CM | POA: Diagnosis not present

## 2018-02-05 DIAGNOSIS — Z79899 Other long term (current) drug therapy: Secondary | ICD-10-CM | POA: Diagnosis not present

## 2018-02-05 DIAGNOSIS — I129 Hypertensive chronic kidney disease with stage 1 through stage 4 chronic kidney disease, or unspecified chronic kidney disease: Secondary | ICD-10-CM | POA: Insufficient documentation

## 2018-02-05 DIAGNOSIS — K921 Melena: Secondary | ICD-10-CM | POA: Insufficient documentation

## 2018-02-05 DIAGNOSIS — Z7982 Long term (current) use of aspirin: Secondary | ICD-10-CM | POA: Diagnosis not present

## 2018-02-05 DIAGNOSIS — R195 Other fecal abnormalities: Secondary | ICD-10-CM

## 2018-02-05 LAB — CBC
HEMATOCRIT: 39.9 % (ref 39.0–52.0)
HEMOGLOBIN: 13.3 g/dL (ref 13.0–17.0)
MCH: 30.3 pg (ref 26.0–34.0)
MCHC: 33.3 g/dL (ref 30.0–36.0)
MCV: 90.9 fL (ref 78.0–100.0)
Platelets: 285 10*3/uL (ref 150–400)
RBC: 4.39 MIL/uL (ref 4.22–5.81)
RDW: 12.7 % (ref 11.5–15.5)
WBC: 10.1 10*3/uL (ref 4.0–10.5)

## 2018-02-05 LAB — COMPREHENSIVE METABOLIC PANEL
ALT: 22 U/L (ref 17–63)
ANION GAP: 6 (ref 5–15)
AST: 23 U/L (ref 15–41)
Albumin: 4 g/dL (ref 3.5–5.0)
Alkaline Phosphatase: 64 U/L (ref 38–126)
BILIRUBIN TOTAL: 0.5 mg/dL (ref 0.3–1.2)
BUN: 26 mg/dL — AB (ref 6–20)
CALCIUM: 9.6 mg/dL (ref 8.9–10.3)
CO2: 28 mmol/L (ref 22–32)
Chloride: 104 mmol/L (ref 101–111)
Creatinine, Ser: 1.27 mg/dL — ABNORMAL HIGH (ref 0.61–1.24)
GFR calc Af Amer: 57 mL/min — ABNORMAL LOW (ref >60)
GFR, EST NON AFRICAN AMERICAN: 49 mL/min — AB (ref >60)
Glucose, Bld: 113 mg/dL — ABNORMAL HIGH (ref 65–99)
POTASSIUM: 4.7 mmol/L (ref 3.5–5.1)
Sodium: 138 mmol/L (ref 135–145)
TOTAL PROTEIN: 7.3 g/dL (ref 6.5–8.1)

## 2018-02-05 LAB — TYPE AND SCREEN
ABO/RH(D): B POS
Antibody Screen: NEGATIVE

## 2018-02-05 LAB — POC OCCULT BLOOD, ED: Fecal Occult Bld: NEGATIVE

## 2018-02-05 NOTE — Telephone Encounter (Signed)
Spoke with pt to advise it would be okay to go to [facility] ED

## 2018-02-05 NOTE — Discharge Instructions (Addendum)
Keep taking antibiotics. Follow up with primary care provider in 3 days for recheck of hemoglobin. Return to ER immediately if you have worsening symptoms including fever, worsening abdominal pain, severe GI bleeding, or sudden change in symptoms.

## 2018-02-05 NOTE — ED Notes (Signed)
Will hold on orthostatic order, unless labs result out of range. Patient appears stable and denies having dizziness or generalized weakness.

## 2018-02-05 NOTE — ED Provider Notes (Signed)
[facility] DEPT Provider Note   CSN: [id number] Arrival date & time: 02/05/18  1021     History   Chief Complaint Chief Complaint  Patient presents with  . dark stools    HPI Roger Ingram is a 84 y.o. male.  84yo M with past medical history including hypertension, hyperlipidemia, melanoma, CKD who presents with dark stools.  Last week the patient presented to his PCP with constipation and on further examination a CT scan was ordered which showed diverticulitis. He was started on antibiotics which he has been taking.  Yesterday he noted that his stool appeared darker.  He had 2-3 stools yesterday and 2 stools today that were dark.  No gross blood.  He has been taking stool softeners for his constipation.  He denies any lightheadedness or shortness of breath.  He has some mild lower abdominal pressure but denies any severe abdominal pain.  He has had no fevers, vomiting, or urinary symptoms. Tolerating PO.  The history is provided by the patient.    Past Medical History:  Diagnosis Date  . Basal cell cancer   . BPH (benign prostatic hypertrophy)    Dr Junious Silk  . Chronic kidney disease    Hx of clear fluid sacs on both kidneys. Drained about 2006  . Diverticulosis of colon   . Elevated PSA    [facility]  . History of kidney stones   . Hx of colonic polyps   . Hyperlipidemia   . Hypertension   . Melanoma [facility])     Patient Active Problem List   Diagnosis Date Noted  . Abdominal pain 01/31/2018  . Osteoarthritis of left hip 12/19/2014  . Status post total replacement of left hip 12/19/2014  . Degenerative joint disease (DJD) of hip 11/13/2014  . Popliteal cyst, unruptured 06/17/2014  . DDD (degenerative disc disease), lumbosacral 10/26/2013  . NOCTURIA 12/28/2009  . Essential hypertension 07/06/2009  . Prediabetes 10/23/2008  . Hyperlipidemia 10/02/2008  . Essential tremor 10/02/2008  . Diverticulosis of large intestine 10/02/2008   . NEURITIS 10/02/2008  . MALIGNANT MELANOMA, HX OF 10/02/2008  . SKIN CANCER, HX OF 10/02/2008  . History of colonic polyps 10/02/2008  . BPH (benign prostatic hyperplasia) 06/02/2008    Past Surgical History:  Procedure Laterality Date  . basal cell     R  & L temple; Dr Allyson Sabal  . COLONOSCOPY W/ POLYPECTOMY  1997, 2011   Dr Earlean Shawl  . melanoma     L forehead  . Right rotator cuff sx    . TOTAL HIP ARTHROPLASTY Left 12/19/2014   Procedure: LEFT TOTAL HIP ARTHROPLASTY ANTERIOR APPROACH;  Surgeon: Mcarthur Rossetti, MD;  Location: [facility];  Service: Orthopedics;  Laterality: Left;        Home Medications    Prior to Admission medications   Medication Sig Start Date End Date Taking? Authorizing Provider  amLODipine-benazepril (LOTREL) 5-20 MG capsule Take 1 capsule by mouth daily. Patient taking differently: Take 1 capsule by mouth daily with supper.  01/31/18  Yes Burns, Claudina Lick, MD  aspirin EC 81 MG tablet Take 81 mg by mouth daily.   Yes [provider]  ciprofloxacin (CIPRO) 500 MG tablet Take 1 tablet (500 mg total) by mouth 2 (two) times daily. 02/01/18  Yes Burns, Claudina Lick, MD  finasteride (PROSCAR) 5 MG tablet Take 1 tablet (5 mg total) by mouth daily. Patient taking differently: Take 5 mg by mouth at bedtime.  08/14/17  Yes  Binnie Rail, MD  folic acid (FOLVITE) 315 MCG tablet Take 400 mcg by mouth daily.   Yes [provider]  metroNIDAZOLE (FLAGYL) 500 MG tablet Take 1 tablet (500 mg total) by mouth 3 (three) times daily. Patient taking differently: Take 500 mg by mouth 2 (two) times daily.  02/01/18  Yes Burns, Claudina Lick, MD  primidone (MYSOLINE) 50 MG tablet Take 1 tablet (50 mg total) by mouth at bedtime. 09/27/17  Yes Tat, Eustace Quail, DO  propranolol (INDERAL) 10 MG tablet TAKE 1 TABLET BY MOUTH TWICE DAILY FOR ESSENTIAL TREMOR 08/10/17  Yes Burns, Claudina Lick, MD  tamsulosin (FLOMAX) 0.4 MG CAPS capsule TAKE ONE CAPSULE BY MOUTH ONCE DAILY Patient taking  differently: TAKE ONE CAPSULE BY MOUTH ONCE DAILY after lunch 10/05/17  Yes Burns, Claudina Lick, MD    Family History Family History  Problem Relation Age of Onset  . Colon cancer Brother 8  . Other Sister        addisons disease  . Dementia Sister   . Stroke Brother   . Stroke Mother 38  . Heart attack Father 84  . Stroke Father   . Stroke Paternal Grandfather        in 30s  . Diabetes Son        IDDM  . Crohn's disease Daughter   . Drug abuse Son     Social History Social History   Tobacco Use  . Smoking status: Never Smoker  . Smokeless tobacco: Never Used  Substance Use Topics  . Alcohol use: No    Alcohol/week: 0.0 oz  . Drug use: No     Allergies   Hydrocodone and Iohexol   Review of Systems Review of Systems All other systems reviewed and are negative except that which was mentioned in HPI   Physical Exam Updated Vital Signs BP 135/75 (BP Location: Right Arm)   Pulse (!) 59   Temp 97.9 F (36.6 C) (Oral)   Resp 18   SpO2 99%   Physical Exam  Constitutional: He is oriented to person, place, and time. He appears well-developed and well-nourished. No distress.  HENT:  Head: Normocephalic and atraumatic.  Moist mucous membranes  Eyes: Conjunctivae are normal.  Neck: Neck supple.  Cardiovascular: Normal rate, regular rhythm and normal heart sounds.  No murmur heard. Pulmonary/Chest: Effort normal and breath sounds normal.  Abdominal: Soft. Bowel sounds are normal. He exhibits no distension. There is no tenderness.  Genitourinary: Rectal exam shows guaiac negative stool.  Genitourinary Comments: Greenish stool, no melena, no gross blood  Musculoskeletal: He exhibits no edema.  Neurological: He is alert and oriented to person, place, and time.  Fluent speech  Skin: Skin is warm and dry.  Psychiatric: He has a normal mood and affect. Judgment normal.  Nursing note and vitals reviewed. Chaperone was present during exam.    ED Treatments / Results    Labs (all labs ordered are listed, but only abnormal results are displayed) Labs Reviewed  COMPREHENSIVE METABOLIC PANEL - Abnormal; Notable for the following components:      Result Value   Glucose, Bld 113 (*)    BUN 26 (*)    Creatinine, Ser 1.27 (*)    GFR calc non Af Amer 49 (*)    GFR calc Af Amer 57 (*)    All other components within normal limits  CBC  POC OCCULT BLOOD, ED  TYPE AND SCREEN    EKG None  Radiology No results  found.  Procedures Procedures (including critical care time)  Medications Ordered in ED Medications - No data to display   Initial Impression / Assessment and Plan / ED Course  I have reviewed the triage vital signs and the nursing notes.  Pertinent labs  that were available during my care of the patient were reviewed by me and considered in my medical decision making (see chart for details).    PT well appearing on exam, normal VS. abdomen soft and nontender.  I reviewed his recent CT imaging which shows unconjugated diverticulitis.  It sounds like his symptoms have improved regarding the diverticulitis itself, the only thing that has changed is the color of his stools.  Lab work today shows normal CBC with hemoglobin 13.3, creatinine 1.27 similar to previous.  Hemoccult is negative and he has no melena on exam.  Given that he is currently being treated for diverticulitis and has no evidence of GI bleeding, I feel he is appropriate for continued outpatient management, finishing his antibiotics and seeing his PCP later this week for hemoglobin recheck.  I discussed this plan with him as well as extensively reviewed return precautions.  He voiced understanding.  Final Clinical Impressions(s) / ED Diagnoses   Final diagnoses:  Dark stools    ED Discharge Orders    None       Avia Merkley, Wenda Overland, MD 02/05/18 1440

## 2018-02-05 NOTE — Telephone Encounter (Signed)
He called in c/o having black stools.   "I started 2 antibiotics last Wednesday for a possible infection in my intestine".    "Prior to starting the antibiotics I was not having black stools".  Denies any other symptoms over than a mild abd pain which he said is his normal with the diverticulitis.   The protocol is to go to the ED however since he is being treated for a GI issue I called the flow coordinator.   She checked with Dr. Quay Burow and she does want the pt to go to the ED. I let Roger Ingram know Dr. Quay Burow wants him to go to the ED.   He was agreeable.   He is going to [facility] ED.  I routed a note to the nurse pool for Dr. Quay Burow making her aware.  Reason for Disposition . Tarry or jet black-colored stool (not dark green)    Has diverticulitis.  On 2 antibiotics - Cipro and flagyl  Answer Assessment - Initial Assessment Questions 1. COLOR: "What color is it?" "Is that color in part or all of the stool?"     I'm having black stools.  I'm on 2 antibiotics started last Wednesday.   Prior to that no black stools.    I was wondering if I was bleeding. 2. ONSET: "When was the unusual color first noted?"     Yesterday morning.   No red or blood mixed in.    I was in for a constipation problem.    Dr. Lennette Bihari I had an infection.    3. CAUSE: "Have you eaten any food or taken any medicine of this color?" (See listing in BACKGROUND)     Cipro, flygayl 4. OTHER SYMPTOMS: "Do you have any other symptoms?" (e.g., diarrhea, jaundice, abdominal pain, fever).     No abd pain or fever.    My problem is constipation.   But I'm having normal formed stools now.  Answer Assessment - Initial Assessment Questions 1. APPEARANCE of BLOOD: "What color is it?" "Is it passed separately, on the surface of the stool, or mixed in with the stool?"      My stool is black.   See other triage notes. 2. AMOUNT: "How much blood was passed?"      Yesterday 3-4 black stools.  Very black this morning. 3. FREQUENCY: "How  many times has blood been passed with the stools?"      *No Answer* 4. ONSET: "When was the blood first seen in the stools?" (Days or weeks)      See prior triage notes. 5. DIARRHEA: "Is there also some diarrhea?" If so, ask: "How many diarrhea stools were passed in past 24 hours?"      No 6. CONSTIPATION: "Do you have constipation?" If so, "How bad is it?"     Not now.  I have diverticulolitis. 7. RECURRENT SYMPTOMS: "Have you had blood in your stools before?" If so, ask: "When was the last time?" and "What happened that time?"      No 8. BLOOD THINNERS: "Do you take any blood thinners?" (e.g., Coumadin/warfarin, Pradaxa/dabigatran, aspirin)     Aspirin a day. 9. OTHER SYMPTOMS: "Do you have any other symptoms?"  (e.g., abdominal pain, vomiting, dizziness, fever)     No real abd pain outside my normal that is mild with the diverticulitis. 10. PREGNANCY: "Is there any chance you are pregnant?" "When was your last menstrual period?"       N/A  Protocols used:  RECTAL BLEEDING-A-AH, STOOLS - UNUSUAL COLOR-A-AH

## 2018-02-05 NOTE — ED Notes (Signed)
Pt ambulated to bathroom unassisted and back with steady gait. RN aware.

## 2018-02-05 NOTE — ED Triage Notes (Signed)
Saw doctor at [facility] last week for constipation. Was put on 2 antibiotics for possible inflammation that was seen on scan.   Pt having dark stools since yesterday morning.

## 2018-02-07 NOTE — Progress Notes (Signed)
Subjective:    Patient ID: Roger Ingram, male    DOB: 02-09-1929, 84 y.o.   MRN: [id number]  HPI The patient is here for follow up.  01/31/18:  Seen by me for abdominal pain.  Ct scan showed diverticulitis.  He is was started on Flagyl and cipro.    6/24/19Martin Majestic to ED for dark stools. He noticed dark stools the day prior.  He had 2-3 stools that day and the day he went to the ED and all were dark.  He denies gross blood.  He had some mild lower abdominal pressure, but no severe pain.  He denied fever, vomiting and urinary symptoms. His abdomen was non tender and he was hemodynamically stable.  He was fecal occult negative.  His cbc was normal.  He was discharged home.   Diverticulitis, dark stools: 2 days ago he discontinued the antibiotics.  He took them for approximately 5 days.  He believes the antibiotics were causing the dark stools.  Today his stools were normal color.  He denies any blood in the stool.  He is still having some mild left lower quadrant abdominal discomfort and rates it as a 3-4/10.  Today he had some mild nausea in the morning.  His stools are starting to become more formed.  He denies any fevers or chills.  His energy level is improving.  His appetite is low to begin with and has improved a little bit, but is still low.  This is his first episode of diverticulitis.   Last colonoscopy on file 2011:  Colon polyp in rectosigmoid, large internal hemorrhoid and extensive left diverticulosis.     Medications and allergies reviewed with patient and updated if appropriate.  Patient Active Problem List   Diagnosis Date Noted  . Abdominal pain 01/31/2018  . Osteoarthritis of left hip 12/19/2014  . Status post total replacement of left hip 12/19/2014  . Degenerative joint disease (DJD) of hip 11/13/2014  . Popliteal cyst, unruptured 06/17/2014  . DDD (degenerative disc disease), lumbosacral 10/26/2013  . NOCTURIA 12/28/2009  . Essential hypertension 07/06/2009  .  Prediabetes 10/23/2008  . Hyperlipidemia 10/02/2008  . Essential tremor 10/02/2008  . Diverticulosis of large intestine 10/02/2008  . NEURITIS 10/02/2008  . MALIGNANT MELANOMA, HX OF 10/02/2008  . SKIN CANCER, HX OF 10/02/2008  . History of colonic polyps 10/02/2008  . BPH (benign prostatic hyperplasia) 06/02/2008    Current Outpatient Medications on File Prior to Visit  Medication Sig Dispense Refill  . amLODipine-benazepril (LOTREL) 5-20 MG capsule Take 1 capsule by mouth daily. (Patient taking differently: Take 1 capsule by mouth daily with supper. ) 90 capsule 1  . aspirin EC 81 MG tablet Take 81 mg by mouth daily.    . finasteride (PROSCAR) 5 MG tablet Take 1 tablet (5 mg total) by mouth daily. (Patient taking differently: Take 5 mg by mouth at bedtime. ) 90 tablet 3  . folic acid (FOLVITE) 250 MCG tablet Take 400 mcg by mouth daily.    . metroNIDAZOLE (FLAGYL) 500 MG tablet Take 1 tablet (500 mg total) by mouth 3 (three) times daily. (Patient taking differently: Take 500 mg by mouth 2 (two) times daily. ) 30 tablet 0  . primidone (MYSOLINE) 50 MG tablet Take 1 tablet (50 mg total) by mouth at bedtime. 90 tablet 1  . propranolol (INDERAL) 10 MG tablet TAKE 1 TABLET BY MOUTH TWICE DAILY FOR ESSENTIAL TREMOR 180 tablet 1  . tamsulosin (FLOMAX) 0.4 MG  CAPS capsule TAKE ONE CAPSULE BY MOUTH ONCE DAILY (Patient taking differently: TAKE ONE CAPSULE BY MOUTH ONCE DAILY after lunch) 90 capsule 3   No current facility-administered medications on file prior to visit.     Past Medical History:  Diagnosis Date  . Basal cell cancer   . BPH (benign prostatic hypertrophy)    Dr Junious Silk  . Chronic kidney disease    Hx of clear fluid sacs on both kidneys. Drained about 2006  . Diverticulosis of colon   . Elevated PSA    [facility]  . History of kidney stones   . Hx of colonic polyps   . Hyperlipidemia   . Hypertension   . Melanoma [facility])     Past Surgical History:  Procedure  Laterality Date  . basal cell     R  & L temple; Dr Allyson Sabal  . COLONOSCOPY W/ POLYPECTOMY  1997, 2011   Dr Earlean Shawl  . melanoma     L forehead  . Right rotator cuff sx    . TOTAL HIP ARTHROPLASTY Left 12/19/2014   Procedure: LEFT TOTAL HIP ARTHROPLASTY ANTERIOR APPROACH;  Surgeon: Mcarthur Rossetti, MD;  Location: [facility];  Service: Orthopedics;  Laterality: Left;    Social History   Socioeconomic History  . Marital status: Married    Spouse name: Not on file  . Number of children: Not on file  . Years of education: Not on file  . Highest education level: Not on file  Occupational History  . Occupation: retired    Comment: Sports coach  . Financial resource strain: Not on file  . Food insecurity:    Worry: Not on file    Inability: Not on file  . Transportation needs:    Medical: Not on file    Non-medical: Not on file  Tobacco Use  . Smoking status: Never Smoker  . Smokeless tobacco: Never Used  Substance and Sexual Activity  . Alcohol use: No    Alcohol/week: 0.0 oz  . Drug use: No  . Sexual activity: Not on file  Lifestyle  . Physical activity:    Days per week: Not on file    Minutes per session: Not on file  . Stress: Not on file  Relationships  . Social connections:    Talks on phone: Not on file    Gets together: Not on file    Attends religious service: Not on file    Active member of club or organization: Not on file    Attends meetings of clubs or organizations: Not on file    Relationship status: Not on file  Other Topics Concern  . Not on file  Social History Narrative   Walks twice daily for exercise    Family History  Problem Relation Age of Onset  . Colon cancer Brother 51  . Other Sister        addisons disease  . Dementia Sister   . Stroke Brother   . Stroke Mother 46  . Heart attack Father 1  . Stroke Father   . Stroke Paternal Grandfather        in 74s  . Diabetes Son        IDDM  . Crohn's disease Daughter   . Drug  abuse Son     Review of Systems  Constitutional: Positive for appetite change (appetite is average, but has not had a big appetite). Negative for chills, fatigue and fever.  Respiratory: Negative for shortness  of breath.   Cardiovascular: Negative for chest pain and palpitations.  Gastrointestinal: Positive for abdominal pain and nausea. Negative for blood in stool (no longer black), constipation, diarrhea and vomiting.       No gerd  Neurological: Negative for light-headedness and headaches.       Objective:   Vitals:   02/08/18 1000  BP: (!) 144/72  Pulse: 73  Resp: 16  Temp: 97.7 F (36.5 C)  SpO2: 98%   BP Readings from Last 3 Encounters:  02/08/18 (!) 144/72  02/05/18 135/75  01/31/18 (!) 130/58   Wt Readings from Last 3 Encounters:  02/08/18 143 lb (64.9 kg)  01/31/18 145 lb (65.8 kg)  09/27/17 144 lb (65.3 kg)   Body mass index is 21.74 kg/m.   Physical Exam    Constitutional: Appears well-developed and well-nourished. No distress.  HENT:  Head: Normocephalic and atraumatic.  Abdomen: Soft, nondistended, minimal tenderness left lower quadrant without rebound or guarding, no other abdominal tenderness, no masses, no hepatosplenomegaly Psychiatric: Normal mood and affect. Behavior is normal.      Assessment & Plan:    See Problem List for Assessment and Plan of chronic medical problems.

## 2018-02-08 ENCOUNTER — Encounter: Payer: Self-pay | Admitting: Internal Medicine

## 2018-02-08 ENCOUNTER — Ambulatory Visit (INDEPENDENT_AMBULATORY_CARE_PROVIDER_SITE_OTHER): Payer: Medicare Other | Admitting: Internal Medicine

## 2018-02-08 VITALS — BP 144/72 | HR 73 | Temp 97.7°F | Resp 16 | Wt 143.0 lb

## 2018-02-08 DIAGNOSIS — K5732 Diverticulitis of large intestine without perforation or abscess without bleeding: Secondary | ICD-10-CM | POA: Insufficient documentation

## 2018-02-08 DIAGNOSIS — R195 Other fecal abnormalities: Secondary | ICD-10-CM | POA: Diagnosis not present

## 2018-02-08 MED ORDER — AMOXICILLIN-POT CLAVULANATE 875-125 MG PO TABS: 1.0000 | ORAL_TABLET | Freq: Two times a day (BID) | ORAL | 0 refills | Status: DC

## 2018-02-08 NOTE — Assessment & Plan Note (Signed)
Took Flagyl/Cipro for 5 days, but discontinued secondary to dark stools, which he believes it was a side effect from the antibiotics Still has some mild left lower quadrant discomfort, stools are improving, energy level improving no fevers or chills Blood work from the ED showed no longer elevated white blood cell count has resolved Concern for partially treated diverticulitis-we will prescribe Augmentin twice daily for 5 more days just to make sure that the infection is completely treated Continue low fiber diet and once he completes antibiotics and his symptoms have resolved he can slowly increase the fiber back Avoid nuts/seeds Discussed the risk of recurrence

## 2018-02-08 NOTE — Patient Instructions (Addendum)
Take the augmentin as prescribed.  Call if your symptoms do not completely resolve.      Diverticulitis Diverticulitis is infection or inflammation of small pouches (diverticula) in the colon that form due to a condition called diverticulosis. Diverticula can trap stool (feces) and bacteria, causing infection and inflammation. Diverticulitis may cause severe stomach pain and diarrhea. It may lead to tissue damage in the colon that causes bleeding. The diverticula may also burst (rupture) and cause infected stool to enter other areas of the abdomen. Complications of diverticulitis can include:  Bleeding.  Severe infection.  Severe pain.  Rupture (perforation) of the colon.  Blockage (obstruction) of the colon.  What are the causes? This condition is caused by stool becoming trapped in the diverticula, which allows bacteria to grow in the diverticula. This leads to inflammation and infection. What increases the risk? You are more likely to develop this condition if:  You have diverticulosis. The risk for diverticulosis increases if: ? You are overweight or obese. ? You use tobacco products. ? You do not get enough exercise.  You eat a diet that does not include enough fiber. High-fiber foods include fruits, vegetables, beans, nuts, and whole grains.  What are the signs or symptoms? Symptoms of this condition may include:  Pain and tenderness in the abdomen. The pain is normally located on the left side of the abdomen, but it may occur in other areas.  Fever and chills.  Bloating.  Cramping.  Nausea.  Vomiting.  Changes in bowel routines.  Blood in your stool.  How is this diagnosed? This condition is diagnosed based on:  Your medical history.  A physical exam.  Tests to make sure there is nothing else causing your condition. These tests may include: ? Blood tests. ? Urine tests. ? Imaging tests of the abdomen, including X-rays, ultrasounds, MRIs, or CT  scans.  How is this treated? Most cases of this condition are mild and can be treated at home. Treatment may include:  Taking over-the-counter pain medicines.  Following a clear liquid diet.  Taking antibiotic medicines by mouth.  Rest.  More severe cases may need to be treated at a hospital. Treatment may include:  Not eating or drinking.  Taking prescription pain medicine.  Receiving antibiotic medicines through an IV tube.  Receiving fluids and nutrition through an IV tube.  Surgery.  When your condition is under control, your health care provider may recommend that you have a colonoscopy. This is an exam to look at the entire large intestine. During the exam, a lubricated, bendable tube is inserted into the anus and then passed into the rectum, colon, and other parts of the large intestine. A colonoscopy can show how severe your diverticula are and whether something else may be causing your symptoms. Follow these instructions at home: Medicines  Take over-the-counter and prescription medicines only as told by your health care provider. These include fiber supplements, probiotics, and stool softeners.  If you were prescribed an antibiotic medicine, take it as told by your health care provider. Do not stop taking the antibiotic even if you start to feel better.  Do not drive or use heavy machinery while taking prescription pain medicine. General instructions  Follow a full liquid diet or another diet as directed by your health care provider. After your symptoms improve, your health care provider may tell you to change your diet. He or she may recommend that you eat a diet that contains at least 25 g (25 grams)  of fiber daily. Fiber makes it easier to pass stool. Healthy sources of fiber include: ? Berries. One cup contains 4-8 grams of fiber. ? Beans or lentils. One half cup contains 5-8 grams of fiber. ? Green vegetables. One cup contains 4 grams of fiber.  Exercise for at  least 30 minutes, 3 times each week. You should exercise hard enough to raise your heart rate and break a sweat.  Keep all follow-up visits as told by your health care provider. This is important. You may need a colonoscopy. Contact a health care provider if:  Your pain does not improve.  You have a hard time drinking or eating food.  Your bowel movements do not return to normal. Get help right away if:  Your pain gets worse.  Your symptoms do not get better with treatment.  Your symptoms suddenly get worse.  You have a fever.  You vomit more than one time.  You have stools that are bloody, black, or tarry. Summary  Diverticulitis is infection or inflammation of small pouches (diverticula) in the colon that form due to a condition called diverticulosis. Diverticula can trap stool (feces) and bacteria, causing infection and inflammation.  You are at higher risk for this condition if you have diverticulosis and you eat a diet that does not include enough fiber.  Most cases of this condition are mild and can be treated at home. More severe cases may need to be treated at a hospital.  When your condition is under control, your health care provider may recommend that you have an exam called a colonoscopy. This exam can show how severe your diverticula are and whether something else may be causing your symptoms. This information is not intended to replace advice given to you by your health care provider. Make sure you discuss any questions you have with your health care provider. Document Released: 05/11/2005 Document Revised: 09/03/2016 Document Reviewed: 09/03/2016 Elsevier Interactive Patient Education  [employer].

## 2018-02-08 NOTE — Assessment & Plan Note (Signed)
He went to the emergency room for dark stools H&H normal, Hemoccult normal, no melena in rectal vault No evidence of bleeding Stop antibiotics because he thought that was a side effect from the antibiotics and today his stool is normal color For now monitor No need for further evaluation at this time, but he happy if he has any further dark stools or possible bleeding he will contact me

## 2018-03-02 DIAGNOSIS — H1851 Endothelial corneal dystrophy: Secondary | ICD-10-CM | POA: Diagnosis not present

## 2018-03-02 DIAGNOSIS — H35373 Puckering of macula, bilateral: Secondary | ICD-10-CM | POA: Diagnosis not present

## 2018-03-02 DIAGNOSIS — H2513 Age-related nuclear cataract, bilateral: Secondary | ICD-10-CM | POA: Diagnosis not present

## 2018-03-02 DIAGNOSIS — H52203 Unspecified astigmatism, bilateral: Secondary | ICD-10-CM | POA: Diagnosis not present

## 2018-03-13 ENCOUNTER — Telehealth: Payer: Self-pay | Admitting: Emergency Medicine

## 2018-03-13 NOTE — Telephone Encounter (Signed)
LVM for pt to call back and let us knwow if he wants Amlodipine RX sent to [facility].

## 2018-04-03 ENCOUNTER — Other Ambulatory Visit: Payer: Self-pay | Admitting: Internal Medicine

## 2018-04-11 ENCOUNTER — Other Ambulatory Visit: Payer: Self-pay | Admitting: Neurology

## 2018-04-26 ENCOUNTER — Other Ambulatory Visit: Payer: Self-pay

## 2018-04-26 MED ORDER — AMLODIPINE BESY-BENAZEPRIL HCL 5-20 MG PO CAPS: 1.0000 | ORAL_CAPSULE | Freq: Every day | ORAL | 1 refills | Status: DC

## 2018-05-25 NOTE — Progress Notes (Signed)
Roger Ingram was seen today in the movement disorders clinic for neurologic consultation at the request of Burns, Claudina Lick, MD.  The consultation is for the evaluation of tremor.  He has seen a neurologist in the past regarding his tremor. Pt states that his neurologist was told that he had "a palsy of the 6th nerve."   Pt/family worried about the development of PD.  09/20/16 update:  Patient follows up today for his essential tremor.  He is on very low-dose Inderal 10 mg daily.  Last visit, I started him on primidone, 50 mg daily.  He states that it is helping.  No SE with the medication.  03/27/17 update:  Patient seen today in follow-up for essential tremor.  He is on Inderal, 10 mg twice per day and primidone 50 mg daily.  The patient reports that he has been doing fairly well.  No change in tremor.  No side effects with his medication.  09/27/17 update: Patient is seen today for essential tremor.  Patient remains on Inderal, 10 mg twice per day and primidone 50 mg once per day.  Tremor has been stable and not getting worse.  Asks about which medication makes him bradycardic.  He has no symptoms of this and states that he has had this for many years.  Records have been reviewed since last visit.  He had his Medicare wellness exam in December.  05/28/18 update: Patient is seen today in follow-up for essential tremor.  On propranolol, 10 mg one to two times per day (states no lightheadedness/near syncope) and primidone 50 mg once per day.  Tremor is stable.  Does have occasional trouble eating because of it.  Finds it socially embarrassing.  No falls since last visit.  Medical records have been reviewed.  Was in the emergency room in June with dark stools and while there was evidence of diverticulitis, hemoglobin was normal in the emergency room and Hemoccult was negative.  ALLERGIES:   Allergies  Allergen Reactions  . Hydrocodone Other (See Comments)    Double vision / diplopia  . Iohexol Other  (See Comments)    Syncope post IV dye for renal calculi 1963    CURRENT MEDICATIONS:  Outpatient Encounter Medications as of 05/28/2018  Medication Sig  . amLODipine-benazepril (LOTREL) 5-20 MG capsule Take 1 capsule by mouth daily with supper.  Marland Kitchen aspirin EC 81 MG tablet Take 81 mg by mouth daily.  . finasteride (PROSCAR) 5 MG tablet Take 1 tablet (5 mg total) by mouth daily. (Patient taking differently: Take 5 mg by mouth at bedtime. )  . folic acid (FOLVITE) 778 MCG tablet Take 400 mcg by mouth daily.  . primidone (MYSOLINE) 50 MG tablet TAKE 1 TABLET BY MOUTH AT BEDTIME  . propranolol (INDERAL) 10 MG tablet TAKE 1 TABLET BY MOUTH TWICE DAILY FOR  ESSENTIAL  TREMOR  . tamsulosin (FLOMAX) 0.4 MG CAPS capsule TAKE ONE CAPSULE BY MOUTH ONCE DAILY (Patient taking differently: TAKE ONE CAPSULE BY MOUTH ONCE DAILY after lunch)  . [DISCONTINUED] amoxicillin-clavulanate (AUGMENTIN) 875-125 MG tablet Take 1 tablet by mouth 2 (two) times daily.  . [DISCONTINUED] metroNIDAZOLE (FLAGYL) 500 MG tablet Take 1 tablet (500 mg total) by mouth 3 (three) times daily. (Patient taking differently: Take 500 mg by mouth 2 (two) times daily. )   No facility-administered encounter medications on file as of 05/28/2018.     PAST MEDICAL HISTORY:   Past Medical History:  Diagnosis Date  . Basal cell  cancer   . BPH (benign prostatic hypertrophy)    Dr Junious Silk  . Chronic kidney disease    Hx of clear fluid sacs on both kidneys. Drained about 2006  . Diverticulosis of colon   . Elevated PSA    [facility]  . History of kidney stones   . Hx of colonic polyps   . Hyperlipidemia   . Hypertension   . Melanoma ([place])     PAST SURGICAL HISTORY:   Past Surgical History:  Procedure Laterality Date  . basal cell     R  & L temple; Dr Allyson Sabal  . COLONOSCOPY W/ POLYPECTOMY  1997, 2011   Dr Earlean Shawl  . melanoma     L forehead  . Right rotator cuff sx    . TOTAL HIP ARTHROPLASTY Left 12/19/2014   Procedure:  LEFT TOTAL HIP ARTHROPLASTY ANTERIOR APPROACH;  Surgeon: Mcarthur Rossetti, MD;  Location: [facility];  Service: Orthopedics;  Laterality: Left;    SOCIAL HISTORY:   Social History   Socioeconomic History  . Marital status: Married    Spouse name: Not on file  . Number of children: Not on file  . Years of education: Not on file  . Highest education level: Not on file  Occupational History  . Occupation: retired    Comment: Sports coach  . Financial resource strain: Not on file  . Food insecurity:    Worry: Not on file    Inability: Not on file  . Transportation needs:    Medical: Not on file    Non-medical: Not on file  Tobacco Use  . Smoking status: Never Smoker  . Smokeless tobacco: Never Used  Substance and Sexual Activity  . Alcohol use: No    Alcohol/week: 0.0 standard drinks  . Drug use: No  . Sexual activity: Not on file  Lifestyle  . Physical activity:    Days per week: Not on file    Minutes per session: Not on file  . Stress: Not on file  Relationships  . Social connections:    Talks on phone: Not on file    Gets together: Not on file    Attends religious service: Not on file    Active member of club or organization: Not on file    Attends meetings of clubs or organizations: Not on file    Relationship status: Not on file  . Intimate partner violence:    Fear of current or ex partner: Not on file    Emotionally abused: Not on file    Physically abused: Not on file    Forced sexual activity: Not on file  Other Topics Concern  . Not on file  Social History Narrative   Walks twice daily for exercise    FAMILY HISTORY:   Family Status  Relation Name Status  . Brother  Deceased  . Sister  Deceased       unknown, multiple health problems  . Brother  Alive       76  . Mother  Deceased  . Father  Deceased  . PGF  (Not Specified)  . Son  Alive  . Daughter  Alive  . Son  Deceased    ROS:  A complete 10 system review of systems was  obtained and was unremarkable apart from what is mentioned above.  PHYSICAL EXAMINATION:    VITALS:   Vitals:   05/28/18 1353  BP: 116/62  Pulse: (!) 48  SpO2: 97%  Weight: 143 lb (64.9 kg)  Height: 5\' 8"  (1.727 m)    GEN:  The patient appears stated age and is in NAD. HEENT:  Normocephalic, atraumatic.  The mucous membranes are moist. The superficial temporal arteries are without ropiness or tenderness. CV:  Loletha Grayer, regular with few ectopic beats Lungs:  CTAB Neck/HEME:  There are no carotid bruits bilaterally.  Neurological examination:  Orientation: The patient is alert and oriented x3. Cranial nerves: There is good facial symmetry. The speech is fluent and clear. Soft palate rises symmetrically and there is no tongue deviation. Hearing is intact to conversational tone. Sensation: Sensation is intact to light touch throughout Motor: Strength is 5/5 in the bilateral upper and lower extremities.   Shoulder shrug is equal and symmetric.  There is no pronator drift.  Movement examination: Tone: There is normal tone in the upper and lower extremities. Abnormal movements: There is moderate intention tremor.  Slight trouble with Archimedes spirals bilaterally. Coordination:  There is no decremation with RAM's, with any form of RAMS, including alternating supination and pronation of the forearm, hand opening and closing, finger taps, heel taps and toe taps. Gait and Station: The patient walks well down the hall.    Lab Results  Component Value Date   TSH 3.26 08/01/2017     Chemistry      Component Value Date/Time   NA 138 02/05/2018 1130   K 4.7 02/05/2018 1130   CL 104 02/05/2018 1130   CO2 28 02/05/2018 1130   BUN 26 (H) 02/05/2018 1130   CREATININE 1.27 (H) 02/05/2018 1130      Component Value Date/Time   CALCIUM 9.6 02/05/2018 1130   ALKPHOS 64 02/05/2018 1130   AST 23 02/05/2018 1130   ALT 22 02/05/2018 1130   BILITOT 0.5 02/05/2018 1130       ASSESSMENT/PLAN:  1.  Essential Tremor.  -Patient would like to continue propranolol, 10 mg, 1-2 times per day.  He understands that this is the medication that decreases his heart rate, but he is asymptomatic and has had low heart rate for many years.  -Offered to increase his primidone.  Patient does not want to do that right now.  We will continue 50 mg at night.  -Patient asked about surgery for ET.  Discussed DBS and focused ultrasound and the differences between the 2.  He really is not interested.  At his age, I think I would probably avoid surgery as well  -reassured patient that I saw no evidence of PD.  2.  Follow-up in 6 to 8 months.       Cc:  Binnie Rail, MD

## 2018-05-28 ENCOUNTER — Ambulatory Visit (INDEPENDENT_AMBULATORY_CARE_PROVIDER_SITE_OTHER): Payer: Medicare Other | Admitting: Neurology

## 2018-05-28 ENCOUNTER — Encounter: Payer: Self-pay | Admitting: Neurology

## 2018-05-28 VITALS — BP 116/62 | HR 48 | Ht 68.0 in | Wt 143.0 lb

## 2018-05-28 DIAGNOSIS — G250 Essential tremor: Secondary | ICD-10-CM

## 2018-07-29 NOTE — Patient Instructions (Addendum)
Do not take any advil/ibuprofen or aleve.  You can take tylenol.   Try Biofreeze or Aspercreme for arthritis pain.    Tests ordered today. Your results will be released to [place] (or called to you) after review, usually within 72hours after test completion. If any changes need to be made, you will be notified at that same time.  Medications reviewed and updated.  Changes include :   none   A referral was ordered for urology  Please followup in 6 months

## 2018-07-29 NOTE — Progress Notes (Signed)
Subjective:    Patient ID: Roger Ingram, male    DOB: 1929/01/05, 84 y.o.   MRN: [id number]  HPI The patient is here for follow up.  Hypertension: He is taking his medication daily. He is compliant with a low sodium diet.  He denies chest pain, palpitations, edema, shortness of breath and regular headaches. He is exercising regularly - walking twice a day.      Essential tremor:  He is taking propranolol and primidone.  His tremor are fairly controlled.    BPH;   He is taking flomax and proscar daily.  He is still having very frequent urination and should see urology - he is due.  He denies difficulty urination, painful urination and blood in the urine.    Prediabetes:  He is compliant with a low sugar/carbohydrate diet.  He is exercising regularly.  Right SI pain:  He has been having pain in his SI joint with some radiation down the right leg at times.  He has had epidurals in the past.  He takes advil on occasion.    Medications and allergies reviewed with patient and updated if appropriate.  Patient Active Problem List   Diagnosis Date Noted  . Diverticulitis large intestine 02/08/2018  . Dark stools 02/08/2018  . Abdominal pain 01/31/2018  . Osteoarthritis of left hip 12/19/2014  . Status post total replacement of left hip 12/19/2014  . Degenerative joint disease (DJD) of hip 11/13/2014  . Popliteal cyst, unruptured 06/17/2014  . DDD (degenerative disc disease), lumbosacral 10/26/2013  . NOCTURIA 12/28/2009  . Essential hypertension 07/06/2009  . Prediabetes 10/23/2008  . Hyperlipidemia 10/02/2008  . Essential tremor 10/02/2008  . Diverticulosis of large intestine 10/02/2008  . NEURITIS 10/02/2008  . MALIGNANT MELANOMA, HX OF 10/02/2008  . SKIN CANCER, HX OF 10/02/2008  . History of colonic polyps 10/02/2008  . BPH (benign prostatic hyperplasia) 06/02/2008    Current Outpatient Medications on File Prior to Visit  Medication Sig Dispense Refill  .  amLODipine-benazepril (LOTREL) 5-20 MG capsule Take 1 capsule by mouth daily with supper. 90 capsule 1  . aspirin EC 81 MG tablet Take 81 mg by mouth daily.    . finasteride (PROSCAR) 5 MG tablet Take 1 tablet (5 mg total) by mouth daily. (Patient taking differently: Take 5 mg by mouth at bedtime. ) 90 tablet 3  . folic acid (FOLVITE) 509 MCG tablet Take 400 mcg by mouth daily.    . primidone (MYSOLINE) 50 MG tablet TAKE 1 TABLET BY MOUTH AT BEDTIME 90 tablet 1  . propranolol (INDERAL) 10 MG tablet TAKE 1 TABLET BY MOUTH TWICE DAILY FOR  ESSENTIAL  TREMOR 180 tablet 1  . tamsulosin (FLOMAX) 0.4 MG CAPS capsule TAKE ONE CAPSULE BY MOUTH ONCE DAILY (Patient taking differently: TAKE ONE CAPSULE BY MOUTH ONCE DAILY after lunch) 90 capsule 3   No current facility-administered medications on file prior to visit.     Past Medical History:  Diagnosis Date  . Basal cell cancer   . BPH (benign prostatic hypertrophy)    Dr Junious Silk  . Chronic kidney disease    Hx of clear fluid sacs on both kidneys. Drained about 2006  . Diverticulosis of colon   . Elevated PSA    [facility]  . History of kidney stones   . Hx of colonic polyps   . Hyperlipidemia   . Hypertension   . Melanoma [facility])     Past Surgical History:  Procedure Laterality Date  .  basal cell     R  & L temple; Dr Allyson Sabal  . COLONOSCOPY W/ POLYPECTOMY  1997, 2011   Dr Earlean Shawl  . melanoma     L forehead  . Right rotator cuff sx    . TOTAL HIP ARTHROPLASTY Left 12/19/2014   Procedure: LEFT TOTAL HIP ARTHROPLASTY ANTERIOR APPROACH;  Surgeon: Mcarthur Rossetti, MD;  Location: [facility];  Service: Orthopedics;  Laterality: Left;    Social History   Socioeconomic History  . Marital status: Married    Spouse name: Not on file  . Number of children: Not on file  . Years of education: Not on file  . Highest education level: Not on file  Occupational History  . Occupation: retired    Comment: Sports coach  . Financial  resource strain: Not on file  . Food insecurity:    Worry: Not on file    Inability: Not on file  . Transportation needs:    Medical: Not on file    Non-medical: Not on file  Tobacco Use  . Smoking status: Never Smoker  . Smokeless tobacco: Never Used  Substance and Sexual Activity  . Alcohol use: No    Alcohol/week: 0.0 standard drinks  . Drug use: No  . Sexual activity: Not on file  Lifestyle  . Physical activity:    Days per week: Not on file    Minutes per session: Not on file  . Stress: Not on file  Relationships  . Social connections:    Talks on phone: Not on file    Gets together: Not on file    Attends religious service: Not on file    Active member of club or organization: Not on file    Attends meetings of clubs or organizations: Not on file    Relationship status: Not on file  Other Topics Concern  . Not on file  Social History Narrative   Walks twice daily for exercise    Family History  Problem Relation Age of Onset  . Colon cancer Brother 31  . Other Sister        addisons disease  . Dementia Sister   . Stroke Brother   . Stroke Mother 7  . Heart attack Father 58  . Stroke Father   . Stroke Paternal Grandfather        in 70s  . Diabetes Son        IDDM  . Crohn's disease Daughter   . Drug abuse Son     Review of Systems  Constitutional: Negative for chills and fever.  Respiratory: Negative for cough, shortness of breath and wheezing.   Cardiovascular: Negative for chest pain, palpitations and leg swelling.  Neurological: Positive for tremors. Negative for light-headedness and headaches.       Objective:   Vitals:   08/03/18 1045  BP: 132/74  Pulse: (!) 54  Resp: 16  Temp: 98 F (36.7 C)  SpO2: 98%   BP Readings from Last 3 Encounters:  08/03/18 132/74  05/28/18 116/62  02/08/18 (!) 144/72   Wt Readings from Last 3 Encounters:  08/03/18 145 lb (65.8 kg)  05/28/18 143 lb (64.9 kg)  02/08/18 143 lb (64.9 kg)   Body mass  index is 22.05 kg/m.   Physical Exam    Constitutional: Appears well-developed and well-nourished. No distress.  HENT:  Head: Normocephalic and atraumatic.  Neck: Neck supple. No tracheal deviation present. No thyromegaly present.  No cervical lymphadenopathy Cardiovascular: Normal  rate, regular rhythm and normal heart sounds.   No murmur heard. No carotid bruit .  No edema Pulmonary/Chest: Effort normal and breath sounds normal. No respiratory distress. No has no wheezes. No rales.  Skin: Skin is warm and dry. Not diaphoretic.  Psychiatric: Normal mood and affect. Behavior is normal.      Assessment & Plan:    See Problem List for Assessment and Plan of chronic medical problems.

## 2018-08-03 ENCOUNTER — Encounter: Payer: Self-pay | Admitting: Internal Medicine

## 2018-08-03 ENCOUNTER — Other Ambulatory Visit (INDEPENDENT_AMBULATORY_CARE_PROVIDER_SITE_OTHER): Payer: Medicare Other

## 2018-08-03 ENCOUNTER — Ambulatory Visit (INDEPENDENT_AMBULATORY_CARE_PROVIDER_SITE_OTHER): Payer: Medicare Other | Admitting: Internal Medicine

## 2018-08-03 VITALS — BP 132/74 | HR 54 | Temp 98.0°F | Resp 16 | Ht 68.0 in | Wt 145.0 lb

## 2018-08-03 DIAGNOSIS — G250 Essential tremor: Secondary | ICD-10-CM | POA: Diagnosis not present

## 2018-08-03 DIAGNOSIS — R7303 Prediabetes: Secondary | ICD-10-CM

## 2018-08-03 DIAGNOSIS — M533 Sacrococcygeal disorders, not elsewhere classified: Secondary | ICD-10-CM | POA: Diagnosis not present

## 2018-08-03 DIAGNOSIS — I10 Essential (primary) hypertension: Secondary | ICD-10-CM | POA: Diagnosis not present

## 2018-08-03 DIAGNOSIS — N400 Enlarged prostate without lower urinary tract symptoms: Secondary | ICD-10-CM | POA: Diagnosis not present

## 2018-08-03 DIAGNOSIS — G8929 Other chronic pain: Secondary | ICD-10-CM | POA: Diagnosis not present

## 2018-08-03 LAB — COMPREHENSIVE METABOLIC PANEL
ALBUMIN: 4.2 g/dL (ref 3.5–5.2)
ALK PHOS: 58 U/L (ref 39–117)
ALT: 15 U/L (ref 0–53)
AST: 15 U/L (ref 0–37)
BUN: 24 mg/dL — ABNORMAL HIGH (ref 6–23)
CALCIUM: 9.4 mg/dL (ref 8.4–10.5)
CO2: 27 mEq/L (ref 19–32)
Chloride: 102 mEq/L (ref 96–112)
Creatinine, Ser: 1.17 mg/dL (ref 0.40–1.50)
GFR: 62.58 mL/min (ref >60.00)
Glucose, Bld: 98 mg/dL (ref 70–99)
Potassium: 5.1 mEq/L (ref 3.5–5.1)
Sodium: 135 mEq/L (ref 135–145)
TOTAL PROTEIN: 7.1 g/dL (ref 6.0–8.3)
Total Bilirubin: 0.4 mg/dL (ref 0.2–1.2)

## 2018-08-03 LAB — CBC WITH DIFFERENTIAL/PLATELET
Basophils Absolute: 0 10*3/uL (ref 0.0–0.1)
Basophils Relative: 0.4 % (ref 0.0–3.0)
Eosinophils Absolute: 0.2 10*3/uL (ref 0.0–0.7)
Eosinophils Relative: 2.9 % (ref 0.0–5.0)
HCT: 41.5 % (ref 39.0–52.0)
HEMOGLOBIN: 13.9 g/dL (ref 13.0–17.0)
Lymphocytes Relative: 40.4 % (ref 12.0–46.0)
Lymphs Abs: 2.5 10*3/uL (ref 0.7–4.0)
MCHC: 33.6 g/dL (ref 30.0–36.0)
MCV: 90.4 fl (ref 78.0–100.0)
MONO ABS: 0.6 10*3/uL (ref 0.1–1.0)
Monocytes Relative: 9.7 % (ref 3.0–12.0)
Neutro Abs: 2.9 10*3/uL (ref 1.4–7.7)
Neutrophils Relative %: 46.6 % (ref 43.0–77.0)
Platelets: 219 10*3/uL (ref 150.0–400.0)
RBC: 4.59 Mil/uL (ref 4.22–5.81)
RDW: 12.9 % (ref 11.5–15.5)
WBC: 6.2 10*3/uL (ref 4.0–10.5)

## 2018-08-03 LAB — TSH: TSH: 3.27 u[IU]/mL (ref 0.35–4.50)

## 2018-08-03 LAB — LIPID PANEL
CHOLESTEROL: 194 mg/dL (ref 0–200)
HDL: 63.8 mg/dL (ref >39.00)
LDL CALC: 112 mg/dL — AB (ref 0–99)
NonHDL: 130.22
TRIGLYCERIDES: 90 mg/dL (ref 0.0–149.0)
Total CHOL/HDL Ratio: 3
VLDL: 18 mg/dL (ref 0.0–40.0)

## 2018-08-03 LAB — HEMOGLOBIN A1C: Hgb A1c MFr Bld: 5.9 % (ref 4.6–6.5)

## 2018-08-03 NOTE — Assessment & Plan Note (Signed)
Having R SI joint pain with some radiculopathy Deferred sports med referral today Advised not to take advil due to dec GFR - tylenol and topical medications only

## 2018-08-03 NOTE — Assessment & Plan Note (Signed)
BP well controlled Current regimen effective and well tolerated Continue current medications at current doses cmp  

## 2018-08-03 NOTE — Assessment & Plan Note (Signed)
Check a1c Low sugar / carb diet Stressed regular exercise   

## 2018-08-03 NOTE — Assessment & Plan Note (Signed)
Taking flomax and proscar Due to see urology and would like a referral but would [facility] not to go [facility] Referred to [facility] continue current meds

## 2018-08-03 NOTE — Assessment & Plan Note (Signed)
Has not been taking propranolol twice daily  Taking primidone   Tremors could be better - will take propranolol twice daily consistently

## 2018-08-06 ENCOUNTER — Ambulatory Visit: Payer: Medicare Other | Admitting: Internal Medicine

## 2018-09-14 ENCOUNTER — Telehealth: Payer: Self-pay | Admitting: Emergency Medicine

## 2018-09-14 NOTE — Telephone Encounter (Signed)
LVM with pt stating labs were ready for pick up at front office.

## 2018-09-14 NOTE — Telephone Encounter (Signed)
Copied from [place] [id number]. Topic: General - Other >> Sep 14, 2018 11:31 AM Bea Graff, NT wrote: Reason for CRM: Pt would like a copy of his lab results from last month. He would like to pick a copy up in office.

## 2018-09-27 DIAGNOSIS — N3941 Urge incontinence: Secondary | ICD-10-CM | POA: Diagnosis not present

## 2018-09-27 DIAGNOSIS — N281 Cyst of kidney, acquired: Secondary | ICD-10-CM | POA: Diagnosis not present

## 2018-09-27 DIAGNOSIS — R350 Frequency of micturition: Secondary | ICD-10-CM | POA: Diagnosis not present

## 2018-09-27 DIAGNOSIS — N401 Enlarged prostate with lower urinary tract symptoms: Secondary | ICD-10-CM | POA: Diagnosis not present

## 2018-09-27 DIAGNOSIS — R351 Nocturia: Secondary | ICD-10-CM | POA: Diagnosis not present

## 2018-09-27 DIAGNOSIS — N402 Nodular prostate without lower urinary tract symptoms: Secondary | ICD-10-CM | POA: Insufficient documentation

## 2018-09-27 DIAGNOSIS — N289 Disorder of kidney and ureter, unspecified: Secondary | ICD-10-CM | POA: Diagnosis not present

## 2018-09-27 DIAGNOSIS — N2889 Other specified disorders of kidney and ureter: Secondary | ICD-10-CM | POA: Insufficient documentation

## 2018-09-28 ENCOUNTER — Other Ambulatory Visit: Payer: Self-pay | Admitting: Internal Medicine

## 2018-10-18 ENCOUNTER — Other Ambulatory Visit: Payer: Self-pay | Admitting: Internal Medicine

## 2018-11-06 ENCOUNTER — Other Ambulatory Visit: Payer: Self-pay | Admitting: Neurology

## 2018-11-08 ENCOUNTER — Other Ambulatory Visit: Payer: Self-pay | Admitting: Neurology

## 2019-01-18 ENCOUNTER — Telehealth: Payer: Self-pay | Admitting: Internal Medicine

## 2019-01-18 ENCOUNTER — Other Ambulatory Visit: Payer: Self-pay | Admitting: Internal Medicine

## 2019-01-18 NOTE — Telephone Encounter (Signed)
Copied from [place] [id number]. Topic: Quick Communication - Rx Refill/Question >> Jan 18, 2019  9:54 AM Rayann Heman wrote: Medication:amLODipine-benazepril (LOTREL) 5-20 MG capsule [[id number]]  would like to know if this can be refilled until next appointment on 02/04/19. Please advise   [place], [place] [phone] (Phone) [phone] (Fax)

## 2019-01-18 NOTE — Telephone Encounter (Signed)
Rx sent 

## 2019-01-28 ENCOUNTER — Ambulatory Visit: Payer: Medicare Other | Admitting: Neurology

## 2019-01-30 ENCOUNTER — Other Ambulatory Visit: Payer: Self-pay | Admitting: Internal Medicine

## 2019-02-04 ENCOUNTER — Ambulatory Visit: Payer: Medicare Other | Admitting: Internal Medicine

## 2019-02-10 NOTE — Progress Notes (Signed)
Subjective:    Patient ID: Roger Ingram, male    DOB: 1929/01/06, 85 y.o.   MRN: [id number]  HPI The patient is here for follow up.  He is exercising regularly - walking.     Having increased right hip pain.  It is affecting his walking.   He follows with Dr Ninfa Linden.  He will go to see orthopedics when needed.  He would like to hold off until the coronavirus situation has resolved.  Hypertension: He is taking his medication daily. He is compliant with a low sodium diet.  He denies chest pain, palpitations, edema, shortness of breath and regular headaches.     Essential tremor:  He is taking propranolol and primidone.  His tremor is controlled.   BPH:  He takes proscar and flomax daily.  His urinary symptoms are tolerable.  He was concerned about the long-term effects of taking Proscar, specifically cognitive dysfunction that he read about.  He denies any concerns with his memory currently.  Prediabetes:  He is compliant with a low sugar/carbohydrate diet.  He is exercising regularly.    Medications and allergies reviewed with patient and updated if appropriate.  Patient Active Problem List   Diagnosis Date Noted  . Chronic left SI joint pain 08/03/2018  . Diverticulitis large intestine 02/08/2018  . Dark stools 02/08/2018  . Abdominal pain 01/31/2018  . Osteoarthritis of left hip 12/19/2014  . Status post total replacement of left hip 12/19/2014  . Degenerative joint disease (DJD) of hip 11/13/2014  . Popliteal cyst, unruptured 06/17/2014  . DDD (degenerative disc disease), lumbosacral 10/26/2013  . NOCTURIA 12/28/2009  . Essential hypertension 07/06/2009  . Prediabetes 10/23/2008  . Hyperlipidemia 10/02/2008  . Essential tremor 10/02/2008  . Diverticulosis of large intestine 10/02/2008  . NEURITIS 10/02/2008  . MALIGNANT MELANOMA, HX OF 10/02/2008  . SKIN CANCER, HX OF 10/02/2008  . History of colonic polyps 10/02/2008  . BPH (benign prostatic hyperplasia)  06/02/2008    Current Outpatient Medications on File Prior to Visit  Medication Sig Dispense Refill  . amLODipine-benazepril (LOTREL) 5-20 MG capsule TAKE 1 CAPSULE BY MOUTH ONCE DAILY WITH SUPPER 90 capsule 0  . aspirin EC 81 MG tablet Take 81 mg by mouth daily.    . finasteride (PROSCAR) 5 MG tablet TAKE 1 TABLET BY MOUTH ONCE DAILY 90 tablet 1  . folic acid (FOLVITE) 025 MCG tablet Take 400 mcg by mouth daily.    . primidone (MYSOLINE) 50 MG tablet TAKE 1 TABLET BY MOUTH AT BEDTIME 90 tablet 0  . propranolol (INDERAL) 10 MG tablet TAKE 1 TABLET BY MOUTH TWICE DAILY FOR  ESSENTIAL  TREMOR 180 tablet 1  . tamsulosin (FLOMAX) 0.4 MG CAPS capsule Take 1 capsule by mouth once daily 90 capsule 0   No current facility-administered medications on file prior to visit.     Past Medical History:  Diagnosis Date  . Basal cell cancer   . BPH (benign prostatic hypertrophy)    Dr Junious Silk  . Chronic kidney disease    Hx of clear fluid sacs on both kidneys. Drained about 2006  . Diverticulosis of colon   . Elevated PSA    [facility]  . History of kidney stones   . Hx of colonic polyps   . Hyperlipidemia   . Hypertension   . Melanoma [facility])     Past Surgical History:  Procedure Laterality Date  . basal cell     R  & L temple;  Dr Allyson Sabal  . COLONOSCOPY W/ POLYPECTOMY  1997, 2011   Dr Earlean Shawl  . melanoma     L forehead  . Right rotator cuff sx    . TOTAL HIP ARTHROPLASTY Left 12/19/2014   Procedure: LEFT TOTAL HIP ARTHROPLASTY ANTERIOR APPROACH;  Surgeon: Mcarthur Rossetti, MD;  Location: [facility];  Service: Orthopedics;  Laterality: Left;    Social History   Socioeconomic History  . Marital status: Married    Spouse name: Not on file  . Number of children: Not on file  . Years of education: Not on file  . Highest education level: Not on file  Occupational History  . Occupation: retired    Comment: Sports coach  . Financial resource strain: Not on file  . Food  insecurity    Worry: Not on file    Inability: Not on file  . Transportation needs    Medical: Not on file    Non-medical: Not on file  Tobacco Use  . Smoking status: Never Smoker  . Smokeless tobacco: Never Used  Substance and Sexual Activity  . Alcohol use: No    Alcohol/week: 0.0 standard drinks  . Drug use: No  . Sexual activity: Not on file  Lifestyle  . Physical activity    Days per week: Not on file    Minutes per session: Not on file  . Stress: Not on file  Relationships  . Social Herbalist on phone: Not on file    Gets together: Not on file    Attends religious service: Not on file    Active member of club or organization: Not on file    Attends meetings of clubs or organizations: Not on file    Relationship status: Not on file  Other Topics Concern  . Not on file  Social History Narrative   Walks twice daily for exercise    Family History  Problem Relation Age of Onset  . Colon cancer Brother 79  . Other Sister        addisons disease  . Dementia Sister   . Stroke Brother   . Stroke Mother 6  . Heart attack Father 47  . Stroke Father   . Stroke Paternal Grandfather        in 74s  . Diabetes Son        IDDM  . Crohn's disease Daughter   . Drug abuse Son     Review of Systems  Constitutional: Negative for chills and fever.  Respiratory: Negative for cough, shortness of breath and wheezing.   Cardiovascular: Negative for chest pain, palpitations and leg swelling.  Neurological: Negative for light-headedness and headaches.       Objective:   Vitals:   02/11/19 1416  BP: (!) 146/70  Pulse: (!) 58  Resp: 16  Temp: 98.6 F (37 C)  SpO2: 98%   BP Readings from Last 3 Encounters:  02/11/19 (!) 146/70  08/03/18 132/74  05/28/18 116/62   Wt Readings from Last 3 Encounters:  02/11/19 146 lb (66.2 kg)  08/03/18 145 lb (65.8 kg)  05/28/18 143 lb (64.9 kg)   Body mass index is 22.2 kg/m.   Physical Exam    Constitutional:  Appears well-developed and well-nourished. No distress.  HENT:  Head: Normocephalic and atraumatic.  Neck: Neck supple. No tracheal deviation present. No thyromegaly present.  No cervical lymphadenopathy Cardiovascular: Normal rate, regular rhythm and normal heart sounds.   No murmur heard. No  carotid bruit .  No edema Pulmonary/Chest: Effort normal and breath sounds normal. No respiratory distress. No has no wheezes. No rales.  Skin: Skin is warm and dry. Not diaphoretic.  Psychiatric: Normal mood and affect. Behavior is normal.      Assessment & Plan:    See Problem List for Assessment and Plan of chronic medical problems.

## 2019-02-10 NOTE — Patient Instructions (Addendum)
  Tests ordered today. Your results will be released to MyChart (or called to you) after review, usually within 72hours after test completion. If any changes need to be made, you will be notified at that same time.  Medications reviewed and updated.  Changes include :   none      Please followup in 6 months   

## 2019-02-11 ENCOUNTER — Ambulatory Visit (INDEPENDENT_AMBULATORY_CARE_PROVIDER_SITE_OTHER): Payer: Medicare Other | Admitting: Internal Medicine

## 2019-02-11 ENCOUNTER — Encounter: Payer: Self-pay | Admitting: Internal Medicine

## 2019-02-11 ENCOUNTER — Other Ambulatory Visit: Payer: Self-pay

## 2019-02-11 VITALS — BP 146/70 | HR 58 | Temp 98.6°F | Resp 16 | Ht 68.0 in | Wt 146.0 lb

## 2019-02-11 DIAGNOSIS — G250 Essential tremor: Secondary | ICD-10-CM

## 2019-02-11 DIAGNOSIS — N400 Enlarged prostate without lower urinary tract symptoms: Secondary | ICD-10-CM | POA: Diagnosis not present

## 2019-02-11 DIAGNOSIS — R7303 Prediabetes: Secondary | ICD-10-CM | POA: Diagnosis not present

## 2019-02-11 DIAGNOSIS — I10 Essential (primary) hypertension: Secondary | ICD-10-CM

## 2019-02-11 NOTE — Assessment & Plan Note (Signed)
BP Readings from Last 3 Encounters:  02/11/19 (!) 146/70  08/03/18 132/74  05/28/18 116/62   BP well controlled Current regimen effective and well tolerated Continue current medications at current doses cmp

## 2019-02-11 NOTE — Assessment & Plan Note (Signed)
Following with neurology Overall controlled Continue current medications

## 2019-02-11 NOTE — Assessment & Plan Note (Signed)
Check a1c Low sugar / carb diet Stressed regular exercise   

## 2019-02-11 NOTE — Assessment & Plan Note (Signed)
Symptoms well controlled/tolerable Concerned about finasteride causing cognitive decline Will continue flomax and proscar

## 2019-02-20 ENCOUNTER — Other Ambulatory Visit: Payer: Self-pay | Admitting: Neurology

## 2019-02-22 ENCOUNTER — Other Ambulatory Visit: Payer: Self-pay

## 2019-02-22 MED ORDER — PRIMIDONE 50 MG PO TABS: 50.0000 mg | ORAL_TABLET | Freq: Every day | ORAL | 1 refills | Status: DC

## 2019-02-22 NOTE — Telephone Encounter (Signed)
Requested Prescriptions   Pending Prescriptions Disp Refills  . primidone (MYSOLINE) 50 MG tablet 90 tablet 1    Sig: Take 1 tablet (50 mg total) by mouth at bedtime.   Rx last filled: 11/06/18 #90 0 refills  Pt last seen:05/28/18  Follow up appt scheduled:04/05/19  sent

## 2019-02-26 NOTE — Telephone Encounter (Signed)
Requested Prescriptions   Pending Prescriptions Disp Refills  . primidone (MYSOLINE) 50 MG tablet [Pharmacy Med Name: Primidone 50 MG Oral Tablet] 90 tablet 0    Sig: TAKE 1 TABLET BY MOUTH AT BEDTIME   Rx last filled:02/22/19 #90 1 REFILLS  Pt last seen:05/28/18  Follow up appt scheduled:04/05/19  DENIED REQUEST DUPLICATE

## 2019-04-04 NOTE — Progress Notes (Signed)
Virtual Visit via Video Note The purpose of this virtual visit is to provide medical care while limiting exposure to the novel coronavirus.    Consent was obtained for video visit:  Yes.   Answered questions that patient had about telehealth interaction:  Yes.   I discussed the limitations, risks, security and privacy concerns of performing an evaluation and management service by telemedicine. I also discussed with the patient that there may be a patient responsible charge related to this service. The patient expressed understanding and agreed to proceed.  Pt location: Home Physician Location: home Name of referring provider:  Binnie Rail, MD I connected with Roger Ingram at patients initiation/request on 04/05/2019 at  2:30 PM EDT by video enabled telemedicine application and verified that I am speaking with the correct person using two identifiers. Pt MRN:  [id number] Pt DOB:  02/08/1929 Video Participants:  Roger Ingram;     History of Present Illness:  Patient seen today in follow-up for essential tremor.  He is on propranolol, 10 mg, 1 to 2 tablets/day (usually one).  He is also on primidone, 50 mg at night.  Patient states that his tremor is stable.  Coffee makes it worse and "if I forget my medication it is worse."  He has had no falls since last visit.  No lightheadedness or near syncope.  His primary care records have been reviewed since our last visit.   Current Outpatient Medications on File Prior to Visit  Medication Sig Dispense Refill  . amLODipine-benazepril (LOTREL) 5-20 MG capsule TAKE 1 CAPSULE BY MOUTH ONCE DAILY WITH SUPPER 90 capsule 0  . aspirin EC 81 MG tablet Take 81 mg by mouth daily.    . finasteride (PROSCAR) 5 MG tablet TAKE 1 TABLET BY MOUTH ONCE DAILY 90 tablet 1  . folic acid (FOLVITE) 283 MCG tablet Take 400 mcg by mouth daily.    . primidone (MYSOLINE) 50 MG tablet Take 1 tablet (50 mg total) by mouth at bedtime. 90 tablet 1  . propranolol (INDERAL)  10 MG tablet TAKE 1 TABLET BY MOUTH TWICE DAILY FOR  ESSENTIAL  TREMOR 180 tablet 1  . tamsulosin (FLOMAX) 0.4 MG CAPS capsule Take 1 capsule by mouth once daily 90 capsule 0   No current facility-administered medications on file prior to visit.      Observations/Objective:   Vitals:   04/05/19 0817  Weight: 145 lb (65.8 kg)  Height: 5\' 8"  (1.727 m)   GEN:  The patient appears stated age and is in NAD.  Neurological examination:  Orientation: The patient is alert and oriented x3. Cranial nerves: There is good facial symmetry. There is no facial hypomimia.  The speech is fluent and clear. Soft palate rises symmetrically and there is no tongue deviation. Hearing is intact to conversational tone. Motor: Strength is at least antigravity x 4.   Shoulder shrug is equal and symmetric.  There is no pronator drift.  Movement examination: Tone: unable Abnormal movements: There is postural tremor and intention tremor, much more so in the right upper extremity than the left. Coordination:  There is no decremation with RAM's, with hand opening and closing her finger taps bilaterally.  He has intention tremor on the right especially. Gait and Station: not tested    Chemistry      Component Value Date/Time   NA 135 08/03/2018 1145   K 5.1 08/03/2018 1145   CL 102 08/03/2018 1145   CO2 27 08/03/2018 1145  BUN 24 (H) 08/03/2018 1145   CREATININE 1.17 08/03/2018 1145      Component Value Date/Time   CALCIUM 9.4 08/03/2018 1145   ALKPHOS 58 08/03/2018 1145   AST 15 08/03/2018 1145   ALT 15 08/03/2018 1145   BILITOT 0.4 08/03/2018 1145     Lab Results  Component Value Date   TSH 3.27 08/03/2018     Assessment and Plan:   1.  Essential tremor  -Continue propranolol, 10 mg, 1 to 2 tablets/day.  Most days he only takes 1 tablet.  Understands that this medication likely contributes to bradycardia.  He is asymptomatic.  He has had low heart rate for many years.  -Continue primidone, 50  mg daily.  Discussed increasing it again and he asked about risks and benefits and we discussed this.  Ultimately, he stated that he would like to continue with the primidone, 50 mg daily.  Follow Up Instructions:  8 months.  Pt would like to stay on the virtual platform, if able.     -I discussed the assessment and treatment plan with the patient. The patient was provided an opportunity to ask questions and all were answered. The patient agreed with the plan and demonstrated an understanding of the instructions.   The patient was advised to call back or seek an in-person evaluation if the symptoms worsen or if the condition fails to improve as anticipated.    Total Time spent in visit with the patient was:  15 min, of which more than 50% of the time was spent in counseling.   Pt understands and agrees with the plan of care outlined.     Alonza Bogus, DO

## 2019-04-05 ENCOUNTER — Other Ambulatory Visit: Payer: Self-pay

## 2019-04-05 ENCOUNTER — Telehealth (INDEPENDENT_AMBULATORY_CARE_PROVIDER_SITE_OTHER): Payer: Medicare Other | Admitting: Neurology

## 2019-04-05 ENCOUNTER — Encounter: Payer: Self-pay | Admitting: Neurology

## 2019-04-05 VITALS — Ht 68.0 in | Wt 145.0 lb

## 2019-04-05 DIAGNOSIS — G250 Essential tremor: Secondary | ICD-10-CM

## 2019-04-16 DIAGNOSIS — D485 Neoplasm of uncertain behavior of skin: Secondary | ICD-10-CM | POA: Diagnosis not present

## 2019-04-16 DIAGNOSIS — Z85828 Personal history of other malignant neoplasm of skin: Secondary | ICD-10-CM | POA: Diagnosis not present

## 2019-04-16 DIAGNOSIS — C44319 Basal cell carcinoma of skin of other parts of face: Secondary | ICD-10-CM | POA: Diagnosis not present

## 2019-04-16 DIAGNOSIS — L821 Other seborrheic keratosis: Secondary | ICD-10-CM | POA: Diagnosis not present

## 2019-04-16 DIAGNOSIS — L820 Inflamed seborrheic keratosis: Secondary | ICD-10-CM | POA: Diagnosis not present

## 2019-04-16 DIAGNOSIS — D1801 Hemangioma of skin and subcutaneous tissue: Secondary | ICD-10-CM | POA: Diagnosis not present

## 2019-04-16 DIAGNOSIS — L814 Other melanin hyperpigmentation: Secondary | ICD-10-CM | POA: Diagnosis not present

## 2019-04-22 ENCOUNTER — Other Ambulatory Visit: Payer: Self-pay | Admitting: Internal Medicine

## 2019-04-30 ENCOUNTER — Other Ambulatory Visit: Payer: Self-pay | Admitting: Internal Medicine

## 2019-05-02 ENCOUNTER — Other Ambulatory Visit: Payer: Self-pay | Admitting: Internal Medicine

## 2019-05-23 ENCOUNTER — Ambulatory Visit: Payer: Self-pay | Admitting: *Deleted

## 2019-05-23 NOTE — Progress Notes (Signed)
Subjective:    Patient ID: Roger Ingram, male    DOB: December 06, 1928, 85 y.o.   MRN: [id number]  HPI The patient is here for an acute visit.   Dizziness:  The dizziness feels like fogginess, feeling off balanced - there is no spinning sensation.  It started about one week ago.  He noticed it when he got out of bed.  He has had it daily.  He gets dizziness when he lays down or gets out of bed.  Headaches on left side - ache, occasional sharp pains - not new - this has been going on for a while.  It has been a little worse recently.  He has occasional dizziness throughout the day.  Sometimes when he turns his head to the right it may cause some dizziness. A few days ago he was looking up and got dizzy.  It is related to head movements.  One time he had a little nausea. Occasionally his balance is off when he is dizzy.   The dizziness has gotten a little better since it started.    His mom, dad and two brothers had stroke.      Medications and allergies reviewed with patient and updated if appropriate.  Patient Active Problem List   Diagnosis Date Noted  . Other peripheral vertigo, unspecified ear 05/24/2019  . Chronic left SI joint pain 08/03/2018  . Diverticulitis large intestine 02/08/2018  . Dark stools 02/08/2018  . Abdominal pain 01/31/2018  . Osteoarthritis of left hip 12/19/2014  . Status post total replacement of left hip 12/19/2014  . Degenerative joint disease (DJD) of hip 11/13/2014  . Popliteal cyst, unruptured 06/17/2014  . DDD (degenerative disc disease), lumbosacral 10/26/2013  . NOCTURIA 12/28/2009  . Essential hypertension 07/06/2009  . Prediabetes 10/23/2008  . Hyperlipidemia 10/02/2008  . Essential tremor 10/02/2008  . Diverticulosis of large intestine 10/02/2008  . NEURITIS 10/02/2008  . MALIGNANT MELANOMA, HX OF 10/02/2008  . SKIN CANCER, HX OF 10/02/2008  . History of colonic polyps 10/02/2008  . BPH (benign prostatic hyperplasia) 06/02/2008    Current  Outpatient Medications on File Prior to Visit  Medication Sig Dispense Refill  . amLODipine-benazepril (LOTREL) 5-20 MG capsule TAKE 1 CAPSULE BY MOUTH ONCE DAILY WITH SUPPER 90 capsule 0  . aspirin EC 81 MG tablet Take 81 mg by mouth daily.    . finasteride (PROSCAR) 5 MG tablet TAKE 1 TABLET BY MOUTH ONCE DAILY 90 tablet 1  . folic acid (FOLVITE) A999333 MCG tablet Take 400 mcg by mouth daily.    . primidone (MYSOLINE) 50 MG tablet Take 1 tablet (50 mg total) by mouth at bedtime. 90 tablet 1  . propranolol (INDERAL) 10 MG tablet TAKE 1 TABLET BY MOUTH TWICE DAILY FOR ESSENTIAL TREMOR 180 tablet 0  . tamsulosin (FLOMAX) 0.4 MG CAPS capsule Take 1 capsule by mouth once daily 90 capsule 0   No current facility-administered medications on file prior to visit.     Past Medical History:  Diagnosis Date  . Basal cell cancer   . BPH (benign prostatic hypertrophy)    Dr Junious Silk  . Chronic kidney disease    Hx of clear fluid sacs on both kidneys. Drained about 2006  . Diverticulosis of colon   . Elevated PSA    [facility]  . History of kidney stones   . Hx of colonic polyps   . Hyperlipidemia   . Hypertension   . Melanoma ([place])  Past Surgical History:  Procedure Laterality Date  . basal cell     R  & L temple; Dr Allyson Sabal  . COLONOSCOPY W/ POLYPECTOMY  1997, 2011   Dr Earlean Shawl  . melanoma     L forehead  . Right rotator cuff sx    . TOTAL HIP ARTHROPLASTY Left 12/19/2014   Procedure: LEFT TOTAL HIP ARTHROPLASTY ANTERIOR APPROACH;  Surgeon: Mcarthur Rossetti, MD;  Location: [facility];  Service: Orthopedics;  Laterality: Left;    Social History   Socioeconomic History  . Marital status: Married    Spouse name: Not on file  . Number of children: 3  . Years of education: Not on file  . Highest education level: Professional school degree (e.g., MD, DDS, DVM, JD)  Occupational History  . Occupation: retired    Comment: Sports coach  . Financial resource strain: Not  on file  . Food insecurity    Worry: Not on file    Inability: Not on file  . Transportation needs    Medical: Not on file    Non-medical: Not on file  Tobacco Use  . Smoking status: Never Smoker  . Smokeless tobacco: Never Used  Substance and Sexual Activity  . Alcohol use: No    Alcohol/week: 0.0 standard drinks  . Drug use: No  . Sexual activity: Not on file  Lifestyle  . Physical activity    Days per week: Not on file    Minutes per session: Not on file  . Stress: Not on file  Relationships  . Social Herbalist on phone: Not on file    Gets together: Not on file    Attends religious service: Not on file    Active member of club or organization: Not on file    Attends meetings of clubs or organizations: Not on file    Relationship status: Not on file  Other Topics Concern  . Not on file  Social History Narrative   Walks twice daily for exercise   Pt lives at home with spouse 1 story home- he has 3 children 2 which are living   Pt can use both hands to write    Family History  Problem Relation Age of Onset  . Colon cancer Brother 63  . Other Sister        addisons disease  . Dementia Sister   . Stroke Brother   . Stroke Mother 22  . Heart attack Father 29  . Stroke Father   . Stroke Paternal Grandfather        in 34s  . Diabetes Son        IDDM  . Crohn's disease Daughter   . Drug abuse Son     Review of Systems  Constitutional: Negative for chills and fever.  HENT: Negative for congestion, ear pain, sinus pain and sore throat.   Respiratory: Negative for cough, shortness of breath and wheezing.   Neurological: Positive for dizziness and headaches. Negative for weakness and numbness.       Objective:   Vitals:   05/24/19 0941  BP: 138/68  Pulse: 64  Resp: 16  Temp: 98.7 F (37.1 C)  SpO2: 97%   BP Readings from Last 3 Encounters:  05/24/19 138/68  02/11/19 (!) 146/70  08/03/18 132/74   Wt Readings from Last 3 Encounters:   05/24/19 144 lb (65.3 kg)  04/05/19 145 lb (65.8 kg)  02/11/19 146 lb (66.2 kg)  Body mass index is 21.9 kg/m.   Physical Exam Constitutional:      General: He is not in acute distress.    Appearance: Normal appearance. He is not ill-appearing.  HENT:     Head: Normocephalic and atraumatic.     Right Ear: Tympanic membrane, ear canal and external ear normal. There is no impacted cerumen.     Left Ear: Tympanic membrane, ear canal and external ear normal. There is no impacted cerumen.     Nose: Nose normal.     Mouth/Throat:     Mouth: Mucous membranes are moist.     Pharynx: No posterior oropharyngeal erythema.  Eyes:     Extraocular Movements: Extraocular movements intact.     Conjunctiva/sclera: Conjunctivae normal.  Neck:     Musculoskeletal: Neck supple. No muscular tenderness.     Vascular: No carotid bruit.  Cardiovascular:     Rate and Rhythm: Normal rate and regular rhythm.  Pulmonary:     Effort: Pulmonary effort is normal. No respiratory distress.     Breath sounds: Normal breath sounds. No wheezing or rales.  Musculoskeletal:     Right lower leg: No edema.     Left lower leg: No edema.  Lymphadenopathy:     Cervical: No cervical adenopathy.  Skin:    General: Skin is warm and dry.  Neurological:     General: No focal deficit present.     Mental Status: He is alert and oriented to person, place, and time.     Cranial Nerves: No cranial nerve deficit.     Sensory: No sensory deficit.     Motor: No weakness.  Psychiatric:        Mood and Affect: Mood normal.            Assessment & Plan:    See Problem List for Assessment and Plan of chronic medical problems.

## 2019-05-23 NOTE — Telephone Encounter (Signed)
Pt called in c/o being dizzy on and off over the last week.  It happens on and off during the day but every time I lay down in bed at night I feel dizzy.   Denies N/V/D or being sick recently.    See triage notes  I warm transferred the call into the office to [place] to be scheduled.  I sent my notes to Dr. Quay Burow' office.   Reason for Disposition . [1] MODERATE dizziness (e.g., interferes with normal activities) AND [2] has NOT been evaluated by physician for this  (Exception: dizziness caused by heat exposure, sudden standing, or poor fluid intake)  Answer Assessment - Initial Assessment Questions 1. DESCRIPTION: "Describe your dizziness."     I having dizziness mostly at night but occasionally in the day time too.   When I move my head it bothers it.    I get a headache on occasion.  The dizziness is every day for a wee but not all day. 2. LIGHTHEADED: "Do you feel lightheaded?" (e.g., somewhat faint, woozy, weak upon standing)     If I'm standing I mght stagger one way or the other.   When I go to bed  I have a dizzy spell.    I'm a little dizzy when I get up during the night to go to the bathroom.   I have to hold onto something to help me not fall.   3. VERTIGO: "Do you feel like either you or the room is spinning or tilting?" (i.e. vertigo)     No 4. SEVERITY: "How bad is it?"  "Do you feel like you are going to faint?" "Can you stand and walk?"   - MILD - walking normally   - MODERATE - interferes with normal activities (e.g., work, school)    - SEVERE - unable to stand, requires support to walk, feels like passing out now.      Mild    5. ONSET:  "When did the dizziness begin?"     A week ago on and off daily 6. AGGRAVATING FACTORS: "Does anything make it worse?" (e.g., standing, change in head position)     Going to bed when I lay down I get dizzy.   7. HEART RATE: "Can you tell me your heart rate?" "How many beats in 15 seconds?"  (Note: not all patients can do this)    Not asked 8. CAUSE: "What do you think is causing the dizziness?"     Maybe a ear problem. 9. RECURRENT SYMPTOM: "Have you had dizziness before?" If so, ask: "When was the last time?" "What happened that time?"     No 10. OTHER SYMPTOMS: "Do you have any other symptoms?" (e.g., fever, chest pain, vomiting, diarrhea, bleeding)       No nausea, vomiting or diarrhea. 11. PREGNANCY: "Is there any chance you are pregnant?" "When was your last menstrual period?"       N/A  Protocols used: DIZZINESS [facility]

## 2019-05-23 NOTE — Telephone Encounter (Signed)
Appointment has been made for tomorrow with Dr.Burns 10/09.

## 2019-05-24 ENCOUNTER — Encounter: Payer: Self-pay | Admitting: Internal Medicine

## 2019-05-24 ENCOUNTER — Ambulatory Visit (INDEPENDENT_AMBULATORY_CARE_PROVIDER_SITE_OTHER): Payer: Medicare Other | Admitting: Internal Medicine

## 2019-05-24 ENCOUNTER — Other Ambulatory Visit: Payer: Self-pay

## 2019-05-24 DIAGNOSIS — H81399 Other peripheral vertigo, unspecified ear: Secondary | ICD-10-CM | POA: Insufficient documentation

## 2019-05-24 MED ORDER — MECLIZINE HCL 12.5 MG PO TABS: 12.5000 mg | ORAL_TABLET | Freq: Three times a day (TID) | ORAL | 0 refills | Status: DC | PRN

## 2019-05-24 NOTE — Assessment & Plan Note (Signed)
Likely BPPV given that it is related to head movements He is having left sided headaches and has a very strong history of CVA so we will go ahead and get an MRI of his head Meclizine 12.5-25 mg TID prn Deferred PT at this time  He will call if symptoms do not improve/worsen

## 2019-05-24 NOTE — Patient Instructions (Addendum)
Try meclizine as needed for the dizziness.  An MRI was ordered.     Please call if there is no improvement in your symptoms.     Benign Positional Vertigo Vertigo is the feeling that you or your surroundings are moving when they are not. Benign positional vertigo is the most common form of vertigo. This is usually a harmless condition (benign). This condition is positional. This means that symptoms are triggered by certain movements and positions. This condition can be dangerous if it occurs while you are doing something that could cause harm to you or others. This includes activities such as driving or operating machinery. What are the causes? In many cases, the cause of this condition is not known. It may be caused by a disturbance in an area of the inner ear that helps your brain to sense movement and balance. This disturbance can be caused by:  Viral infection (labyrinthitis).  Head injury.  Repetitive motion, such as jumping, dancing, or running. What increases the risk? You are more likely to develop this condition if:  You are a woman.  You are 102 years of age or older. What are the signs or symptoms? Symptoms of this condition usually happen when you move your head or your eyes in different directions. Symptoms may start suddenly, and usually last for less than a minute. They include:  Loss of balance and falling.  Feeling like you are spinning or moving.  Feeling like your surroundings are spinning or moving.  Nausea and vomiting.  Blurred vision.  Dizziness.  Involuntary eye movement (nystagmus). Symptoms can be mild and cause only minor problems, or they can be severe and interfere with daily life. Episodes of benign positional vertigo may return (recur) over time. Symptoms may improve over time. How is this diagnosed? This condition may be diagnosed based on:  Your medical history.  Physical exam of the head, neck, and ears.  Tests, such as: ? MRI. ? CT  scan. ? Eye movement tests. Your health care provider may ask you to change positions quickly while he or she watches you for symptoms of benign positional vertigo, such as nystagmus. Eye movement may be tested with a variety of exams that are designed to evaluate or stimulate vertigo. ? An electroencephalogram (EEG). This records electrical activity in your brain. ? Hearing tests. You may be referred to a health care provider who specializes in ear, nose, and throat (ENT) problems (otolaryngologist) or a provider who specializes in disorders of the nervous system (neurologist). How is this treated?  This condition may be treated in a session in which your health care provider moves your head in specific positions to adjust your inner ear back to normal. Treatment for this condition may take several sessions. Surgery may be needed in severe cases, but this is rare. In some cases, benign positional vertigo may resolve on its own in 2-4 weeks. Follow these instructions at home: Safety  Move slowly. Avoid sudden body or head movements or certain positions, as told by your health care provider.  Avoid driving until your health care provider says it is safe for you to do so.  Avoid operating heavy machinery until your health care provider says it is safe for you to do so.  Avoid doing any tasks that would be dangerous to you or others if vertigo occurs.  If you have trouble walking or keeping your balance, try using a cane for stability. If you feel dizzy or unstable, sit down right  away.  Return to your normal activities as told by your health care provider. Ask your health care provider what activities are safe for you. General instructions  Take over-the-counter and prescription medicines only as told by your health care provider.  Drink enough fluid to keep your urine pale yellow.  Keep all follow-up visits as told by your health care provider. This is important. Contact a health care  provider if:  You have a fever.  Your condition gets worse or you develop new symptoms.  Your family or friends notice any behavioral changes.  You have nausea or vomiting that gets worse.  You have numbness or a "pins and needles" sensation. Get help right away if you:  Have difficulty speaking or moving.  Are always dizzy.  Faint.  Develop severe headaches.  Have weakness in your legs or arms.  Have changes in your hearing or vision.  Develop a stiff neck.  Develop sensitivity to light. Summary  Vertigo is the feeling that you or your surroundings are moving when they are not. Benign positional vertigo is the most common form of vertigo.  The cause of this condition is not known. It may be caused by a disturbance in an area of the inner ear that helps your brain to sense movement and balance.  Symptoms include loss of balance and falling, feeling that you or your surroundings are moving, nausea and vomiting, and blurred vision.  This condition can be diagnosed based on symptoms, physical exam, and other tests, such as MRI, CT scan, eye movement tests, and hearing tests.  Follow safety instructions as told by your health care provider. You will also be told when to contact your health care provider in case of problems. This information is not intended to replace advice given to you by your health care provider. Make sure you discuss any questions you have with your health care provider. Document Released: 05/09/2006 Document Revised: 01/10/2018 Document Reviewed: 01/10/2018 Elsevier Patient Education  2020 [employer].

## 2019-05-29 ENCOUNTER — Ambulatory Visit
Admission: RE | Admit: 2019-05-29 | Discharge: 2019-05-29 | Disposition: A | Discharge status: Home / Self Care | Payer: Medicare Other | Source: Ambulatory Visit | Attending: Internal Medicine | Admitting: Internal Medicine

## 2019-05-29 DIAGNOSIS — H81399 Other peripheral vertigo, unspecified ear: Secondary | ICD-10-CM

## 2019-05-29 DIAGNOSIS — R42 Dizziness and giddiness: Secondary | ICD-10-CM | POA: Diagnosis not present

## 2019-06-04 ENCOUNTER — Other Ambulatory Visit: Payer: Self-pay

## 2019-06-04 MED ORDER — MECLIZINE HCL 12.5 MG PO TABS: 12.5000 mg | ORAL_TABLET | Freq: Three times a day (TID) | ORAL | 0 refills | Status: DC | PRN

## 2019-07-19 ENCOUNTER — Telehealth: Payer: Self-pay | Admitting: Internal Medicine

## 2019-07-23 ENCOUNTER — Other Ambulatory Visit: Payer: Self-pay | Admitting: Internal Medicine

## 2019-07-27 ENCOUNTER — Other Ambulatory Visit: Payer: Self-pay | Admitting: Internal Medicine

## 2019-07-31 MED ORDER — AMLODIPINE BESY-BENAZEPRIL HCL 5-20 MG PO CAPS: ORAL_CAPSULE | ORAL | 0 refills | Status: DC

## 2019-07-31 NOTE — Addendum Note (Signed)
Addended by: Delice Bison E on: 07/31/2019 11:17 AM   Modules accepted: Orders

## 2019-07-31 NOTE — Telephone Encounter (Signed)
Short supply sent to local pharmacy. Pt aware.

## 2019-07-31 NOTE — Telephone Encounter (Signed)
Pt states that this medication has a tracking number but the post office can't find it and he is now out of medication.  Pt needs to know what to do to have a short supply called in for him while the post office tries to find it. Pt uses  [facility] [place] [facility], [place] Phone:  [phone]  Fax:  [phone]

## 2019-08-02 ENCOUNTER — Telehealth: Payer: Self-pay | Admitting: Internal Medicine

## 2019-08-02 NOTE — Telephone Encounter (Signed)
Patient is calling for advice on rescheduling his annual appt on 08/06/2019. Patient has a slight possibility that he had an exposure with COVID. Patient was encouraged to reschedule his appt. Patient stated that he would prefer to talk to Dr. Quay Burow nurse. Please advise  [phone]

## 2019-08-02 NOTE — Telephone Encounter (Signed)
Should reschedule

## 2019-08-05 NOTE — Telephone Encounter (Signed)
Appointment rescheduled.

## 2019-08-06 ENCOUNTER — Ambulatory Visit: Payer: Medicare Other | Admitting: Internal Medicine

## 2019-08-19 ENCOUNTER — Other Ambulatory Visit: Payer: Self-pay | Admitting: Internal Medicine

## 2019-08-20 IMAGING — DX DG ABDOMEN 2V
2 series · 2 of 2 positions shown · non-contrast
Comparison: 04/24/2017.

CLINICAL DATA: Right abdominal pain.  Nausea.  Symptoms for 3 days.

EXAM:
ABDOMEN - 2 VIEW

[abdomen erect]
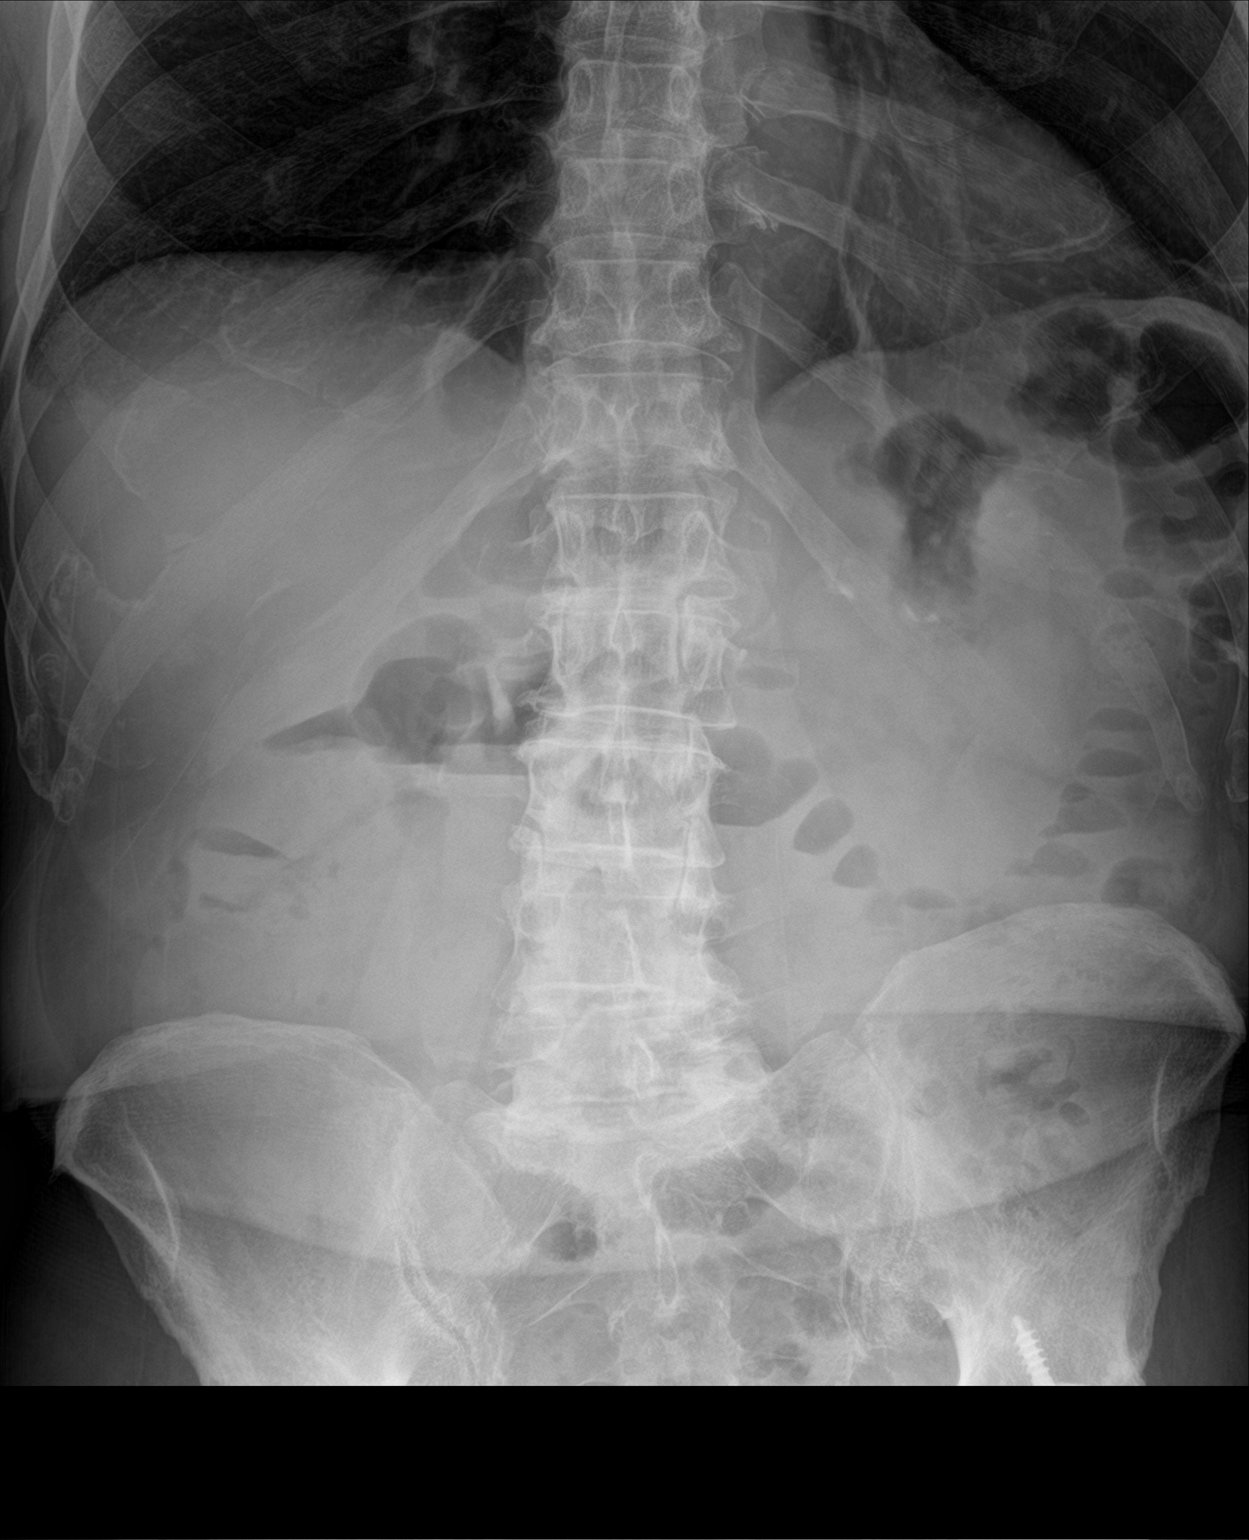

[abdomen supine]
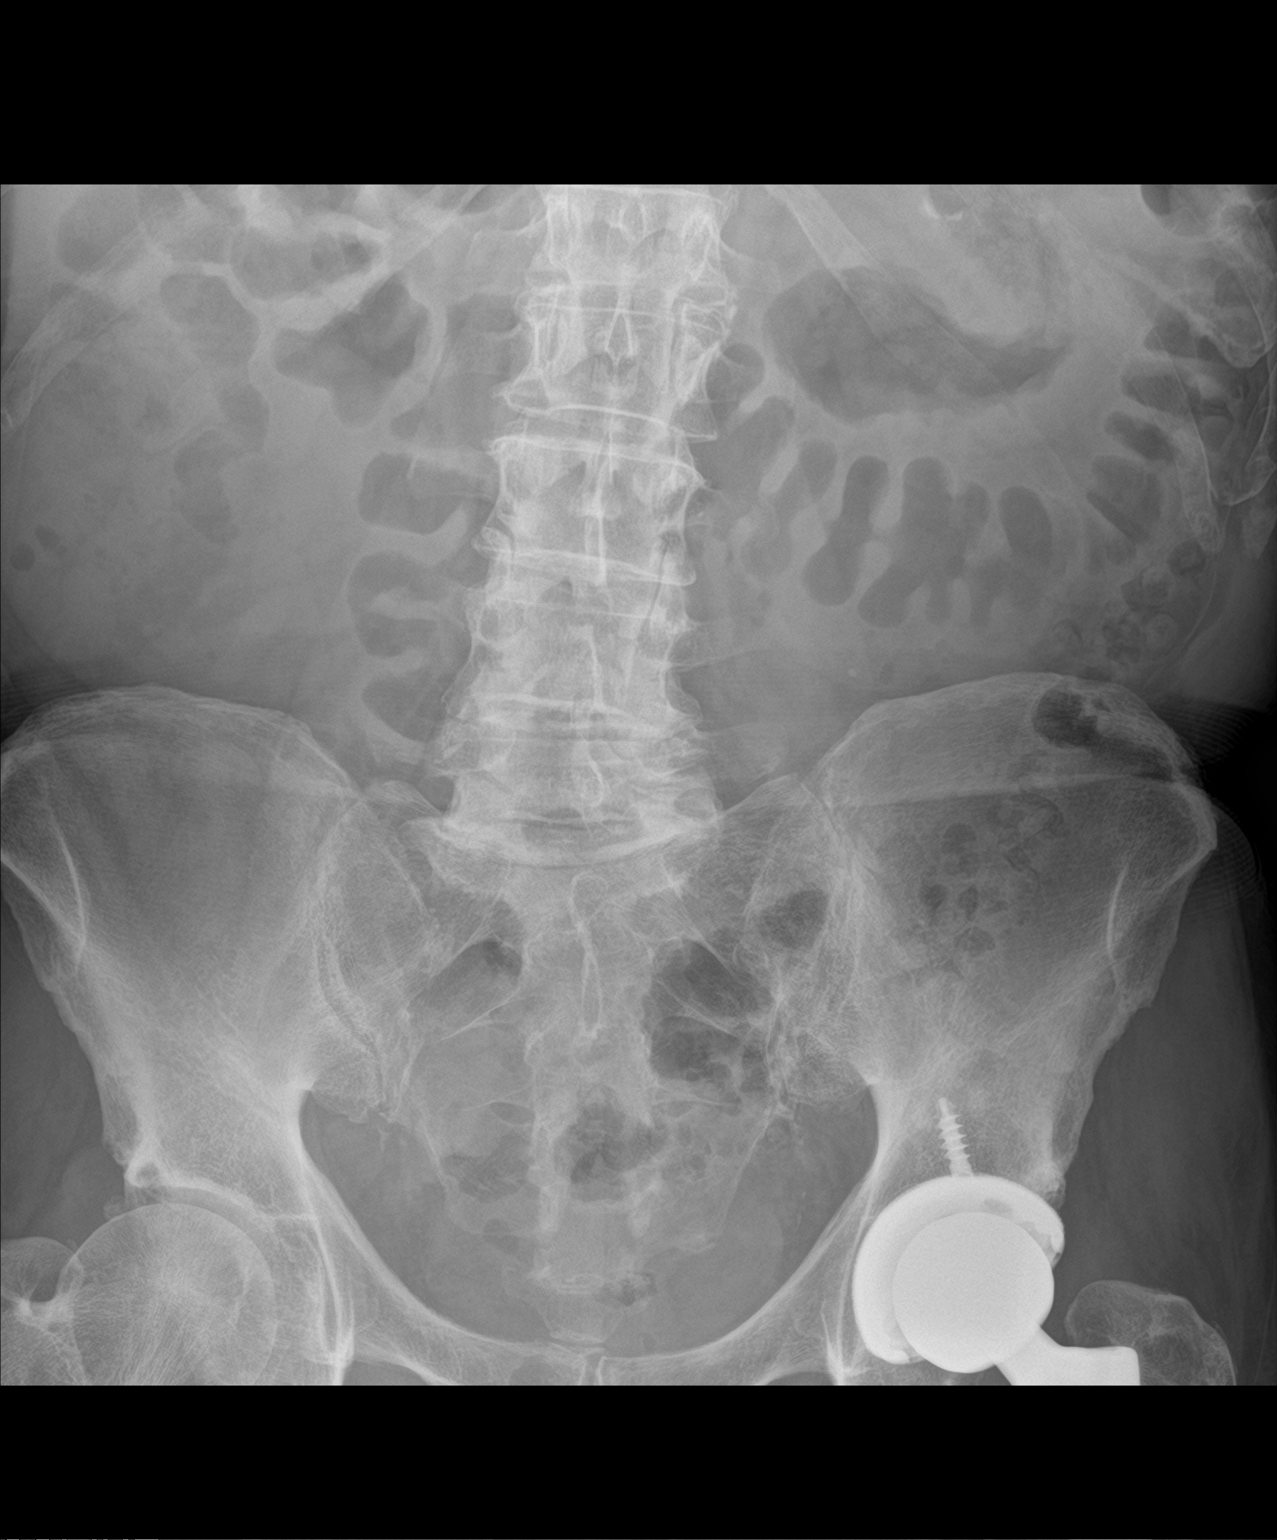

[2 of 2 positions shown; findings below may reference images not displayed]

FINDINGS: There is no bowel dilation, but there are air-fluid levels which
project predominantly within the colon on the erect view. There is
no free air.

Small round density lies to the left of the L4 vertebra, not evident
on the prior exam. This could reflect a ureteral stone, but its
shape suggests a small phlebolith. There are splenic, aortic and
iliac artery vascular calcifications. The soft tissues are otherwise
unremarkable.

No acute skeletal abnormality.
IMPRESSION: 1. No evidence of bowel obstruction or free air.
2. Nonspecific colonic air-fluid levels on the erect view. This
suggests a mild adynamic ileus.
3. Small calculus lies to the left of the L4 vertebra. This does not
correlate with right abdominal pain and is most likely a phlebolith.

## 2019-09-08 NOTE — Assessment & Plan Note (Addendum)
Chronic Lifestyle controlled, continue regular exercise Lipids, tsh, cmp

## 2019-09-08 NOTE — Progress Notes (Signed)
Subjective:    Patient ID: Roger Ingram, male    DOB: 01-30-1929, 86 y.o.   MRN: [id number]  HPI The patient is here for follow up of their chronic medical problems, including hypertension, prediabetes, essential tremor, BPH,    He is taking all of his medications as prescribed.    He is exercising regularly.   He walks twice a day.    Overall he feels good and has no concerns.  His blood pressure is elevated here today, but that is unusual for him.  He did get lost trying to find the building and he thinks that is probably the cause.  Medications and allergies reviewed with patient and updated if appropriate.  Patient Active Problem List   Diagnosis Date Noted  . Other peripheral vertigo, unspecified ear 05/24/2019  . Chronic left SI joint pain 08/03/2018  . Diverticulitis large intestine 02/08/2018  . Dark stools 02/08/2018  . Abdominal pain 01/31/2018  . Osteoarthritis of left hip 12/19/2014  . Status post total replacement of left hip 12/19/2014  . Degenerative joint disease (DJD) of hip 11/13/2014  . Popliteal cyst, unruptured 06/17/2014  . DDD (degenerative disc disease), lumbosacral 10/26/2013  . NOCTURIA 12/28/2009  . Essential hypertension 07/06/2009  . Prediabetes 10/23/2008  . Hyperlipidemia 10/02/2008  . Essential tremor 10/02/2008  . Diverticulosis of large intestine 10/02/2008  . NEURITIS 10/02/2008  . MALIGNANT MELANOMA, HX OF 10/02/2008  . SKIN CANCER, HX OF 10/02/2008  . History of colonic polyps 10/02/2008  . BPH (benign prostatic hyperplasia) 06/02/2008    Current Outpatient Medications on File Prior to Visit  Medication Sig Dispense Refill  . amLODipine-benazepril (LOTREL) 5-20 MG capsule TAKE 1 CAPSULE BY MOUTH ONCE DAILY WITH SUPPER 30 capsule 0  . aspirin EC 81 MG tablet Take 81 mg by mouth daily.    . finasteride (PROSCAR) 5 MG tablet Take 1 tablet by mouth once daily 90 tablet 0  . folic acid (FOLVITE) A999333 MCG tablet Take 400 mcg by mouth  daily.    . meclizine (ANTIVERT) 12.5 MG tablet Take 1-2 tablets (12.5-25 mg total) by mouth 3 (three) times daily as needed for dizziness. 40 tablet 0  . primidone (MYSOLINE) 50 MG tablet Take 1 tablet (50 mg total) by mouth at bedtime. 90 tablet 1  . propranolol (INDERAL) 10 MG tablet TAKE 1 TABLET BY MOUTH TWICE DAILY FOR ESSENTIAL TREMOR 180 tablet 0  . tamsulosin (FLOMAX) 0.4 MG CAPS capsule Take 1 capsule by mouth once daily 90 capsule 0   No current facility-administered medications on file prior to visit.    Past Medical History:  Diagnosis Date  . Basal cell cancer   . BPH (benign prostatic hypertrophy)    Dr Junious Silk  . Chronic kidney disease    Hx of clear fluid sacs on both kidneys. Drained about 2006  . Diverticulosis of colon   . Elevated PSA    [facility]  . History of kidney stones   . Hx of colonic polyps   . Hyperlipidemia   . Hypertension   . Melanoma [facility])     Past Surgical History:  Procedure Laterality Date  . basal cell     R  & L temple; Dr Allyson Sabal  . COLONOSCOPY W/ POLYPECTOMY  1997, 2011   Dr Earlean Shawl  . melanoma     L forehead  . Right rotator cuff sx    . TOTAL HIP ARTHROPLASTY Left 12/19/2014   Procedure: LEFT TOTAL HIP ARTHROPLASTY  ANTERIOR APPROACH;  Surgeon: Mcarthur Rossetti, MD;  Location: [facility];  Service: Orthopedics;  Laterality: Left;    Social History   Socioeconomic History  . Marital status: Married    Spouse name: Not on file  . Number of children: 3  . Years of education: Not on file  . Highest education level: Professional school degree (e.g., MD, DDS, DVM, JD)  Occupational History  . Occupation: retired    Comment: Insurance underwriter  Tobacco Use  . Smoking status: Never Smoker  . Smokeless tobacco: Never Used  Substance and Sexual Activity  . Alcohol use: No    Alcohol/week: 0.0 standard drinks  . Drug use: No  . Sexual activity: Not on file  Other Topics Concern  . Not on file  Social History Narrative   Walks twice  daily for exercise   Pt lives at home with spouse 1 story home- he has 3 children 2 which are living   Pt can use both hands to write   Social Determinants of Health   Financial Resource Strain:   . Difficulty of Paying Living Expenses: Not on file  Food Insecurity:   . Worried About Charity fundraiser in the Last Year: Not on file  . Ran Out of Food in the Last Year: Not on file  Transportation Needs:   . Lack of Transportation (Medical): Not on file  . Lack of Transportation (Non-Medical): Not on file  Physical Activity:   . Days of Exercise per Week: Not on file  . Minutes of Exercise per Session: Not on file  Stress:   . Feeling of Stress : Not on file  Social Connections:   . Frequency of Communication with Friends and Family: Not on file  . Frequency of Social Gatherings with Friends and Family: Not on file  . Attends Religious Services: Not on file  . Active Member of Clubs or Organizations: Not on file  . Attends Archivist Meetings: Not on file  . Marital Status: Not on file    Family History  Problem Relation Age of Onset  . Colon cancer Brother 44  . Other Sister        addisons disease  . Dementia Sister   . Stroke Brother   . Stroke Mother 13  . Heart attack Father 61  . Stroke Father   . Stroke Paternal Grandfather        in 64s  . Diabetes Son        IDDM  . Crohn's disease Daughter   . Drug abuse Son     Review of Systems  Constitutional: Negative for chills and fever.  Respiratory: Negative for cough, shortness of breath and wheezing.   Cardiovascular: Negative for chest pain, palpitations and leg swelling.  Gastrointestinal: Positive for abdominal pain (intermittent with diverticulosis). Negative for nausea.  Genitourinary: Positive for frequency.  Neurological: Negative for light-headedness and headaches.       Objective:   Vitals:   09/09/19 1400  BP: (!) 164/78  Pulse: 65  Resp: 16  Temp: 98.3 F (36.8 C)  SpO2: 99%    BP Readings from Last 3 Encounters:  09/09/19 (!) 164/78  05/24/19 138/68  02/11/19 (!) 146/70   Wt Readings from Last 3 Encounters:  09/09/19 148 lb (67.1 kg)  05/24/19 144 lb (65.3 kg)  04/05/19 145 lb (65.8 kg)   Body mass index is 22.5 kg/m.   Physical Exam    Constitutional: Appears well-developed and well-nourished.  No distress.  HENT:  Head: Normocephalic and atraumatic.  Neck: Neck supple. No tracheal deviation present. No thyromegaly present.  No cervical lymphadenopathy Cardiovascular: Normal rate, regular rhythm and normal heart sounds.   No murmur heard. No carotid bruit .  No edema Pulmonary/Chest: Effort normal and breath sounds normal. No respiratory distress. No has no wheezes. No rales.  Skin: Skin is warm and dry. Not diaphoretic.  Psychiatric: Normal mood and affect. Behavior is normal.      Assessment & Plan:    See Problem List for Assessment and Plan of chronic medical problems.    This visit occurred during the SARS-CoV-2 public health emergency.  Safety protocols were in place, including screening questions prior to the visit, additional usage of staff PPE, and extensive cleaning of exam room while observing appropriate contact time as indicated for disinfecting solutions.

## 2019-09-08 NOTE — Patient Instructions (Addendum)
  Blood work was ordered.    All other Health Maintenance issues reviewed.   All recommended immunizations and age-appropriate screenings are up-to-date or discussed.  No immunization administered today.   Medications reviewed and updated.  Changes include :   none    Please followup in 6 months

## 2019-09-09 ENCOUNTER — Encounter: Payer: Self-pay | Admitting: Internal Medicine

## 2019-09-09 ENCOUNTER — Ambulatory Visit (INDEPENDENT_AMBULATORY_CARE_PROVIDER_SITE_OTHER): Payer: Medicare Other | Admitting: Internal Medicine

## 2019-09-09 ENCOUNTER — Other Ambulatory Visit: Payer: Self-pay

## 2019-09-09 VITALS — BP 144/78 | HR 65 | Temp 98.3°F | Resp 16 | Ht 68.0 in | Wt 148.0 lb

## 2019-09-09 DIAGNOSIS — I10 Essential (primary) hypertension: Secondary | ICD-10-CM

## 2019-09-09 DIAGNOSIS — G250 Essential tremor: Secondary | ICD-10-CM | POA: Diagnosis not present

## 2019-09-09 DIAGNOSIS — E7849 Other hyperlipidemia: Secondary | ICD-10-CM | POA: Diagnosis not present

## 2019-09-09 DIAGNOSIS — R7303 Prediabetes: Secondary | ICD-10-CM

## 2019-09-09 DIAGNOSIS — N400 Enlarged prostate without lower urinary tract symptoms: Secondary | ICD-10-CM

## 2019-09-09 LAB — CBC WITH DIFFERENTIAL/PLATELET
Basophils Absolute: 0 10*3/uL (ref 0.0–0.1)
Basophils Relative: 0.5 % (ref 0.0–3.0)
Eosinophils Absolute: 0.2 10*3/uL (ref 0.0–0.7)
Eosinophils Relative: 2.4 % (ref 0.0–5.0)
HCT: 40.5 % (ref 39.0–52.0)
Hemoglobin: 13.3 g/dL (ref 13.0–17.0)
Lymphocytes Relative: 33.3 % (ref 12.0–46.0)
Lymphs Abs: 2.5 10*3/uL (ref 0.7–4.0)
MCHC: 32.8 g/dL (ref 30.0–36.0)
MCV: 91.6 fl (ref 78.0–100.0)
Monocytes Absolute: 0.6 10*3/uL (ref 0.1–1.0)
Monocytes Relative: 8.6 % (ref 3.0–12.0)
Neutro Abs: 4.1 10*3/uL (ref 1.4–7.7)
Neutrophils Relative %: 55.2 % (ref 43.0–77.0)
Platelets: 222 10*3/uL (ref 150.0–400.0)
RBC: 4.42 Mil/uL (ref 4.22–5.81)
RDW: 12.9 % (ref 11.5–15.5)
WBC: 7.4 10*3/uL (ref 4.0–10.5)

## 2019-09-09 LAB — LIPID PANEL
Cholesterol: 196 mg/dL (ref 0–200)
HDL: 68.9 mg/dL (ref >39.00)
LDL Cholesterol: 110 mg/dL — ABNORMAL HIGH (ref 0–99)
NonHDL: 126.94
Total CHOL/HDL Ratio: 3
Triglycerides: 83 mg/dL (ref 0.0–149.0)
VLDL: 16.6 mg/dL (ref 0.0–40.0)

## 2019-09-09 LAB — COMPREHENSIVE METABOLIC PANEL
ALT: 16 U/L (ref 0–53)
AST: 17 U/L (ref 0–37)
Albumin: 4.1 g/dL (ref 3.5–5.2)
Alkaline Phosphatase: 59 U/L (ref 39–117)
BUN: 27 mg/dL — ABNORMAL HIGH (ref 6–23)
CO2: 26 mEq/L (ref 19–32)
Calcium: 9.5 mg/dL (ref 8.4–10.5)
Chloride: 103 mEq/L (ref 96–112)
Creatinine, Ser: 1.25 mg/dL (ref 0.40–1.50)
GFR: 54.41 mL/min — ABNORMAL LOW (ref >60.00)
Glucose, Bld: 98 mg/dL (ref 70–99)
Potassium: 5.6 mEq/L — ABNORMAL HIGH (ref 3.5–5.1)
Sodium: 135 mEq/L (ref 135–145)
Total Bilirubin: 0.4 mg/dL (ref 0.2–1.2)
Total Protein: 6.8 g/dL (ref 6.0–8.3)

## 2019-09-09 LAB — HEMOGLOBIN A1C: Hgb A1c MFr Bld: 5.8 % (ref 4.6–6.5)

## 2019-09-09 LAB — TSH: TSH: 3.58 u[IU]/mL (ref 0.35–4.50)

## 2019-09-09 NOTE — Assessment & Plan Note (Signed)
Chronic Check a1c Low sugar / carb diet Stressed regular exercise  

## 2019-09-09 NOTE — Assessment & Plan Note (Signed)
Chronic Has some frequency, especially nocturia-some nights are worse than others Feels the medications are working-we will continue

## 2019-09-09 NOTE — Assessment & Plan Note (Signed)
Chronic Following with Dr. Carles Collet Only taking propranolol once a day, but it does help more when he takes twice a day-encouraged him to take it twice a day-his blood pressure is typically better controlled, but can still tolerate the propranolol Also on primidone

## 2019-09-09 NOTE — Assessment & Plan Note (Signed)
Chronic Blood pressure elevated here today-likely because he got lost Continue current medications at current doses He can monitor at home CMP

## 2019-09-12 ENCOUNTER — Other Ambulatory Visit: Payer: Self-pay | Admitting: Internal Medicine

## 2019-09-12 MED ORDER — AMLODIPINE BESY-BENAZEPRIL HCL 5-10 MG PO CAPS: 1.0000 | ORAL_CAPSULE | Freq: Every day | ORAL | 1 refills | Status: DC

## 2019-09-12 MED ORDER — PROPRANOLOL HCL 10 MG PO TABS: 20.0000 mg | ORAL_TABLET | Freq: Two times a day (BID) | ORAL | 0 refills | Status: DC

## 2019-09-14 ENCOUNTER — Other Ambulatory Visit: Payer: Self-pay | Admitting: Neurology

## 2019-10-31 ENCOUNTER — Other Ambulatory Visit: Payer: Self-pay | Admitting: Internal Medicine

## 2019-11-04 DIAGNOSIS — D1801 Hemangioma of skin and subcutaneous tissue: Secondary | ICD-10-CM | POA: Diagnosis not present

## 2019-11-04 DIAGNOSIS — Z85828 Personal history of other malignant neoplasm of skin: Secondary | ICD-10-CM | POA: Diagnosis not present

## 2019-11-04 DIAGNOSIS — C4441 Basal cell carcinoma of skin of scalp and neck: Secondary | ICD-10-CM | POA: Diagnosis not present

## 2019-11-04 DIAGNOSIS — D485 Neoplasm of uncertain behavior of skin: Secondary | ICD-10-CM | POA: Diagnosis not present

## 2019-11-04 DIAGNOSIS — L821 Other seborrheic keratosis: Secondary | ICD-10-CM | POA: Diagnosis not present

## 2019-11-28 ENCOUNTER — Telehealth: Payer: Self-pay

## 2019-11-28 NOTE — Telephone Encounter (Signed)
Pt rescheduled video visit due to conflict. Confirmed video visit process.

## 2019-12-04 ENCOUNTER — Telehealth: Payer: Medicare Other | Admitting: Neurology

## 2019-12-04 NOTE — Progress Notes (Signed)
Assessment/Plan:    1.  Essential Tremor  -Primary care recently increased propranolol for blood pressure control (because they needed to decrease his Benzapril because he was having hyperkalemia).  Will therefore let primary care take over management.  -Continue primidone, 50 mg at bedtime.  -we discussed DBS and focused u/s in detail.  He doesn't think that he is interested.    2.  Hyperkalemia  -Felt due to Benzapril.  It does look like primary care had wanted a repeat potassium done a week after his blood pressure medication changes were made.  He does not appear that this was done and I reminded him about that.  3.  As above, since primary care is taking over his propranolol for blood pressure control, I told him to just follow-up as needed an as-needed basis.  4.  F/u prn.  Since pcp filling propranolol and I haven't changed primidone, he can just f/u with me prn.  I refilled primidone for the year.  After that, can let pcp refill.  He is agreeable  Subjective:   Roger Ingram was seen today in follow up for essential tremor.  My previous records were reviewed prior to todays visit.  Medical records are reviewed.  I noted that his primary care physician increased his propranolol in January to 20 mg twice per day for blood pressure control.  This was primarily because his potassium was too high and it was thought due to his Benzapril, which needed to be decreased, so she correspondingly increased his propranolol.  He didn't even realize this.  He states that he didn't look at the new dosage when he picked up the new RX at the pharmacy.   States that 90% of the time he doesn't know that he has tremor.  He notes it when writing or filling out paperwork.  He asks about focused u/s.  Current prescribed movement disorder medications: Propranolol, 10 mg, 1 to 2 tablets/day was what he was on last visit for tremor, but primary care has increased that to 20 mg twice per day for blood pressure  control Primidone, 50 mg at bedtime     ALLERGIES:   Allergies  Allergen Reactions  . Hydrocodone Other (See Comments)    Double vision / diplopia  . Iohexol Other (See Comments)    Syncope post IV dye for renal calculi 1963    CURRENT MEDICATIONS:  Outpatient Encounter Medications as of 12/09/2019  Medication Sig  . amLODipine-benazepril (LOTREL) 5-10 MG capsule Take 1 capsule by mouth daily.  Marland Kitchen aspirin EC 81 MG tablet Take 81 mg by mouth daily.  . finasteride (PROSCAR) 5 MG tablet Take 1 tablet by mouth once daily  . folic acid (FOLVITE) A999333 MCG tablet Take 400 mcg by mouth daily.  . meclizine (ANTIVERT) 12.5 MG tablet Take 1-2 tablets (12.5-25 mg total) by mouth 3 (three) times daily as needed for dizziness.  . primidone (MYSOLINE) 50 MG tablet TAKE 1 TABLET BY MOUTH AT BEDTIME  . propranolol (INDERAL) 20 MG tablet Take 20 mg by mouth 2 (two) times daily.  . tamsulosin (FLOMAX) 0.4 MG CAPS capsule Take 1 capsule by mouth once daily  . [DISCONTINUED] propranolol (INDERAL) 10 MG tablet TAKE 1 TABLET BY MOUTH TWICE DAILY FOR ESSENTIAL TREMOR   No facility-administered encounter medications on file as of 12/09/2019.     Objective:    PHYSICAL EXAMINATION:    VITALS:   Vitals:   12/09/19 0808  BP: 130/63  Pulse: Marland Kitchen)  57  SpO2: 99%  Weight: 148 lb (67.1 kg)  Height: 5\' 8"  (1.727 m)    GEN:  The patient appears stated age and is in NAD. HEENT:  Normocephalic, atraumatic.  The mucous membranes are moist. The superficial temporal arteries are without ropiness or tenderness. CV:  RRR Lungs:  CTAB Neck/HEME:  There are no carotid bruits bilaterally.  Neurological examination:  Orientation: The patient is alert and oriented x3. Cranial nerves: There is good facial symmetry. The speech is fluent and clear. Soft palate rises symmetrically and there is no tongue deviation. Hearing is intact to conversational tone. Sensation: Sensation is intact to light touch  throughout Motor: Strength is at least antigravity x4.  Movement examination: Tone: There is normal tone in the UE/LE Abnormal movements: mild tremor of the outstretched hands.  Not worse when given a weight Coordination:  There is no decremation with RAM's Gait and Station: The patient has no difficulty arising out of a deep-seated chair without the use of the hands. The patient's stride length is good  I have reviewed and interpreted the following labs independently   Chemistry      Component Value Date/Time   NA 135 09/09/2019 1436   K 5.6 No hemolysis seen (H) 09/09/2019 1436   CL 103 09/09/2019 1436   CO2 26 09/09/2019 1436   BUN 27 (H) 09/09/2019 1436   CREATININE 1.25 09/09/2019 1436      Component Value Date/Time   CALCIUM 9.5 09/09/2019 1436   ALKPHOS 59 09/09/2019 1436   AST 17 09/09/2019 1436   ALT 16 09/09/2019 1436   BILITOT 0.4 09/09/2019 1436     Lab Results  Component Value Date   TSH 3.58 09/09/2019      Total time spent on today's visit was 30 minutes, including both face-to-face time and nonface-to-face time.  Time included that spent on review of records (prior notes available to me/labs/imaging if pertinent), discussing treatment and goals, answering patient's questions and coordinating care.  Cc:  Binnie Rail, MD

## 2019-12-09 ENCOUNTER — Other Ambulatory Visit: Payer: Self-pay

## 2019-12-09 ENCOUNTER — Telehealth (INDEPENDENT_AMBULATORY_CARE_PROVIDER_SITE_OTHER): Payer: Medicare Other | Admitting: Neurology

## 2019-12-09 ENCOUNTER — Encounter: Payer: Self-pay | Admitting: Neurology

## 2019-12-09 VITALS — BP 130/63 | HR 57 | Ht 68.0 in | Wt 148.0 lb

## 2019-12-09 DIAGNOSIS — E875 Hyperkalemia: Secondary | ICD-10-CM | POA: Diagnosis not present

## 2019-12-09 DIAGNOSIS — G250 Essential tremor: Secondary | ICD-10-CM | POA: Diagnosis not present

## 2019-12-09 MED ORDER — PRIMIDONE 50 MG PO TABS: 50.0000 mg | ORAL_TABLET | Freq: Every day | ORAL | 3 refills | Status: DC

## 2019-12-09 NOTE — Patient Instructions (Signed)
1.  Call your PCP.  I think that they want to recheck your potassium 2.  I refilled your primidone 3.  Let me know if you need me!  The physicians and staff at [facility] are committed to providing excellent care. You may receive a survey requesting feedback about your experience at our office. We strive to receive "very good" responses to the survey questions. If you feel that your experience would prevent you from giving the office a "very good " response, please contact our office to try to remedy the situation. We may be reached at [phone]. Thank you for taking the time out of your busy day to complete the survey.

## 2020-02-06 ENCOUNTER — Telehealth: Payer: Self-pay

## 2020-02-06 ENCOUNTER — Other Ambulatory Visit: Payer: Self-pay

## 2020-02-06 MED ORDER — FINASTERIDE 5 MG PO TABS: 5.0000 mg | ORAL_TABLET | Freq: Every day | ORAL | 0 refills | Status: DC

## 2020-02-06 NOTE — Telephone Encounter (Signed)
1.Medication Requested:finasteride (PROSCAR) 5 MG tablet  2. Pharmacy (Name, Street, [place]): [facility] [address]   3. On Med List: Yes   4. Last Visit with PCP: 1.25.2021   5. Next visit date with PCP: 1.27.2022    Agent: Please be advised that RX refills may take up to 3 business days. We ask that you follow-up with your pharmacy.

## 2020-02-06 NOTE — Telephone Encounter (Signed)
Sent in today for patient. 

## 2020-02-10 ENCOUNTER — Other Ambulatory Visit: Payer: Self-pay | Admitting: Internal Medicine

## 2020-03-24 DIAGNOSIS — H2513 Age-related nuclear cataract, bilateral: Secondary | ICD-10-CM | POA: Diagnosis not present

## 2020-03-24 DIAGNOSIS — H52203 Unspecified astigmatism, bilateral: Secondary | ICD-10-CM | POA: Diagnosis not present

## 2020-03-24 DIAGNOSIS — H5203 Hypermetropia, bilateral: Secondary | ICD-10-CM | POA: Diagnosis not present

## 2020-04-06 DIAGNOSIS — H2511 Age-related nuclear cataract, right eye: Secondary | ICD-10-CM | POA: Diagnosis not present

## 2020-04-06 DIAGNOSIS — H25043 Posterior subcapsular polar age-related cataract, bilateral: Secondary | ICD-10-CM | POA: Diagnosis not present

## 2020-04-06 DIAGNOSIS — H25013 Cortical age-related cataract, bilateral: Secondary | ICD-10-CM | POA: Diagnosis not present

## 2020-04-06 DIAGNOSIS — H35371 Puckering of macula, right eye: Secondary | ICD-10-CM | POA: Diagnosis not present

## 2020-04-06 DIAGNOSIS — H18513 Endothelial corneal dystrophy, bilateral: Secondary | ICD-10-CM | POA: Diagnosis not present

## 2020-04-06 DIAGNOSIS — H2513 Age-related nuclear cataract, bilateral: Secondary | ICD-10-CM | POA: Diagnosis not present

## 2020-04-08 ENCOUNTER — Other Ambulatory Visit: Payer: Self-pay | Admitting: Internal Medicine

## 2020-04-09 DIAGNOSIS — Z23 Encounter for immunization: Secondary | ICD-10-CM | POA: Diagnosis not present

## 2020-04-30 ENCOUNTER — Other Ambulatory Visit: Payer: Self-pay | Admitting: Internal Medicine

## 2020-05-05 DIAGNOSIS — H25042 Posterior subcapsular polar age-related cataract, left eye: Secondary | ICD-10-CM | POA: Diagnosis not present

## 2020-05-05 DIAGNOSIS — H25012 Cortical age-related cataract, left eye: Secondary | ICD-10-CM | POA: Diagnosis not present

## 2020-05-05 DIAGNOSIS — H2512 Age-related nuclear cataract, left eye: Secondary | ICD-10-CM | POA: Diagnosis not present

## 2020-05-05 DIAGNOSIS — H2511 Age-related nuclear cataract, right eye: Secondary | ICD-10-CM | POA: Diagnosis not present

## 2020-05-12 DIAGNOSIS — H2512 Age-related nuclear cataract, left eye: Secondary | ICD-10-CM | POA: Diagnosis not present

## 2020-05-17 ENCOUNTER — Other Ambulatory Visit: Payer: Self-pay | Admitting: Internal Medicine

## 2020-06-08 ENCOUNTER — Other Ambulatory Visit: Payer: Self-pay | Admitting: Internal Medicine

## 2020-06-15 NOTE — Progress Notes (Addendum)
Virtual Visit via telephone Note  I connected with Roger Ingram on 06/15/20 at 10:45 AM EDT by telephone and verified that I am speaking with the correct person using two identifiers.   I discussed the limitations of evaluation and management by telemedicine and the availability of in person appointments. The patient expressed understanding and agreed to proceed.  Present for the visit:  Myself, Dr Billey Gosling, Kirt Boys.  The patient is currently at home and I am in the office.    No referring provider.    History of Present Illness: This is an acute visit for dizziness, stomach pain, diarrhea.  He states his stomach symptoms have cleared up over the past 2-3 days.  He thinks it may have been a flare of his IBS, which she has had in the past.  He still having some dizziness and soreness in his neck on the right side.  Dizziness worse when he sits up in bed.  The longer he is up the better it gets, but he still has it during the day.  The muscle on the right side of the neck are sore.  Massaging the muscles help a little bit.  He wonders if this is related to the fact that he only sleeps on the right side.  Years ago he had an x-ray of his neck and he did have a curve in the spine-he is unsure if that is contributing or not.  He denies any headaches.  He is not taking anything for the pain.    Review of Systems  Constitutional: Negative for fever.  Gastrointestinal: Negative for abdominal pain, blood in stool, diarrhea and nausea.  Musculoskeletal: Positive for neck pain.  Neurological: Positive for dizziness. Negative for sensory change, focal weakness, weakness and headaches.      Social History   Socioeconomic History  . Marital status: Married    Spouse name: Not on file  . Number of children: 3  . Years of education: Not on file  . Highest education level: Professional school degree (e.g., MD, DDS, DVM, JD)  Occupational History  . Occupation: retired    Comment: Insurance underwriter   Tobacco Use  . Smoking status: Never Smoker  . Smokeless tobacco: Never Used  Vaping Use  . Vaping Use: Never used  Substance and Sexual Activity  . Alcohol use: No    Alcohol/week: 0.0 standard drinks  . Drug use: No  . Sexual activity: Not on file  Other Topics Concern  . Not on file  Social History Narrative   Walks twice daily for exercise   Pt lives at home with spouse 1 story home- he has 3 children 2 which are living   Pt can use both hands to write   Social Determinants of Health   Financial Resource Strain:   . Difficulty of Paying Living Expenses: Not on file  Food Insecurity:   . Worried About Charity fundraiser in the Last Year: Not on file  . Ran Out of Food in the Last Year: Not on file  Transportation Needs:   . Lack of Transportation (Medical): Not on file  . Lack of Transportation (Non-Medical): Not on file  Physical Activity:   . Days of Exercise per Week: Not on file  . Minutes of Exercise per Session: Not on file  Stress:   . Feeling of Stress : Not on file  Social Connections:   . Frequency of Communication with Friends and Family: Not on file  .  Frequency of Social Gatherings with Friends and Family: Not on file  . Attends Religious Services: Not on file  . Active Member of Clubs or Organizations: Not on file  . Attends Archivist Meetings: Not on file  . Marital Status: Not on file     Observations/Objective:  MR Brain Wo Contrast CLINICAL DATA:  Episodic peripheral vertigo. Dizziness when laying down  EXAM: MRI HEAD WITHOUT CONTRAST  TECHNIQUE: Multiplanar, multiecho pulse sequences of the brain and surrounding structures were obtained without intravenous contrast.  COMPARISON:  10/11/2002  FINDINGS: Brain: No acute or subacute infarction, hemorrhage, hydrocephalus, extra-axial collection or mass lesion. Mild chronic small vessel ischemic type change in the cerebral white matter, less than usually seen by the patient's  age. The pons is largely spared. Normal appearance of the cisterns and temporal bones. Age congruent cerebral volume loss.  Vascular: Normal flow voids, including vertebrobasilar.  Skull and upper cervical spine: Normal marrow signal.  Sinuses/Orbits: Negative  IMPRESSION: Age normal brain MRI  Electronically Signed   By: Monte Fantasia M.D.   On: 05/30/2019 04:46    Assessment and Plan:  Stomach pain, diarrhea: Have resolved No further evaluation necessary   See Problem List for Assessment and Plan of chronic medical problems.   Follow Up Instructions:    I discussed the assessment and treatment plan with the patient. The patient was provided an opportunity to ask questions and all were answered. The patient agreed with the plan and demonstrated an understanding of the instructions.   The patient was advised to call back or seek an in-person evaluation if the symptoms worsen or if the condition fails to improve as anticipated.  Time spent on telephone call: 8 minutes  Binnie Rail, MD

## 2020-06-16 ENCOUNTER — Telehealth (INDEPENDENT_AMBULATORY_CARE_PROVIDER_SITE_OTHER): Payer: Medicare Other | Admitting: Internal Medicine

## 2020-06-16 ENCOUNTER — Encounter: Payer: Self-pay | Admitting: Internal Medicine

## 2020-06-16 DIAGNOSIS — H81399 Other peripheral vertigo, unspecified ear: Secondary | ICD-10-CM

## 2020-06-16 DIAGNOSIS — M542 Cervicalgia: Secondary | ICD-10-CM | POA: Diagnosis not present

## 2020-06-16 NOTE — Assessment & Plan Note (Signed)
Acute On right side-posterior neck Massaging the area helps Discussed that he can try Tylenol, Advil for short period of time, Biofreeze or anything topical Can also try ice or heat He will try sleeping with a different pillow to see if that helps him feel he sleeps on the right side Can consider physical therapy if there is no improvement

## 2020-06-16 NOTE — Assessment & Plan Note (Signed)
Acute Having dizziness that is worse with head movements, especially getting into and out of bed He did have a episode that was similar about a year ago.  An MRI at that time was negative. Advised that he try nondrowsy Dramamine to see if that helps Discussed that we can consider physical therapy, especially since he is having the neck pain to could possibly be related He will call if the Dramamine does not help, his symptoms do not improve or he wants to consider physical therapy

## 2020-06-19 ENCOUNTER — Telehealth: Payer: Self-pay | Admitting: Neurology

## 2020-06-19 NOTE — Telephone Encounter (Signed)
Patient called in and left a message. He had just spoken with Darden Dates and wanted to speak with her again

## 2020-06-19 NOTE — Telephone Encounter (Signed)
Patient given Dr Doristine Devoid recommendations and voiced understanding.

## 2020-06-19 NOTE — Telephone Encounter (Signed)
He just had a phone visit with PCP about this and she thought it was peripheral vertigo.  That would be out of my realm of expertise (would be an ENT issue).  If still there, might need in person visit with PCP or with ENT.

## 2020-06-19 NOTE — Telephone Encounter (Signed)
Left message for patient to contact office.

## 2020-06-19 NOTE — Telephone Encounter (Signed)
Patient called and said he's like to follow up with Dr. Carles Collet for "dizziness that has been going on for about a week." Routing to clinical staff for further assessment.

## 2020-06-19 NOTE — Telephone Encounter (Signed)
Patient left voicemail returning your call. Requesting call back. Thanks!

## 2020-06-22 NOTE — Telephone Encounter (Signed)
Spoke with patient and he wanted to verifiy that Dr Tat wanted him to go to ENT if his dizziness did not get better. He states he will do that but the dizziness is getting better. He states he did reach out to his pcp.   Gave him Dr Doristine Devoid recommendations and he voiced understanding.

## 2020-06-29 ENCOUNTER — Telehealth: Payer: Self-pay | Admitting: Internal Medicine

## 2020-06-29 DIAGNOSIS — R42 Dizziness and giddiness: Secondary | ICD-10-CM

## 2020-06-29 NOTE — Telephone Encounter (Signed)
ordered

## 2020-06-29 NOTE — Telephone Encounter (Signed)
   Patient requesting referral to ENT. Patient recently saw Dr Tat (Neuro) recommend patient see ENT for dizziness

## 2020-07-13 ENCOUNTER — Other Ambulatory Visit: Payer: Self-pay | Admitting: Internal Medicine

## 2020-08-04 ENCOUNTER — Other Ambulatory Visit: Payer: Self-pay | Admitting: Internal Medicine

## 2020-09-09 DIAGNOSIS — E875 Hyperkalemia: Secondary | ICD-10-CM | POA: Insufficient documentation

## 2020-09-09 NOTE — Patient Instructions (Addendum)
°  Blood work was ordered.     Flu immunization administered today.     Medications changes include :   none    Please followup in 6 months  

## 2020-09-09 NOTE — Progress Notes (Signed)
Subjective:    Patient ID: Roger Ingram, male    DOB: October 12, 1928, 87 y.o.   MRN: [id number]  HPI He is here for follow up of his chronic medical conditions.     He had his cataracts done.  His vision is much better.    He has some dizziness for the past couple of months.   It is related to his head position.  It occurs when he sits up in bed and lays down in bed.  It is transient. He may get it during the day if he turns to the right and may occasionally stagger to the right.    He has a scab on the tragus of his left ear.  It has not healed.  He is trying to get with his dermatologist.  It itches all the time.  It never gets completely well.    He has soreness up and down the right posterior neck.  It starts at the bottom of his cervical spine.     Medications and allergies reviewed with patient and updated if appropriate.  Patient Active Problem List   Diagnosis Date Noted  . Hyperkalemia 09/09/2020  . Neck pain, musculoskeletal 06/16/2020  . Other peripheral vertigo, unspecified ear 05/24/2019  . Chronic left SI joint pain 08/03/2018  . Diverticulitis large intestine 02/08/2018  . Abdominal pain 01/31/2018  . Osteoarthritis of left hip 12/19/2014  . Status post total replacement of left hip 12/19/2014  . Degenerative joint disease (DJD) of hip 11/13/2014  . Popliteal cyst, unruptured 06/17/2014  . DDD (degenerative disc disease), lumbosacral 10/26/2013  . Essential hypertension 07/06/2009  . Prediabetes 10/23/2008  . Hyperlipidemia 10/02/2008  . Essential tremor 10/02/2008  . Diverticulosis of large intestine 10/02/2008  . NEURITIS 10/02/2008  . MALIGNANT MELANOMA, HX OF 10/02/2008  . SKIN CANCER, HX OF 10/02/2008  . History of colonic polyps 10/02/2008  . BPH (benign prostatic hyperplasia) 06/02/2008    Current Outpatient Medications on File Prior to Visit  Medication Sig Dispense Refill  . amLODipine-benazepril (LOTREL) 5-10 MG capsule Take 1 capsule by mouth  once daily 90 capsule 0  . aspirin EC 81 MG tablet Take 81 mg by mouth daily.    . finasteride (PROSCAR) 5 MG tablet Take 1 tablet (5 mg total) by mouth daily. Annual appt due in Jan must see provider for future refills 90 tablet 0  . folic acid (FOLVITE) 401 MCG tablet Take 400 mcg by mouth daily.    . meclizine (ANTIVERT) 12.5 MG tablet Take 1-2 tablets (12.5-25 mg total) by mouth 3 (three) times daily as needed for dizziness. 40 tablet 0  . primidone (MYSOLINE) 50 MG tablet Take 1 tablet (50 mg total) by mouth at bedtime. 90 tablet 3  . propranolol (INDERAL) 10 MG tablet TAKE 1 TABLET BY MOUTH TWICE DAILY FOR  ESSENTIAL  TREMOR 180 tablet 0  . tamsulosin (FLOMAX) 0.4 MG CAPS capsule Take 1 capsule by mouth once daily 90 capsule 0   No current facility-administered medications on file prior to visit.    Past Medical History:  Diagnosis Date  . Basal cell cancer   . BPH (benign prostatic hypertrophy)    Dr Junious Silk  . Chronic kidney disease    Hx of clear fluid sacs on both kidneys. Drained about 2006  . Diverticulosis of colon   . Elevated PSA    [facility]  . History of kidney stones   . Hx of colonic polyps   .  Hyperlipidemia   . Hypertension   . Melanoma [facility])     Past Surgical History:  Procedure Laterality Date  . basal cell     R  & L temple; Dr Allyson Sabal  . COLONOSCOPY W/ POLYPECTOMY  1997, 2011   Dr Earlean Shawl  . melanoma     L forehead  . Right rotator cuff sx    . TOTAL HIP ARTHROPLASTY Left 12/19/2014   Procedure: LEFT TOTAL HIP ARTHROPLASTY ANTERIOR APPROACH;  Surgeon: Mcarthur Rossetti, MD;  Location: [facility];  Service: Orthopedics;  Laterality: Left;    Social History   Socioeconomic History  . Marital status: Married    Spouse name: Not on file  . Number of children: 3  . Years of education: Not on file  . Highest education level: Professional school degree (e.g., MD, DDS, DVM, JD)  Occupational History  . Occupation: retired    Comment: Insurance underwriter   Tobacco Use  . Smoking status: Never Smoker  . Smokeless tobacco: Never Used  Vaping Use  . Vaping Use: Never used  Substance and Sexual Activity  . Alcohol use: No    Alcohol/week: 0.0 standard drinks  . Drug use: No  . Sexual activity: Not on file  Other Topics Concern  . Not on file  Social History Narrative   Walks twice daily for exercise   Pt lives at home with spouse 1 story home- he has 3 children 2 which are living   Pt can use both hands to write   Social Determinants of Health   Financial Resource Strain: Not on file  Food Insecurity: Not on file  Transportation Needs: Not on file  Physical Activity: Not on file  Stress: Not on file  Social Connections: Not on file    Family History  Problem Relation Age of Onset  . Colon cancer Brother 84  . Other Sister        addisons disease  . Dementia Sister   . Stroke Brother   . Stroke Mother 46  . Heart attack Father 35  . Stroke Father   . Stroke Paternal Grandfather        in 83s  . Diabetes Son        IDDM  . Crohn's disease Daughter   . Drug abuse Son     Review of Systems  Constitutional: Negative for chills and fever.  HENT: Negative for congestion, ear pain, sinus pain and sore throat.   Respiratory: Negative for cough, shortness of breath and wheezing.   Cardiovascular: Negative for chest pain, palpitations and leg swelling.  Gastrointestinal: Negative for abdominal pain and nausea.  Neurological: Positive for dizziness and tremors. Negative for headaches.       Objective:   Vitals:   09/10/20 0850  BP: 132/68  Pulse: 60  Temp: 97.9 F (36.6 C)  SpO2: 97%   Filed Weights   09/10/20 0850  Weight: 153 lb 3.2 oz (69.5 kg)   Body mass index is 23.29 kg/m.  BP Readings from Last 3 Encounters:  09/10/20 132/68  12/09/19 130/63  09/09/19 (!) 144/78    Wt Readings from Last 3 Encounters:  09/10/20 153 lb 3.2 oz (69.5 kg)  12/09/19 148 lb (67.1 kg)  09/09/19 148 lb (67.1 kg)      Physical Exam Constitutional: He appears well-developed and well-nourished. No distress.  HENT:  Head: Normocephalic and atraumatic.  Right Ear: External ear normal.  Left Ear: External ear normal.  Mouth/Throat: Oropharynx is clear  and moist.  Normal ear canals and TM b/l  Eyes: Conjunctivae and EOM are normal.  Neck: Neck supple. No tracheal deviation present. No thyromegaly present.  No carotid bruit  Cardiovascular: Normal rate, regular rhythm, normal heart sounds and intact distal pulses.   No murmur heard. Pulmonary/Chest: Effort normal and breath sounds normal. No respiratory distress. He has no wheezes. He has no rales.  Abdominal: Soft. He exhibits no distension. There is no tenderness.  Musculoskeletal: He exhibits no edema.  Lymphadenopathy:   He has no cervical adenopathy.  Skin: Skin is warm and dry. He is not diaphoretic. Scab left tragus Psychiatric: He has a normal mood and affect. His behavior is normal.         Assessment & Plan:    See Problem List for Assessment and Plan of chronic medical problems.   This visit occurred during the SARS-CoV-2 public health emergency.  Safety protocols were in place, including screening questions prior to the visit, additional usage of staff PPE, and extensive cleaning of exam room while observing appropriate contact time as indicated for disinfecting solutions.

## 2020-09-10 ENCOUNTER — Other Ambulatory Visit: Payer: Self-pay

## 2020-09-10 ENCOUNTER — Encounter: Payer: Self-pay | Admitting: Internal Medicine

## 2020-09-10 ENCOUNTER — Ambulatory Visit (INDEPENDENT_AMBULATORY_CARE_PROVIDER_SITE_OTHER): Payer: Medicare Other | Admitting: Internal Medicine

## 2020-09-10 VITALS — BP 132/68 | HR 60 | Temp 97.9°F | Ht 68.0 in | Wt 153.2 lb

## 2020-09-10 DIAGNOSIS — N400 Enlarged prostate without lower urinary tract symptoms: Secondary | ICD-10-CM

## 2020-09-10 DIAGNOSIS — E875 Hyperkalemia: Secondary | ICD-10-CM | POA: Diagnosis not present

## 2020-09-10 DIAGNOSIS — G250 Essential tremor: Secondary | ICD-10-CM

## 2020-09-10 DIAGNOSIS — Z23 Encounter for immunization: Secondary | ICD-10-CM

## 2020-09-10 DIAGNOSIS — I10 Essential (primary) hypertension: Secondary | ICD-10-CM | POA: Diagnosis not present

## 2020-09-10 DIAGNOSIS — R7303 Prediabetes: Secondary | ICD-10-CM | POA: Diagnosis not present

## 2020-09-10 DIAGNOSIS — Z0000 Encounter for general adult medical examination without abnormal findings: Secondary | ICD-10-CM

## 2020-09-10 DIAGNOSIS — H81399 Other peripheral vertigo, unspecified ear: Secondary | ICD-10-CM | POA: Diagnosis not present

## 2020-09-10 DIAGNOSIS — E7849 Other hyperlipidemia: Secondary | ICD-10-CM

## 2020-09-10 LAB — CBC WITH DIFFERENTIAL/PLATELET
Basophils Absolute: 0 10*3/uL (ref 0.0–0.1)
Basophils Relative: 0.6 % (ref 0.0–3.0)
Eosinophils Absolute: 0.2 10*3/uL (ref 0.0–0.7)
Eosinophils Relative: 3.2 % (ref 0.0–5.0)
HCT: 42.9 % (ref 39.0–52.0)
Hemoglobin: 14.3 g/dL (ref 13.0–17.0)
Lymphocytes Relative: 34.1 % (ref 12.0–46.0)
Lymphs Abs: 2.1 10*3/uL (ref 0.7–4.0)
MCHC: 33.4 g/dL (ref 30.0–36.0)
MCV: 90.2 fl (ref 78.0–100.0)
Monocytes Absolute: 0.5 10*3/uL (ref 0.1–1.0)
Monocytes Relative: 8.7 % (ref 3.0–12.0)
Neutro Abs: 3.2 10*3/uL (ref 1.4–7.7)
Neutrophils Relative %: 53.4 % (ref 43.0–77.0)
Platelets: 213 10*3/uL (ref 150.0–400.0)
RBC: 4.76 Mil/uL (ref 4.22–5.81)
RDW: 12.9 % (ref 11.5–15.5)
WBC: 6.1 10*3/uL (ref 4.0–10.5)

## 2020-09-10 LAB — COMPREHENSIVE METABOLIC PANEL
ALT: 18 U/L (ref 0–53)
AST: 18 U/L (ref 0–37)
Albumin: 4.2 g/dL (ref 3.5–5.2)
Alkaline Phosphatase: 57 U/L (ref 39–117)
BUN: 26 mg/dL — ABNORMAL HIGH (ref 6–23)
CO2: 29 mEq/L (ref 19–32)
Calcium: 9.8 mg/dL (ref 8.4–10.5)
Chloride: 104 mEq/L (ref 96–112)
Creatinine, Ser: 1.36 mg/dL (ref 0.40–1.50)
GFR: 46.05 mL/min — ABNORMAL LOW (ref >60.00)
Glucose, Bld: 111 mg/dL — ABNORMAL HIGH (ref 70–99)
Potassium: 4.9 mEq/L (ref 3.5–5.1)
Sodium: 138 mEq/L (ref 135–145)
Total Bilirubin: 0.6 mg/dL (ref 0.2–1.2)
Total Protein: 7.2 g/dL (ref 6.0–8.3)

## 2020-09-10 LAB — LIPID PANEL
Cholesterol: 210 mg/dL — ABNORMAL HIGH (ref 0–200)
HDL: 68.6 mg/dL (ref >39.00)
LDL Cholesterol: 129 mg/dL — ABNORMAL HIGH (ref 0–99)
NonHDL: 141.69
Total CHOL/HDL Ratio: 3
Triglycerides: 64 mg/dL (ref 0.0–149.0)
VLDL: 12.8 mg/dL (ref 0.0–40.0)

## 2020-09-10 LAB — TSH: TSH: 4.95 u[IU]/mL — ABNORMAL HIGH (ref 0.35–4.50)

## 2020-09-10 LAB — HEMOGLOBIN A1C: Hgb A1c MFr Bld: 5.9 % (ref 4.6–6.5)

## 2020-09-10 NOTE — Assessment & Plan Note (Signed)
Chronic Fairly controlled, but does annoy him at times Continue propranolol 10 mg BID and primidone 50 mg HS

## 2020-09-10 NOTE — Assessment & Plan Note (Signed)
chronic Intermittent cmp

## 2020-09-10 NOTE — Assessment & Plan Note (Addendum)
Chronic, intermittent Has had several episodes - current episode lasting longer than usual MRI WNL 05/2019, which was done for similar symptoms Discussed meclizine, non-drowsiness and PT - deferred at this time Has appt with ENT - will discuss with him

## 2020-09-10 NOTE — Assessment & Plan Note (Signed)
Chronic Check a1c Low sugar / carb diet Stressed regular exercise  

## 2020-09-10 NOTE — Assessment & Plan Note (Signed)
Chronic Check lipid panel, cmp, tsh Continue control with lifestyle Regular exercise and healthy diet encouraged

## 2020-09-10 NOTE — Assessment & Plan Note (Signed)
Chronic Still with nocturia Symptoms fairly controlled Continue flomax 0.4 mg daily and proscar 5 mg daily

## 2020-09-10 NOTE — Assessment & Plan Note (Addendum)
Chronic BP well controlled Continue lotrel 5-10 mg daily and propranolol 10 mg BID which is primarily for his tremor Cmp, cbc

## 2020-09-15 ENCOUNTER — Other Ambulatory Visit: Payer: Self-pay | Admitting: Internal Medicine

## 2020-09-23 DIAGNOSIS — C44319 Basal cell carcinoma of skin of other parts of face: Secondary | ICD-10-CM | POA: Diagnosis not present

## 2020-09-23 DIAGNOSIS — Z85828 Personal history of other malignant neoplasm of skin: Secondary | ICD-10-CM | POA: Diagnosis not present

## 2020-09-23 DIAGNOSIS — D485 Neoplasm of uncertain behavior of skin: Secondary | ICD-10-CM | POA: Diagnosis not present

## 2020-09-23 DIAGNOSIS — C44219 Basal cell carcinoma of skin of left ear and external auricular canal: Secondary | ICD-10-CM | POA: Diagnosis not present

## 2020-10-06 DIAGNOSIS — H8110 Benign paroxysmal vertigo, unspecified ear: Secondary | ICD-10-CM | POA: Insufficient documentation

## 2020-10-06 DIAGNOSIS — H9190 Unspecified hearing loss, unspecified ear: Secondary | ICD-10-CM | POA: Insufficient documentation

## 2020-10-06 DIAGNOSIS — H903 Sensorineural hearing loss, bilateral: Secondary | ICD-10-CM | POA: Diagnosis not present

## 2020-10-12 ENCOUNTER — Other Ambulatory Visit: Payer: Self-pay | Admitting: Internal Medicine

## 2020-10-12 DIAGNOSIS — C44219 Basal cell carcinoma of skin of left ear and external auricular canal: Secondary | ICD-10-CM | POA: Diagnosis not present

## 2020-10-12 DIAGNOSIS — Z85828 Personal history of other malignant neoplasm of skin: Secondary | ICD-10-CM | POA: Diagnosis not present

## 2020-10-30 ENCOUNTER — Other Ambulatory Visit: Payer: Self-pay | Admitting: Internal Medicine

## 2020-11-04 DIAGNOSIS — D0461 Carcinoma in situ of skin of right upper limb, including shoulder: Secondary | ICD-10-CM | POA: Diagnosis not present

## 2020-11-04 DIAGNOSIS — D485 Neoplasm of uncertain behavior of skin: Secondary | ICD-10-CM | POA: Diagnosis not present

## 2020-11-04 DIAGNOSIS — Z85828 Personal history of other malignant neoplasm of skin: Secondary | ICD-10-CM | POA: Diagnosis not present

## 2020-11-04 DIAGNOSIS — L570 Actinic keratosis: Secondary | ICD-10-CM | POA: Diagnosis not present

## 2020-11-16 ENCOUNTER — Other Ambulatory Visit: Payer: Self-pay | Admitting: Internal Medicine

## 2020-11-16 DIAGNOSIS — Z23 Encounter for immunization: Secondary | ICD-10-CM | POA: Diagnosis not present

## 2020-12-31 ENCOUNTER — Other Ambulatory Visit: Payer: Self-pay | Admitting: Internal Medicine
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
## 2021-01-14 ENCOUNTER — Ambulatory Visit (INDEPENDENT_AMBULATORY_CARE_PROVIDER_SITE_OTHER): Payer: Medicare Other

## 2021-01-14 ENCOUNTER — Encounter: Payer: Self-pay | Admitting: Internal Medicine

## 2021-01-14 ENCOUNTER — Other Ambulatory Visit: Payer: Self-pay

## 2021-01-14 ENCOUNTER — Ambulatory Visit (INDEPENDENT_AMBULATORY_CARE_PROVIDER_SITE_OTHER): Payer: Medicare Other | Admitting: Internal Medicine

## 2021-01-14 VITALS — BP 140/72 | HR 52 | Temp 98.3°F | Ht 68.0 in | Wt 151.6 lb

## 2021-01-14 DIAGNOSIS — S41112A Laceration without foreign body of left upper arm, initial encounter: Secondary | ICD-10-CM | POA: Diagnosis not present

## 2021-01-14 DIAGNOSIS — M542 Cervicalgia: Secondary | ICD-10-CM

## 2021-01-14 DIAGNOSIS — M47814 Spondylosis without myelopathy or radiculopathy, thoracic region: Secondary | ICD-10-CM | POA: Diagnosis not present

## 2021-01-14 DIAGNOSIS — M47812 Spondylosis without myelopathy or radiculopathy, cervical region: Secondary | ICD-10-CM | POA: Diagnosis not present

## 2021-01-14 DIAGNOSIS — R079 Chest pain, unspecified: Secondary | ICD-10-CM | POA: Diagnosis not present

## 2021-01-14 DIAGNOSIS — M1812 Unilateral primary osteoarthritis of first carpometacarpal joint, left hand: Secondary | ICD-10-CM | POA: Diagnosis not present

## 2021-01-14 DIAGNOSIS — Z23 Encounter for immunization: Secondary | ICD-10-CM

## 2021-01-14 DIAGNOSIS — M25532 Pain in left wrist: Secondary | ICD-10-CM | POA: Insufficient documentation

## 2021-01-14 MED ORDER — FINASTERIDE 5 MG PO TABS: 5.0000 mg | ORAL_TABLET | Freq: Every day | ORAL | 0 refills | Status: DC

## 2021-01-14 NOTE — Patient Instructions (Addendum)
   Have x-rays downstairs.      You can take tylenol as needed.  You can use ice or heat on her upper back.
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
## 2021-01-17 ENCOUNTER — Other Ambulatory Visit: Payer: Self-pay | Admitting: Internal Medicine

## 2021-01-20 ENCOUNTER — Other Ambulatory Visit: Payer: Self-pay | Admitting: Internal Medicine

## 2021-02-11 ENCOUNTER — Telehealth: Payer: Self-pay | Admitting: Internal Medicine

## 2021-02-11 NOTE — Chronic Care Management (AMB) (Signed)
  Chronic Care Management   Note  02/11/2021 Name: Roger Ingram MRN: [id number] DOB: 1928-12-13  Roger Ingram is a 87 y.o. year old male who is a primary care patient of Burns, Claudina Lick, MD. I reached out to [name] by phone today in response to a referral sent by [name]'s PCP, Binnie Rail, MD.   [name] was given information about Chronic Care Management services today including:  CCM service includes personalized support from designated clinical staff supervised by his physician, including individualized plan of care and coordination with other care providers 24/7 contact phone numbers for assistance for urgent and routine care needs. Service will only be billed when office clinical staff spend 20 minutes or more in a month to coordinate care. Only one practitioner may furnish and bill the service in a calendar month. The patient may stop CCM services at any time (effective at the end of the month) by phone call to the office staff.   Patient did not agree to enrollment in care management services and does not wish to consider at this time.  Follow up plan:   [name] Upstream Scheduler

## 2021-02-18 ENCOUNTER — Telehealth: Payer: Self-pay | Admitting: Internal Medicine

## 2021-02-18 ENCOUNTER — Other Ambulatory Visit: Payer: Self-pay

## 2021-02-18 MED ORDER — TAMSULOSIN HCL 0.4 MG PO CAPS
0.4000 mg | ORAL_CAPSULE | Freq: Every day | ORAL | 0 refills | Status: DC
Start: 2021-02-18 — End: 2021-05-13

## 2021-02-18 NOTE — Telephone Encounter (Signed)
1.Medication Requested: tamsulosin (FLOMAX) 0.4 MG CAPS capsule   2. Pharmacy (Name, Street, [place]): [place], [place]  3. On Med List: yes   4. Last Visit with PCP: 01-14-21  5. Next visit date with PCP: 09-13-21   Agent: Please be advised that RX refills may take up to 3 business days. We ask that you follow-up with your pharmacy.

## 2021-02-18 NOTE — Telephone Encounter (Signed)
Faxed in today. 

## 2021-03-02 ENCOUNTER — Telehealth: Payer: Self-pay | Admitting: Internal Medicine

## 2021-03-02 NOTE — Chronic Care Management (AMB) (Signed)
  Care Management   Follow Up Note   03/02/2021 Name: HALSTON KINTZ MRN: [id number] DOB: 1928/10/01   Referred by: Binnie Rail, MD Reason for referral : No chief complaint on file.   An unsuccessful telephone outreach was attempted today. The patient was referred to the case management team for assistance with care management and care coordination.   Follow Up Plan: The care management team will reach out to the patient again over the next 7 days.   Noelle Penner Upstream Scheduler

## 2021-03-03 ENCOUNTER — Telehealth: Payer: Self-pay | Admitting: Internal Medicine

## 2021-03-03 NOTE — Telephone Encounter (Signed)
Called pt to schedule AWV with [facility]. Patient stated he will call me back to schedule. Please schedule this appt if pt calls the office.

## 2021-03-09 ENCOUNTER — Ambulatory Visit: Payer: Medicare Other | Admitting: Internal Medicine

## 2021-03-09 ENCOUNTER — Telehealth: Payer: Self-pay | Admitting: Internal Medicine

## 2021-03-09 NOTE — Chronic Care Management (AMB) (Signed)
  Chronic Care Management   Note  03/09/2021 Name: Roger Ingram MRN: [id number] DOB: 06/20/1929  [name] is a 87 y.o. year old male who is a primary care patient of Burns, Claudina Lick, MD. I reached out to [name] by phone today in response to a referral sent by Mr. [name]'s PCP, Binnie Rail, MD.   [name] was given information about Chronic Care Management services today including:  CCM service includes personalized support from designated clinical staff supervised by his physician, including individualized plan of care and coordination with other care providers 24/7 contact phone numbers for assistance for urgent and routine care needs. Service will only be billed when office clinical staff spend 20 minutes or more in a month to coordinate care. Only one practitioner may furnish and bill the service in a calendar month. The patient may stop CCM services at any time (effective at the end of the month) by phone call to the office staff.   Patient agreed to services and verbal consent obtained.   Follow up plan:   [name] Upstream Scheduler

## 2021-03-15 DIAGNOSIS — Z961 Presence of intraocular lens: Secondary | ICD-10-CM | POA: Diagnosis not present

## 2021-03-15 DIAGNOSIS — H35373 Puckering of macula, bilateral: Secondary | ICD-10-CM | POA: Diagnosis not present

## 2021-03-15 DIAGNOSIS — H52203 Unspecified astigmatism, bilateral: Secondary | ICD-10-CM | POA: Diagnosis not present

## 2021-04-16 ENCOUNTER — Ambulatory Visit: Payer: Medicare Other

## 2021-04-19 ENCOUNTER — Other Ambulatory Visit: Payer: Self-pay | Admitting: Internal Medicine

## 2021-04-21 ENCOUNTER — Other Ambulatory Visit: Payer: Self-pay | Admitting: Internal Medicine

## 2021-04-22 ENCOUNTER — Telehealth: Payer: Self-pay

## 2021-04-22 NOTE — Progress Notes (Signed)
    Chronic Care Management Pharmacy Assistant   Name: [name]  MRN: [id number] DOB: Dec 27, 1928  Roger Ingram is an 87 y.o. year old male who presents for his initial CCM visit with the clinical pharmacist.   Recent office visits:  01/14/21 Binnie Rail - Seen for neck pain - X rays ordered - No medication changes - No follow up noted   Recent consult visits:  11/04/20 Danella Sensing - Dermatology - No notes available   Hospital visits:  None in previous 6 months  Medications: Outpatient Encounter Medications as of 04/22/2021  Medication Sig   amLODipine-benazepril (LOTREL) 5-10 MG capsule Take 1 capsule by mouth once daily   aspirin EC 81 MG tablet Take 81 mg by mouth daily.   finasteride (PROSCAR) 5 MG tablet Take 1 tablet (5 mg total) by mouth daily.   folic acid (FOLVITE) A999333 MCG tablet Take 400 mcg by mouth daily.   meclizine (ANTIVERT) 12.5 MG tablet Take 1-2 tablets (12.5-25 mg total) by mouth 3 (three) times daily as needed for dizziness.   primidone (MYSOLINE) 50 MG tablet Take 1 tablet (50 mg total) by mouth at bedtime.   propranolol (INDERAL) 10 MG tablet TAKE 1 TABLET BY MOUTH TWICE DAILY FOR ESSENTIAL TREMOR   tamsulosin (FLOMAX) 0.4 MG CAPS capsule Take 1 capsule (0.4 mg total) by mouth daily.   No facility-administered encounter medications on file as of 04/22/2021.   amLODipine-benazepril (LOTREL) 5-10 MG capsule  - last filled 04/20/21 90 DS aspirin EC 81 MG tablet  - last filled 07/29/15 unknown DS finasteride (PROSCAR) 5 MG tablet  - last filled 01/14/21 90 DS meclizine (ANTIVERT) 12.5 MG tablet - last filled 06/04/19 40 tablets primidone (MYSOLINE) 50 MG tablet - last filled 12/09/19 DS propranolol (INDERAL) 10 MG tablet - last filled 04/20/2289 DS tamsulosin (FLOMAX) 0.4 MG CAPS capsule - last filled 02/18/21 90 DS   Star Rating Drugs: None    Andee Poles, CMA

## 2021-04-26 ENCOUNTER — Other Ambulatory Visit: Payer: Self-pay

## 2021-04-26 ENCOUNTER — Ambulatory Visit (INDEPENDENT_AMBULATORY_CARE_PROVIDER_SITE_OTHER): Payer: Medicare Other

## 2021-04-26 DIAGNOSIS — G250 Essential tremor: Secondary | ICD-10-CM

## 2021-04-26 DIAGNOSIS — R7303 Prediabetes: Secondary | ICD-10-CM

## 2021-04-26 DIAGNOSIS — N400 Enlarged prostate without lower urinary tract symptoms: Secondary | ICD-10-CM

## 2021-04-26 DIAGNOSIS — I10 Essential (primary) hypertension: Secondary | ICD-10-CM
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
## 2021-04-26 NOTE — Patient Instructions (Signed)
Roger Ingram,  It was great to talk to you today!  Please call me with any questions or concerns.  Visit Information   PATIENT GOALS:   Goals Addressed             This Visit's Progress    Track and Manage My Blood Pressure-Hypertension       Timeframe:  Long-Range Goal Priority:  High Start Date:    04/26/2021                         Expected End Date:   10/24/2021                    Follow Up Date 10/04/2021   - check blood pressure weekly - choose a place to take my blood pressure (home, clinic or office, retail store) - write blood pressure results in a log or diary    Why is this important?   You won't feel high blood pressure, but it can still hurt your blood vessels.  High blood pressure can cause heart or kidney problems. It can also cause a stroke.  Making lifestyle changes like losing a little weight or eating less salt will help.  Checking your blood pressure at home and at different times of the day can help to control blood pressure.  If the doctor prescribes medicine remember to take it the way the doctor ordered.  Call the office if you cannot afford the medicine or if there are questions about it.          Consent to CCM Services: Roger Ingram was given information about Chronic Care Management services including:  CCM service includes personalized support from designated clinical staff supervised by his physician, including individualized plan of care and coordination with other care providers 24/7 contact phone numbers for assistance for urgent and routine care needs. Service will only be billed when office clinical staff spend 20 minutes or more in a month to coordinate care. Only one practitioner may furnish and bill the service in a calendar month. The patient may stop CCM services at any time (effective at the end of the month) by phone call to the office staff. The patient will be responsible for cost sharing (co-pay) of up to 20% of the service fee  (after annual deductible is met).  Patient agreed to services and verbal consent obtained.   The patient verbalized understanding of instructions, educational materials, and care plan provided today and declined offer to receive copy of patient instructions, educational materials, and care plan.   Telephone follow up appointment with care management team member scheduled for: 4 months The patient has been provided with contact information for the care management team and has been advised to call with any health related questions or concerns.   Tomasa Blase, PharmD Clinical Pharmacist, [place]   CLINICAL CARE PLAN: Patient Care Plan: CCM Care Plan     Problem Identified: Hypertension, Prediabetes, BPH , Tremors, and Vertigo   Priority: High  Onset Date: 04/26/2021     Long-Range Goal: Disease Management   Start Date: 04/26/2021  Expected End Date: 10/24/2021  This Visit's Progress: On track  Priority: High  Note:   Current Barriers:  Unable to independently monitor therapeutic efficacy  Pharmacist Clinical Goal(s):  Patient will verbalize ability to afford treatment regimen maintain control of blood pressure, tremors, vertigo, BPH as evidenced by blood pressure logs, tremor, vertigo frequency, and urinary  symptoms  through collaboration with PharmD and provider.   Interventions: 1:1 collaboration with Binnie Rail, MD regarding development and update of comprehensive plan of care as evidenced by provider attestation and co-signature Inter-disciplinary care team collaboration (see longitudinal plan of care) Comprehensive medication review performed; medication list updated in electronic medical record  Hypertension (BP goal <140/90) -Controlled -Current treatment: Amlodipine-Benazepril 5-65m - 1 capsule daily Propanolol 158m- 1 tablet twice daily (primarily for tremor) -Medications previously tried: n/a  Previously taking lotrel 10-2089m lowered due to  side effects  -Current home readings: 135-140/60 BP Readings from Last 3 Encounters:  01/14/21 140/72  09/10/20 132/68  12/09/19 130/63  -Current dietary habits: following low sodium diet  -Current exercise habits: walking in park twice daily, 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 -Denies hypotensive/hypertensive symptoms -Educated on BP goals and benefits of medications for prevention of heart attack, stroke and kidney damage; Daily salt intake goal < 2300 mg; Exercise goal of 150 minutes per week; Importance of home blood pressure monitoring; Proper BP monitoring technique; -Counseled to monitor BP at home at least once weekly, document, and provide log at future appointments -Counseled on diet and exercise extensively Recommended to continue current medication  Prediabetes (A1c goal <6.5%) -Controlled Lab Results  Component Value Date   HGBA1C 5.9 09/10/2020  -Current medications: N/a - diet controlled  -Medications previously tried: n/a  hypoglycemic/hyperglycemic symptoms -Current meal patterns:  Reviewed with patient about moderation of carbohydrates, patient reports to diet that is high in lean protein and vegetables -Current exercise: walking in park twice daily, 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 -Educated on A1c and blood sugar goals; Complications of diabetes including kidney damage, retinal damage, and cardiovascular disease; Carbohydrate counting and/or plate method -Counseled to check feet daily and get yearly eye exams -Counseled on diet and exercise extensively  Tremor (Goal: Control of tremor) -Controlled -Current treatment  Propranolol 25m82m1 tablet twice daily  -Medications previously tried: gabapentin   -Recommended to continue current medication  Vertigo (Goal: Prevention of dizziness/nausea) -Controlled -Current treatment  Meclizine 12.5mg 48m-2 tablet 3 times daily as needed  -Medications previously tried: n/a  -Recommended  to continue current medication  BPH (Goal: prevention of nocturia/ urinary symptoms) -Controlled -Current treatment  Tamsulosin 0.4mg -36mcapsule daily  Finasteride 5mg - 23mablet daily  -Medications previously tried: n/a  -Recommended to continue current medication - Patient reports that he is no longer following with urologist - offered referral to urology which he declined at this time   Health Maintenance -Vaccine gaps: Influenza, shingles, COVID booster, patient declined at this time  -Current therapy:  Aspirin 81mg - 52mblet daily  Folic acid 400mcg -854OEVlet daily  -Educated on Cost vs benefit of each product must be carefully weighed by individual consumer -Patient is satisfied with current therapy and denies issues -Recommended to continue current medication  Patient Goals/Self-Care Activities Patient will:  - take medications as prescribed check blood pressure once weekly, document, and provide at future appointments target a minimum of 150 minutes of moderate intensity exercise weekly  Follow Up Plan: Telephone follow up appointment with care management team member scheduled for: 4 months The patient has been provided with contact information for the care management team and has been provided with contact information for the care management team and has been advised to call with any health related questions or concerns.

## 2021-05-02 ENCOUNTER — Other Ambulatory Visit: Payer: Self-pay | Admitting: Internal Medicine

## 2021-05-02 MED ORDER — MECLIZINE HCL 12.5 MG PO TABS: 12.5000 mg | ORAL_TABLET | Freq: Three times a day (TID) | ORAL | 0 refills | Status: DC | PRN

## 2021-05-03 ENCOUNTER — Telehealth: Payer: Self-pay

## 2021-05-03 NOTE — Telephone Encounter (Signed)
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

## 2021-05-12 ENCOUNTER — Telehealth: Payer: Self-pay | Admitting: Internal Medicine

## 2021-05-12 NOTE — Telephone Encounter (Signed)
Needs VV

## 2021-05-12 NOTE — Telephone Encounter (Signed)
Spoke with patient today.  He declined virtual visit and said he would try OTC meds since he wasn't really feeling to bad.  He has been advised to see appointment if he doesn't get better.  Patient voiced understanding

## 2021-05-12 NOTE — Telephone Encounter (Signed)
    Patient is calling to report he DOES NOT have covid. He read the results wrong

## 2021-05-12 NOTE — Telephone Encounter (Signed)
Patient calling in COVID+ 09.28.22 (at home test)  Sore throat, tightness in chest  Requesting antiviral be sent to pharmacy:  [facility], [place]  Phone:  [phone] Fax:  [phone]

## 2021-05-13 ENCOUNTER — Ambulatory Visit (INDEPENDENT_AMBULATORY_CARE_PROVIDER_SITE_OTHER): Payer: Medicare Other | Admitting: Nurse Practitioner

## 2021-05-13 ENCOUNTER — Other Ambulatory Visit: Payer: Self-pay | Admitting: Internal Medicine

## 2021-05-13 ENCOUNTER — Encounter: Payer: Self-pay | Admitting: Nurse Practitioner

## 2021-05-13 ENCOUNTER — Other Ambulatory Visit: Payer: Self-pay

## 2021-05-13 VITALS — BP 145/70 | Temp 100.6°F

## 2021-05-13 DIAGNOSIS — U071 COVID-19: Secondary | ICD-10-CM

## 2021-05-13 MED ORDER — MOLNUPIRAVIR EUA 200MG CAPSULE: 4.0000 | ORAL_CAPSULE | Freq: Two times a day (BID) | ORAL | 0 refills | Status: AC

## 2021-05-13 MED ORDER — MECLIZINE HCL 12.5 MG PO TABS: 12.5000 mg | ORAL_TABLET | Freq: Three times a day (TID) | ORAL | 1 refills | Status: AC | PRN

## 2021-05-13 NOTE — Progress Notes (Signed)
Due to national recommendations of social distancing related to the [place] pandemic, an audio-only tele-health visit was felt to be the most appropriate encounter type for this patient today. I connected with  Roger Ingram on 05/13/21 utilizing audio-only technology and verified that I am speaking with the correct person using two identifiers. The patient was located at their home, and I was located at the office of [place] at [facility] during the encounter. I discussed the limitations of evaluation and management by telemedicine. The patient expressed understanding and agreed to proceed.    Subjective:  Patient ID: Roger Ingram, male    DOB: [birth year]  Age: 87 y.o. MRN: [id number]  CC:  Chief Complaint  Patient presents with   Covid 19      HPI  This patient arrives today for virtual visit for the above.  He tells me he tested positive for COVID-19 on an at home test.  He tells me his symptoms started approximately 3 days ago.  He was exposed to it by family members.  He is running a low-grade fever at 100.6 F.  He is experiencing congestion, productive cough, and nasal congestion.  Otherwise he is feeling okay so far.  He is 87 years of age and does have past medical history of hypertension as well as prediabetes.  He has not been taking anything over-the-counter to treat his symptoms currently.  Past Medical History:  Diagnosis Date   Basal cell cancer    BPH (benign prostatic hypertrophy)    Dr Junious Silk   Chronic kidney disease    Hx of clear fluid sacs on both kidneys. Drained about 2006   Diverticulosis of colon    Elevated PSA    [facility]   History of kidney stones    Hx of colonic polyps    Hyperlipidemia    Hypertension    Melanoma ([place])       Family History  Problem Relation Age of Onset   Colon cancer Brother 21   Other Sister        addisons disease   Dementia Sister    Stroke Brother    Stroke Mother 69   Heart attack Father  69   Stroke Father    Stroke Paternal Grandfather        in 74s   Diabetes Son        IDDM   Crohn's disease Daughter    Drug abuse Son     Social History   Social History Narrative   Walks twice daily for exercise   Pt lives at home with spouse 1 story home- he has 3 children 2 which are living   Pt can use both hands to write   Social History   Tobacco Use   Smoking status: Never   Smokeless tobacco: Never  Substance Use Topics   Alcohol use: No    Alcohol/week: 0.0 standard drinks     Current Meds  Medication Sig   amLODipine-benazepril (LOTREL) 5-10 MG capsule Take 1 capsule by mouth once daily   aspirin EC 81 MG tablet Take 81 mg by mouth daily.   finasteride (PROSCAR) 5 MG tablet Take 1 tablet by mouth once daily   folic acid (FOLVITE) 315 MCG tablet Take 400 mcg by mouth daily.   molnupiravir EUA (LAGEVRIO) 200 mg CAPS capsule Take 4 capsules (800 mg total) by mouth 2 (two) times daily for 5 days.   propranolol (INDERAL) 10 MG tablet  TAKE 1 TABLET BY MOUTH TWICE DAILY FOR ESSENTIAL TREMOR (Patient taking differently: Take 10 mg by mouth 2 (two) times daily as needed.)   [DISCONTINUED] meclizine (ANTIVERT) 12.5 MG tablet Take 1-2 tablets (12.5-25 mg total) by mouth 3 (three) times daily as needed for dizziness.   [DISCONTINUED] tamsulosin (FLOMAX) 0.4 MG CAPS capsule Take 1 capsule (0.4 mg total) by mouth daily.    ROS:  Review of Systems  Constitutional:  Positive for fever and malaise/fatigue.  HENT:  Positive for congestion and sinus pain.   Respiratory:  Positive for cough and sputum production. Negative for shortness of breath and wheezing.   Cardiovascular:  Negative for chest pain.  Gastrointestinal:  Negative for abdominal pain, diarrhea, nausea and vomiting.  Musculoskeletal:  Positive for neck pain. Negative for back pain and myalgias.  Neurological:  Negative for headaches.    Objective:   Today's Vitals: BP (!) 145/70   Temp (!) 100.6 F (38.1  C)  Vitals with BMI 05/13/2021 01/14/2021 09/10/2020  Height - 5\' 8"  5\' 8"   Weight - 151 lbs 10 oz 153 lbs 3 oz  BMI - 09.81 19.1  Systolic 478 295 621  Diastolic 70 72 68  Pulse - 52 60     Physical Exam Comprehensive physical exam not completed today as office visit was conducted remotely.  Comprehensive physical exam not completed as office visit was conducted remotely.  He did sound fairly well over the phone, no coughing noted during visit, he was able to speak in complete sentences without having to stop to breathe..  Patient was alert and oriented, and appeared to have appropriate judgment.       Assessment and Plan   1. COVID-19      Plan: 1.  Based on his chronic kidney disease will prescribe molnupiravir for 5 days.  I also recommended he take Tylenol as needed for fever and/or body aches.  I also recommended he focus on hydrating regularly throughout the day.  We also discussed that he should get up and walk within his home a few times a day as tolerated.  He was told to quarantine at home for at least the next 2 days, if symptoms are okay 2 days from now he can go back into the community but should wear a mask if he goes out in public.  He endorses his understanding.  We did discuss if symptoms worsen drastically he needs to proceed to the emergency department.  I do find this for him as high fever as well as shortness of breath.   Tests ordered No orders of the defined types were placed in this encounter.     Meds ordered this encounter  Medications   molnupiravir EUA (LAGEVRIO) 200 mg CAPS capsule    Sig: Take 4 capsules (800 mg total) by mouth 2 (two) times daily for 5 days.    Dispense:  40 capsule    Refill:  0    Order Specific Question:   Supervising Provider    Answer:   BURNS, Claudina Lick [[id number]]   meclizine (ANTIVERT) 12.5 MG tablet    Sig: Take 1-2 tablets (12.5-25 mg total) by mouth 3 (three) times daily as needed for dizziness.    Dispense:  40 tablet     Refill:  1    Order Specific Question:   Supervising Provider    Answer:   Binnie Rail [id number]    Patient to follow-up as needed.  Total time spent the telephone  today was 16 minutes and 44 seconds.  Ailene Ards, NP

## 2021-05-14 DIAGNOSIS — N400 Enlarged prostate without lower urinary tract symptoms: Secondary | ICD-10-CM

## 2021-05-14 DIAGNOSIS — I10 Essential (primary) hypertension: Secondary | ICD-10-CM

## 2021-06-02 ENCOUNTER — Other Ambulatory Visit: Payer: Self-pay | Admitting: Internal Medicine

## 2021-06-03 ENCOUNTER — Telehealth: Payer: Self-pay | Admitting: Internal Medicine

## 2021-06-03 DIAGNOSIS — N1831 Chronic kidney disease, stage 3a: Secondary | ICD-10-CM

## 2021-06-03 DIAGNOSIS — N183 Chronic kidney disease, stage 3 unspecified: Secondary | ICD-10-CM | POA: Insufficient documentation

## 2021-06-03 NOTE — Telephone Encounter (Signed)
Rx d/c by Jeralyn Ruths on 05/13/21 for patient preference

## 2021-06-03 NOTE — Telephone Encounter (Signed)
Patient requesting advise on a kidney specialist   Patient requesting a call back

## 2021-06-03 NOTE — Telephone Encounter (Signed)
Referral ordered

## 2021-06-04 ENCOUNTER — Other Ambulatory Visit: Payer: Self-pay

## 2021-06-04 ENCOUNTER — Telehealth: Payer: Self-pay | Admitting: Internal Medicine

## 2021-06-04 ENCOUNTER — Other Ambulatory Visit: Payer: Self-pay | Admitting: Internal Medicine

## 2021-06-04 MED ORDER — TAMSULOSIN HCL 0.4 MG PO CAPS: 0.4000 mg | ORAL_CAPSULE | Freq: Every day | ORAL | 0 refills | Status: DC

## 2021-06-04 NOTE — Telephone Encounter (Signed)
Pharmacy requesting approval for refill on Flomax  Pharmacist stated last filled date for rx was 02-18-2021  Advised pharmacist I did not see medication on patients' current med list  Please advise   [place] [phone]

## 2021-06-04 NOTE — Telephone Encounter (Signed)
Sent in for patient today.  Medication previously removed due to patient preference but added back to list and sent in.

## 2021-06-17 DIAGNOSIS — Z23 Encounter for immunization: Secondary | ICD-10-CM | POA: Diagnosis not present

## 2021-06-17 NOTE — Progress Notes (Signed)
Subjective:    Patient ID: Roger Ingram, male    DOB: [birth year], 87 y.o.   MRN: [id number]  This visit occurred during the SARS-CoV-2 public health emergency.  Safety protocols were in place, including screening questions prior to the visit, additional usage of staff PPE, and extensive cleaning of exam room while observing appropriate contact time as indicated for disinfecting solutions.    HPI The patient is here for an acute visit.  Lump behind right knee - 3-4 weeks ago the area was painful - the pain lasted about a day and a half and he has not had any pain since then.  At that time he did not notice any skin changes, swelling.  He is unsure why he had pain.  He noted the lump behind his right medial knee 2-3 days ago. It does not hurt.  No skin changes.  No calf pain.  No leg swelling.   He denies right knee OA.    Medications and allergies reviewed with patient and updated if appropriate.  Patient Active Problem List   Diagnosis Date Noted   CKD (chronic kidney disease) stage 3, GFR 30-59 ml/min (HCC) 06/03/2021   Laceration of arm, left, initial encounter 01/14/2021   Wrist pain, acute, left 01/14/2021   Neck pain 01/14/2021   Hyperkalemia 09/09/2020   Neck pain, musculoskeletal 06/16/2020   Other peripheral vertigo, unspecified ear 05/24/2019   Chronic left SI joint pain 08/03/2018   Diverticulitis large intestine 02/08/2018   Abdominal pain 01/31/2018   Osteoarthritis of left hip 12/19/2014   Status post total replacement of left hip 12/19/2014   Degenerative joint disease (DJD) of hip 11/13/2014   Popliteal cyst, unruptured 06/17/2014   DDD (degenerative disc disease), lumbosacral 10/26/2013   Essential hypertension 07/06/2009   Prediabetes 10/23/2008   Hyperlipidemia 10/02/2008   Essential tremor 10/02/2008   Diverticulosis of large intestine 10/02/2008   NEURITIS 10/02/2008   MALIGNANT MELANOMA, HX OF 10/02/2008   SKIN CANCER, HX OF 10/02/2008   History of  colonic polyps 10/02/2008   BPH (benign prostatic hyperplasia) 06/02/2008    Current Outpatient Medications on File Prior to Visit  Medication Sig Dispense Refill   amLODipine-benazepril (LOTREL) 5-10 MG capsule Take 1 capsule by mouth once daily 90 capsule 0   aspirin EC 81 MG tablet Take 81 mg by mouth daily.     finasteride (PROSCAR) 5 MG tablet Take 1 tablet by mouth once daily 90 tablet 0   folic acid (FOLVITE) 749 MCG tablet Take 400 mcg by mouth daily.     meclizine (ANTIVERT) 12.5 MG tablet Take 1-2 tablets (12.5-25 mg total) by mouth 3 (three) times daily as needed for dizziness. 40 tablet 1   propranolol (INDERAL) 10 MG tablet TAKE 1 TABLET BY MOUTH TWICE DAILY FOR ESSENTIAL TREMOR (Patient taking differently: Take 10 mg by mouth 2 (two) times daily as needed.) 180 tablet 0   tamsulosin (FLOMAX) 0.4 MG CAPS capsule Take 1 capsule by mouth once daily 90 capsule 0   tamsulosin (FLOMAX) 0.4 MG CAPS capsule Take 1 capsule (0.4 mg total) by mouth daily. 90 capsule 0   No current facility-administered medications on file prior to visit.    Past Medical History:  Diagnosis Date   Basal cell cancer    BPH (benign prostatic hypertrophy)    Dr Junious Silk   Chronic kidney disease    Hx of clear fluid sacs on both kidneys. Drained about 2006   Diverticulosis of colon  Elevated PSA    [facility]   History of kidney stones    Hx of colonic polyps    Hyperlipidemia    Hypertension    Melanoma [facility] [place])     Past Surgical History:  Procedure Laterality Date   basal cell     R  & L temple; Dr Allyson Sabal   COLONOSCOPY W/ POLYPECTOMY  1997, 2011   Dr Earlean Shawl   melanoma     L forehead   Right rotator cuff sx     TOTAL HIP ARTHROPLASTY Left 12/19/2014   Procedure: LEFT TOTAL HIP ARTHROPLASTY ANTERIOR APPROACH;  Surgeon: Mcarthur Rossetti, MD;  Location: [facility];  Service: Orthopedics;  Laterality: Left;    Social History   Socioeconomic History   Marital status: Married     Spouse name: Not on file   Number of children: 3   Years of education: Not on file   Highest education level: Professional school degree (e.g., MD, DDS, DVM, JD)  Occupational History   Occupation: retired    Comment: Insurance underwriter  Tobacco Use   Smoking status: Never   Smokeless tobacco: Never  Vaping Use   Vaping Use: Never used  Substance and Sexual Activity   Alcohol use: No    Alcohol/week: 0.0 standard drinks   Drug use: No   Sexual activity: Not on file  Other Topics Concern   Not on file  Social History Narrative   Walks twice daily for exercise   Pt lives at home with spouse 1 story home- he has 3 children 2 which are living   Pt can use both hands to write   Social Determinants of Health   Financial Resource Strain: Not on file  Food Insecurity: Not on file  Transportation Needs: Not on file  Physical Activity: Not on file  Stress: Not on file  Social Connections: Not on file    Family History  Problem Relation Age of Onset   Colon cancer Brother 32   Other Sister        addisons disease   Dementia Sister    Stroke Brother    Stroke Mother 35   Heart attack Father 60   Stroke Father    Stroke Paternal Grandfather        in 47s   Diabetes Son        IDDM   Crohn's disease Daughter    Drug abuse Son     Review of Systems  Constitutional:  Negative for fever.  Cardiovascular:  Negative for leg swelling.  Musculoskeletal:  Negative for myalgias.  Skin:  Negative for color change and rash.  Neurological:  Negative for weakness and numbness.      Objective:   Vitals:   06/18/21 0832  BP: 138/70  Pulse: 71  Temp: 98.4 F (36.9 C)  SpO2: 98%   BP Readings from Last 3 Encounters:  06/18/21 138/70  05/13/21 (!) 145/70  01/14/21 140/72   Wt Readings from Last 3 Encounters:  06/18/21 149 lb (67.6 kg)  01/14/21 151 lb 9.6 oz (68.8 kg)  09/10/20 153 lb 3.2 oz (69.5 kg)   Body mass index is 22.66 kg/m.   Physical Exam Constitutional:       General: He is not in acute distress.    Appearance: Normal appearance. He is not ill-appearing.  HENT:     Head: Normocephalic and atraumatic.  Musculoskeletal:        General: No tenderness (no calf, knee tenderness).  Skin:  General: Skin is warm and dry.     Findings: No bruising, erythema or rash.     Comments: Egg sized lump posterior-medial right knee - non-tender, no fluctuance, non-tender, not mobile - more prominent with right leg extended, less prominent with leg flexed.    Neurological:     Mental Status: He is alert.           Assessment & Plan:    See Problem List for Assessment and Plan of chronic medical problems.

## 2021-06-18 ENCOUNTER — Encounter: Payer: Self-pay | Admitting: Internal Medicine

## 2021-06-18 ENCOUNTER — Ambulatory Visit (INDEPENDENT_AMBULATORY_CARE_PROVIDER_SITE_OTHER): Payer: Medicare Other | Admitting: Internal Medicine

## 2021-06-18 ENCOUNTER — Other Ambulatory Visit: Payer: Self-pay

## 2021-06-18 VITALS — BP 138/70 | HR 71 | Temp 98.4°F | Ht 68.0 in | Wt 149.0 lb

## 2021-06-18 DIAGNOSIS — Z23 Encounter for immunization: Secondary | ICD-10-CM

## 2021-06-18 DIAGNOSIS — I10 Essential (primary) hypertension: Secondary | ICD-10-CM | POA: Diagnosis not present

## 2021-06-18 DIAGNOSIS — R2241 Localized swelling, mass and lump, right lower limb: Secondary | ICD-10-CM | POA: Diagnosis not present

## 2021-06-18 NOTE — Patient Instructions (Addendum)
   Flu immunization administered today.     A ultrasound of your leg was ordered.        Someone from their office will call you to schedule an appointment.

## 2021-06-18 NOTE — Assessment & Plan Note (Signed)
Chronic Blood pressure well controlled Continue amlodipine-benazepril 5-10 mg daily propranolol 10 mg twice daily-he is taking this more for the essential tremor, but this is likely influencing his blood pressure as well

## 2021-06-18 NOTE — Assessment & Plan Note (Addendum)
Acute Skin lump right posterior - medial knee  Had pain in that area a few weeks ago for a little over a day, no skin changes-noticed the lump recently ? Bakers cyst, lipoma, cyst Soft tissue ultrasound ordered of the lower extremity to evaluate further-depending on results we will see if further evaluation is necessary

## 2021-06-23 ENCOUNTER — Ambulatory Visit
Admission: RE | Admit: 2021-06-23 | Discharge: 2021-06-23 | Disposition: A | Discharge status: Home / Self Care | Payer: Medicare Other | Source: Ambulatory Visit | Attending: Internal Medicine | Admitting: Internal Medicine

## 2021-06-23 DIAGNOSIS — R2241 Localized swelling, mass and lump, right lower limb: Secondary | ICD-10-CM | POA: Diagnosis not present

## 2021-06-23 DIAGNOSIS — M7121 Synovial cyst of popliteal space [Baker], right knee: Secondary | ICD-10-CM | POA: Diagnosis not present

## 2021-06-25 ENCOUNTER — Telehealth: Payer: Self-pay | Admitting: Internal Medicine

## 2021-06-25 NOTE — Telephone Encounter (Signed)
Patient requesting a call back to discuss ultra sound results on R. Knee  Informed patient of the findings and impression of results

## 2021-06-26 ENCOUNTER — Encounter: Payer: Self-pay | Admitting: Internal Medicine

## 2021-07-01 NOTE — Telephone Encounter (Signed)
Patient was returning call to discuss results. Relayed the message from . Requesting a call back to discuss treatment plans for the arthritis.   Best contact #: [phone]

## 2021-07-01 NOTE — Telephone Encounter (Signed)
Spoke with patient today.  He states he is fine right now but will call back if he starts to have pain and wants to see Sports Med.

## 2021-07-01 NOTE — Telephone Encounter (Signed)
It depends on his symptoms-if he has minimal to no symptoms nothing needs to be done.  If he is having discomfort and is interested in treatment I can have him see sports medicine.  He can take Tylenol or ibuprofen as needed.  He can use topical medications.  Physical therapy often helps or he can get injections in the knee.

## 2021-07-10 ENCOUNTER — Other Ambulatory Visit: Payer: Self-pay | Admitting: Internal Medicine

## 2021-08-23 DIAGNOSIS — N281 Cyst of kidney, acquired: Secondary | ICD-10-CM | POA: Diagnosis not present

## 2021-08-23 DIAGNOSIS — N400 Enlarged prostate without lower urinary tract symptoms: Secondary | ICD-10-CM | POA: Diagnosis not present

## 2021-08-23 DIAGNOSIS — I129 Hypertensive chronic kidney disease with stage 1 through stage 4 chronic kidney disease, or unspecified chronic kidney disease: Secondary | ICD-10-CM | POA: Diagnosis not present

## 2021-08-23 DIAGNOSIS — N1831 Chronic kidney disease, stage 3a: Secondary | ICD-10-CM | POA: Diagnosis not present

## 2021-08-31 ENCOUNTER — Other Ambulatory Visit: Payer: Self-pay | Admitting: Internal Medicine

## 2021-08-31 NOTE — Telephone Encounter (Signed)
Yes, I agree.  Just need to monitor his BP to make sure it remains controlled.

## 2021-08-31 NOTE — Telephone Encounter (Signed)
Patient states kidney center wants to discontinue amLODipine-benazepril (LOTREL) 5-10 MG capsule and start him on amLODipine-besylate  Patient requesting a call back to discuss provider's advice on medication change  Please advise  Phone [phone]

## 2021-09-01 NOTE — Telephone Encounter (Signed)
Spoke with patient today. 

## 2021-09-12 DIAGNOSIS — R7989 Other specified abnormal findings of blood chemistry: Secondary | ICD-10-CM | POA: Insufficient documentation

## 2021-09-12 NOTE — Progress Notes (Signed)
Subjective:    Patient ID: Roger Ingram, male    DOB: 1928/12/15, 88 y.o.   MRN: [id number]  This visit occurred during the SARS-CoV-2 public health emergency.  Safety protocols were in place, including screening questions prior to the visit, additional usage of staff PPE, and extensive cleaning of exam room while observing appropriate contact time as indicated for disinfecting solutions.     HPI The patient is here for follow up of their chronic medical problems, including htn, essential tremor, BPH, CKD, hld,prediabetes    Medications and allergies reviewed with patient and updated if appropriate.  Patient Active Problem List   Diagnosis Date Noted   Elevated TSH 09/12/2021   Skin lump of leg, right 06/18/2021   CKD (chronic kidney disease) stage 3, GFR 30-59 ml/min (HCC) 06/03/2021   Neck pain 01/14/2021   Hyperkalemia 09/09/2020   Neck pain, musculoskeletal 06/16/2020   Other peripheral vertigo, unspecified ear 05/24/2019   Chronic left SI joint pain 08/03/2018   Diverticulitis large intestine 02/08/2018   Abdominal pain 01/31/2018   Osteoarthritis of left hip 12/19/2014   Status post total replacement of left hip 12/19/2014   Degenerative joint disease (DJD) of hip 11/13/2014   Baker's cyst of knee, right 06/17/2014   DDD (degenerative disc disease), lumbosacral 10/26/2013   Essential hypertension 07/06/2009   Prediabetes 10/23/2008   Hyperlipidemia 10/02/2008   Essential tremor 10/02/2008   Diverticulosis of large intestine 10/02/2008   NEURITIS 10/02/2008   MALIGNANT MELANOMA, HX OF 10/02/2008   SKIN CANCER, HX OF 10/02/2008   History of colonic polyps 10/02/2008   BPH (benign prostatic hyperplasia) 06/02/2008    Current Outpatient Medications on File Prior to Visit  Medication Sig Dispense Refill   aspirin EC 81 MG tablet Take 81 mg by mouth daily.     cholecalciferol (VITAMIN D3) 25 MCG (1000 UNIT) tablet Take 1,000 Units by mouth daily.      finasteride (PROSCAR) 5 MG tablet Take 1 tablet by mouth once daily 90 tablet 0   folic acid (FOLVITE) 109 MCG tablet Take 400 mcg by mouth daily.     meclizine (ANTIVERT) 12.5 MG tablet Take 1-2 tablets (12.5-25 mg total) by mouth 3 (three) times daily as needed for dizziness. 40 tablet 1   propranolol (INDERAL) 10 MG tablet TAKE 1 TABLET BY MOUTH TWICE DAILY FOR ESSENTIAL TREMOR (Patient taking differently: Take 10 mg by mouth 2 (two) times daily as needed.) 180 tablet 0   tamsulosin (FLOMAX) 0.4 MG CAPS capsule Take 1 capsule (0.4 mg total) by mouth daily. 90 capsule 0   No current facility-administered medications on file prior to visit.    Past Medical History:  Diagnosis Date   Basal cell cancer    BPH (benign prostatic hypertrophy)    Dr Junious Silk   Chronic kidney disease    Hx of clear fluid sacs on both kidneys. Drained about 2006   Diverticulosis of colon    Elevated PSA    [facility]   History of kidney stones    Hx of colonic polyps    Hyperlipidemia    Hypertension    Melanoma ([place])     Past Surgical History:  Procedure Laterality Date   basal cell     R  & L temple; Dr Allyson Sabal   COLONOSCOPY W/ POLYPECTOMY  1997, 2011   Dr Earlean Shawl   melanoma     L forehead   Right rotator cuff sx  TOTAL HIP ARTHROPLASTY Left 12/19/2014   Procedure: LEFT TOTAL HIP ARTHROPLASTY ANTERIOR APPROACH;  Surgeon: Mcarthur Rossetti, MD;  Location: [facility];  Service: Orthopedics;  Laterality: Left;    Social History   Socioeconomic History   Marital status: Married    Spouse name: Not on file   Number of children: 3   Years of education: Not on file   Highest education level: Professional school degree (e.g., MD, DDS, DVM, JD)  Occupational History   Occupation: retired    Comment: Insurance underwriter  Tobacco Use   Smoking status: Never   Smokeless tobacco: Never  Vaping Use   Vaping Use: Never used  Substance and Sexual Activity   Alcohol use: No    Alcohol/week: 0.0 standard  drinks   Drug use: No   Sexual activity: Not on file  Other Topics Concern   Not on file  Social History Narrative   Walks twice daily for exercise   Pt lives at home with spouse 1 story home- he has 3 children 2 which are living   Pt can use both hands to write   Social Determinants of Health   Financial Resource Strain: Not on file  Food Insecurity: Not on file  Transportation Needs: Not on file  Physical Activity: Not on file  Stress: Not on file  Social Connections: Not on file    Family History  Problem Relation Age of Onset   Colon cancer Brother 29   Other Sister        addisons disease   Dementia Sister    Stroke Brother    Stroke Mother 73   Heart attack Father 38   Stroke Father    Stroke Paternal Grandfather        in 32s   Diabetes Son        IDDM   Crohn's disease Daughter    Drug abuse Son     Review of Systems  Constitutional:  Negative for chills and fever.  Respiratory:  Positive for shortness of breath (chronic - no change). Negative for cough and wheezing.   Cardiovascular:  Negative for chest pain, palpitations and leg swelling.  Gastrointestinal:  Negative for abdominal pain.  Neurological:  Negative for dizziness, light-headedness and headaches.      Objective:   Vitals:   09/13/21 1304  BP: 120/74  Pulse: (!) 57  Temp: 98 F (36.7 C)  SpO2: 98%   BP Readings from Last 3 Encounters:  09/13/21 120/74  06/18/21 138/70  05/13/21 (!) 145/70   Wt Readings from Last 3 Encounters:  09/13/21 145 lb 12.8 oz (66.1 kg)  06/18/21 149 lb (67.6 kg)  01/14/21 151 lb 9.6 oz (68.8 kg)   Body mass index is 22.17 kg/m.   Physical Exam    Constitutional: Appears well-developed and well-nourished. No distress.  HENT:  Head: Normocephalic and atraumatic.  Neck: Neck supple. No tracheal deviation present. No thyromegaly present.  No cervical lymphadenopathy Cardiovascular: Normal rate, regular rhythm and normal heart sounds.   2/6 systolic  murmur heard. No carotid bruit .  No edema Pulmonary/Chest: Effort normal and breath sounds normal. No respiratory distress. No has no wheezes. No rales.  Skin: Skin is warm and dry. Not diaphoretic.  Psychiatric: Normal mood and affect. Behavior is normal.      Assessment & Plan:    See Problem List for Assessment and Plan of chronic medical problems.

## 2021-09-12 NOTE — Patient Instructions (Addendum)
    Blood work was ordered.      Medications changes include :   none     Please followup in 6 months  

## 2021-09-13 ENCOUNTER — Ambulatory Visit (INDEPENDENT_AMBULATORY_CARE_PROVIDER_SITE_OTHER): Payer: Medicare Other | Admitting: Internal Medicine

## 2021-09-13 ENCOUNTER — Encounter: Payer: Self-pay | Admitting: Internal Medicine

## 2021-09-13 ENCOUNTER — Other Ambulatory Visit: Payer: Self-pay

## 2021-09-13 VITALS — BP 120/74 | HR 57 | Temp 98.0°F | Ht 68.0 in | Wt 145.8 lb

## 2021-09-13 DIAGNOSIS — G250 Essential tremor: Secondary | ICD-10-CM

## 2021-09-13 DIAGNOSIS — I10 Essential (primary) hypertension: Secondary | ICD-10-CM | POA: Diagnosis not present

## 2021-09-13 DIAGNOSIS — E7849 Other hyperlipidemia: Secondary | ICD-10-CM

## 2021-09-13 DIAGNOSIS — N1831 Chronic kidney disease, stage 3a: Secondary | ICD-10-CM

## 2021-09-13 DIAGNOSIS — R7303 Prediabetes: Secondary | ICD-10-CM | POA: Diagnosis not present

## 2021-09-13 DIAGNOSIS — N400 Enlarged prostate without lower urinary tract symptoms: Secondary | ICD-10-CM | POA: Diagnosis not present

## 2021-09-13 DIAGNOSIS — R7989 Other specified abnormal findings of blood chemistry: Secondary | ICD-10-CM

## 2021-09-13 LAB — CBC WITH DIFFERENTIAL/PLATELET
Basophils Absolute: 0 10*3/uL (ref 0.0–0.1)
Basophils Relative: 0.5 % (ref 0.0–3.0)
Eosinophils Absolute: 0.1 10*3/uL (ref 0.0–0.7)
Eosinophils Relative: 1.9 % (ref 0.0–5.0)
HCT: 40.2 % (ref 39.0–52.0)
Hemoglobin: 13.4 g/dL (ref 13.0–17.0)
Lymphocytes Relative: 37.1 % (ref 12.0–46.0)
Lymphs Abs: 2.5 10*3/uL (ref 0.7–4.0)
MCHC: 33.2 g/dL (ref 30.0–36.0)
MCV: 89.2 fl (ref 78.0–100.0)
Monocytes Absolute: 0.6 10*3/uL (ref 0.1–1.0)
Monocytes Relative: 9.7 % (ref 3.0–12.0)
Neutro Abs: 3.4 10*3/uL (ref 1.4–7.7)
Neutrophils Relative %: 50.8 % (ref 43.0–77.0)
Platelets: 197 10*3/uL (ref 150.0–400.0)
RBC: 4.5 Mil/uL (ref 4.22–5.81)
RDW: 13 % (ref 11.5–15.5)
WBC: 6.6 10*3/uL (ref 4.0–10.5)

## 2021-09-13 LAB — COMPREHENSIVE METABOLIC PANEL
ALT: 14 U/L (ref 0–53)
AST: 17 U/L (ref 0–37)
Albumin: 4.2 g/dL (ref 3.5–5.2)
Alkaline Phosphatase: 51 U/L (ref 39–117)
BUN: 31 mg/dL — ABNORMAL HIGH (ref 6–23)
CO2: 26 mEq/L (ref 19–32)
Calcium: 9.5 mg/dL (ref 8.4–10.5)
Chloride: 102 mEq/L (ref 96–112)
Creatinine, Ser: 1.31 mg/dL (ref 0.40–1.50)
GFR: 47.83 mL/min — ABNORMAL LOW (ref >60.00)
Glucose, Bld: 85 mg/dL (ref 70–99)
Potassium: 4.5 mEq/L (ref 3.5–5.1)
Sodium: 136 mEq/L (ref 135–145)
Total Bilirubin: 0.4 mg/dL (ref 0.2–1.2)
Total Protein: 7 g/dL (ref 6.0–8.3)

## 2021-09-13 LAB — LIPID PANEL
Cholesterol: 208 mg/dL — ABNORMAL HIGH (ref 0–200)
HDL: 67.2 mg/dL (ref >39.00)
LDL Cholesterol: 117 mg/dL — ABNORMAL HIGH (ref 0–99)
NonHDL: 140.82
Total CHOL/HDL Ratio: 3
Triglycerides: 120 mg/dL (ref 0.0–149.0)
VLDL: 24 mg/dL (ref 0.0–40.0)

## 2021-09-13 LAB — HEMOGLOBIN A1C: Hgb A1c MFr Bld: 5.9 % (ref 4.6–6.5)

## 2021-09-13 LAB — TSH: TSH: 4.49 u[IU]/mL (ref 0.35–5.50)

## 2021-09-13 MED ORDER — AMLODIPINE BESYLATE 10 MG PO TABS: 10.0000 mg | ORAL_TABLET | Freq: Every day | ORAL | Status: AC

## 2021-09-13 NOTE — Assessment & Plan Note (Signed)
Chronic Check a1c Low sugar / carb diet Stressed regular exercise  

## 2021-09-13 NOTE — Assessment & Plan Note (Signed)
Chronic Stable Following with nephrology

## 2021-09-13 NOTE — Assessment & Plan Note (Signed)
H/o elevated TSH Recheck tsh clinically euthyroid

## 2021-09-13 NOTE — Assessment & Plan Note (Signed)
Chronic Fairly controlled Continue flomax 0.4 mg daily, proscar 5 mg daily

## 2021-09-13 NOTE — Assessment & Plan Note (Signed)
Chronic Lifestyle controlled Check lipids

## 2021-09-13 NOTE — Assessment & Plan Note (Signed)
Chronic Controlled Continue propranolol 10 mg daily

## 2021-09-13 NOTE — Assessment & Plan Note (Addendum)
Chronic BP well controlled Continue amlodipine 10 mg daily cmp  

## 2021-09-17 ENCOUNTER — Telehealth: Payer: Self-pay

## 2021-09-17 NOTE — Telephone Encounter (Signed)
Patient walked in to office this morning wanting someone to go over his labs.  He left and asked it they could be mailed to him.  Placed in mail today.

## 2021-09-17 NOTE — Telephone Encounter (Signed)
Pt called back this morning for his results. I advised pt that Dr. Quay Burow states that his Kidney function slightly decreased but stable. Also that his his liver test, blood count, and Thyroid functions were normal and his sugars are stable in the Prediabetic range.  Pt states that he would like a copy of the labs.   Copy of Labs were placed up front for pick up.

## 2021-09-17 NOTE — Telephone Encounter (Signed)
Called patient and labs discussed today.

## 2021-10-04 ENCOUNTER — Other Ambulatory Visit: Payer: Self-pay | Admitting: Internal Medicine

## 2021-10-04 ENCOUNTER — Telehealth: Payer: Medicare Other
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
## 2021-10-07 ENCOUNTER — Ambulatory Visit (INDEPENDENT_AMBULATORY_CARE_PROVIDER_SITE_OTHER): Payer: Medicare Other

## 2021-10-07 NOTE — Progress Notes (Signed)
Chronic Care Management Pharmacy Note  10/07/2021 Name:  Roger Ingram MRN:  [id number] DOB:  Sep 10, 1928  Summary: - patient notes that nephrology advised to stop benazepril - continued on amlodipine 75m daily, started on vitamin d 1000 units daily -Previously discussed starting statin therapy, but declined  -Continues to monitor BP at home  - reports BP mostly unchanged since change in medication - averaging 140/70 -Reports adherence to current medications, denies any issues or concerns   Recommendations/Changes made from today's visit: - Recommending no changes to medications, patient to continue to monitor blood pressures and reach out should control be lost -advised for patient to have shingrix vaccine - patient will consider   Subjective: Roger SALGUEROis an 88y.o. year old male who is a primary patient of Burns, SClaudina Lick MD.  The CCM team was consulted for assistance with disease management and care coordination needs.    Engaged with patient by telephone for follow up visit in response to provider referral for pharmacy case management and/or care coordination services.   Consent to Services:  The patient was given the following information about Chronic Care Management services today, agreed to services, and gave verbal consent: 1. CCM service includes personalized support from designated clinical staff supervised by the primary care provider, including individualized plan of care and coordination with other care providers 2. 24/7 contact phone numbers for assistance for urgent and routine care needs. 3. Service will only be billed when office clinical staff spend 20 minutes or more in a month to coordinate care. 4. Only one practitioner may furnish and bill the service in a calendar month. 5.The patient may stop CCM services at any time (effective at the end of the month) by phone call to the office staff. 6. The patient will be responsible for cost sharing (co-pay) of up to 20% of  the service fee (after annual deductible is met). Patient agreed to services and consent obtained.  Patient Care Team: BBinnie Rail MD as PCP - General (Internal Medicine) STomasa Blase [facility] (Pharmacist)  Recent office visits:  09/13/2021 - Dr. BQuay Burow- pt to continue amlodipine 171mdaily - stop benazepril - follow up in 6 months  06/18/2021 - Dr. BuQuay Burow flu shot given, ultrasound of leg ordered - no changes to medications  05/13/2021 - SaJeralyn RuthsP - COVID positive - molnupiravir rx'd   Recent consult visits:  11/04/20 DaDanella Sensing Dermatology - No notes available    Hospital visits:  None in previous 6 months  Objective:  Lab Results  Component Value Date   CREATININE 1.31 09/13/2021   BUN 31 (H) 09/13/2021   GFR 47.83 (L) 09/13/2021   GFRNONAA 49 (L) 02/05/2018   GFRAA 57 (L) 02/05/2018   NA 136 09/13/2021   K 4.5 09/13/2021   CALCIUM 9.5 09/13/2021   CO2 26 09/13/2021   GLUCOSE 85 09/13/2021    Lab Results  Component Value Date/Time   HGBA1C 5.9 09/13/2021 01:37 PM   HGBA1C 5.9 09/10/2020 09:52 AM   GFR 47.83 (L) 09/13/2021 01:37 PM   GFR 46.05 (L) 09/10/2020 09:52 AM    Last diabetic Eye exam:  No results found for: HMDIABEYEEXA  Last diabetic Foot exam:  No results found for: HMDIABFOOTEX   Lab Results  Component Value Date   CHOL 208 (H) 09/13/2021   HDL 67.20 09/13/2021   LDLCALC 117 (H) 09/13/2021   LDLDIRECT 115.6 03/05/2013   TRIG 120.0 09/13/2021  CHOLHDL 3 09/13/2021    Hepatic Function Latest Ref Rng & Units 09/13/2021 09/10/2020 09/09/2019  Total Protein 6.0 - 8.3 g/dL 7.0 7.2 6.8  Albumin 3.5 - 5.2 g/dL 4.2 4.2 4.1  AST 0 - 37 U/L _0 ALT 0 - 53 U/L _1 Alk Phosphatase 39 - 117 U/L 51 57 59  Total Bilirubin 0.2 - 1.2 mg/dL 0.4 0.6 0.4  Bilirubin, Direct 0.0 - 0.3 mg/dL - - -    Lab Results  Component Value Date/Time   TSH 4.49 09/13/2021 01:37 PM   TSH 4.95 (H) 09/10/2020 09:52 AM   FREET4 0.8  12/01/2008 09:18 AM    CBC Latest Ref Rng & Units 09/13/2021 09/10/2020 09/09/2019  WBC 4.0 - 10.5 K/uL 6.6 6.1 7.4  Hemoglobin 13.0 - 17.0 g/dL 13.4 14.3 13.3  Hematocrit 39.0 - 52.0 % 40.2 42.9 40.5  Platelets 150.0 - 400.0 K/uL 197.0 213.0 222.0    No results found for: VD25OH  Clinical ASCVD: No  The ASCVD Risk score (Arnett DK, et al., 2019) failed to calculate for the following reasons:   The 2019 ASCVD risk score is only valid for ages 39 to 73    Depression screen PHQ 2/9 09/13/2021 09/10/2020 09/09/2019  Decreased Interest 0 0 0  Down, Depressed, Hopeless 0 0 0  PHQ - 2 Score 0 0 0    Social History   Tobacco Use  Smoking Status Never  Smokeless Tobacco Never   BP Readings from Last 3 Encounters:  09/13/21 120/74  06/18/21 138/70  05/13/21 (!) 145/70   Pulse Readings from Last 3 Encounters:  09/13/21 (!) 57  06/18/21 71  01/14/21 (!) 52   Wt Readings from Last 3 Encounters:  09/13/21 145 lb 12.8 oz (66.1 kg)  06/18/21 149 lb (67.6 kg)  01/14/21 151 lb 9.6 oz (68.8 kg)   BMI Readings from Last 3 Encounters:  09/13/21 22.17 kg/m  06/18/21 22.66 kg/m  01/14/21 23.05 kg/m    Assessment/Interventions: Review of patient past medical history, allergies, medications, health status, including review of consultants reports, laboratory and other test data, was performed as part of comprehensive evaluation and provision of chronic care management services.   SDOH:  (Social Determinants of Health) assessments and interventions performed: Yes  SDOH Screenings   Alcohol Screen: Not on file  Depression (PHQ2-9): Low Risk    PHQ-2 Score: 0  Financial Resource Strain: Not on file  Food Insecurity: Not on file  Housing: Not on file  Physical Activity: Not on file  Social Connections: Not on file  Stress: Not on file  Tobacco Use: Low Risk    Smoking Tobacco Use: Never   Smokeless Tobacco Use: Never   Passive Exposure: Not on file  Transportation Needs: Not on  file    [place]  Allergies  Allergen Reactions   Hydrocodone Other (See Comments)    Double vision / diplopia   Iohexol Other (See Comments)    Syncope post IV dye for renal calculi 1963    Medications Reviewed Today     Reviewed by Tomasa Blase, [facility] (Pharmacist) on 10/07/21 at [place] List Status: <None>   Medication Order Taking? Sig Documenting Provider Last Dose Status Informant  amLODipine (NORVASC) 10 MG tablet [id number] Yes Take 1 tablet (10 mg total) by mouth daily. Binnie Rail, MD Taking Active   aspirin EC 81 MG tablet [id number] Yes Take 81 mg by mouth daily. [provider] Taking Active Self  cholecalciferol (VITAMIN D3) 25 MCG (1000 UNIT) tablet [id number] Yes Take 1,000 Units by mouth daily. [provider] Taking Active   finasteride (PROSCAR) 5 MG tablet [id number] Yes Take 1 tablet by mouth once daily Burns, Claudina Lick, MD Taking Active   folic acid (FOLVITE) 389 MCG tablet [id number] Yes Take 400 mcg by mouth daily. [provider] Taking Active Self  meclizine (ANTIVERT) 12.5 MG tablet [id number] Yes Take 1-2 tablets (12.5-25 mg total) by mouth 3 (three) times daily as needed for dizziness. Ailene Ards, NP Taking Active   propranolol (INDERAL) 10 MG tablet [id number] Yes TAKE 1 TABLET BY MOUTH TWICE DAILY FOR ESSENTIAL TREMOR Burns, Claudina Lick, MD Taking Active   tamsulosin [facility]) 0.4 MG CAPS capsule [id number] Yes Take 1 capsule (0.4 mg total) by mouth daily. Binnie Rail, MD Taking Active             Patient Active Problem List   Diagnosis Date Noted   Elevated TSH 09/12/2021   Skin lump of leg, right 06/18/2021   CKD (chronic kidney disease) stage 3, GFR 30-59 ml/min (HCC) 06/03/2021   Neck pain 01/14/2021   Hyperkalemia 09/09/2020   Neck pain, musculoskeletal 06/16/2020   Other peripheral vertigo, unspecified ear 05/24/2019   Chronic left SI joint pain 08/03/2018   Diverticulitis large intestine 02/08/2018    Osteoarthritis of left hip 12/19/2014   Status post total replacement of left hip 12/19/2014   Degenerative joint disease (DJD) of hip 11/13/2014   Baker's cyst of knee, right 06/17/2014   DDD (degenerative disc disease), lumbosacral 10/26/2013   Essential hypertension 07/06/2009   Prediabetes 10/23/2008   Hyperlipidemia 10/02/2008   Essential tremor 10/02/2008   Diverticulosis of large intestine 10/02/2008   NEURITIS 10/02/2008   MALIGNANT MELANOMA, HX OF 10/02/2008   SKIN CANCER, HX OF 10/02/2008   History of colonic polyps 10/02/2008   BPH (benign prostatic hyperplasia) 06/02/2008    Immunization History  Administered Date(s) Administered   Fluad Quad(high Dose 65+) 09/10/2020, 06/18/2021   Influenza Whole 06/02/2008   Influenza, High Dose Seasonal PF 08/01/2017   PFIZER(Purple Top)SARS-COV-2 Vaccination 09/20/2019, 10/11/2019, 03/15/2020, 09/15/2020   Pneumococcal Conjugate-13 07/29/2015   Pneumococcal Polysaccharide-23 07/29/2016   Tdap 01/14/2021    Conditions to be addressed/monitored:  Hypertension, Prediabetes, BPH , Tremors, and Vertigo   Care Plan : CCM Care Plan  Updates made by Tomasa Blase, RPH since 10/07/2021 12:00 AM     Problem: Hypertension, Prediabetes, BPH , Tremors, and Vertigo   Priority: High  Onset Date: 04/26/2021     Long-Range Goal: Disease Management   Start Date: 04/26/2021  Expected End Date: 10/07/2022  This Visit's Progress: On track  Recent Progress: On track  Priority: High  Note:   Current Barriers:  Unable to independently monitor therapeutic efficacy  Pharmacist Clinical Goal(s):  Patient will verbalize ability to afford treatment regimen maintain control of blood pressure, tremors, vertigo, BPH as evidenced by blood pressure logs, tremor, vertigo frequency, and urinary symptoms  through collaboration with PharmD and provider.   Interventions: 1:1 collaboration with Binnie Rail, MD regarding development and update of  comprehensive plan of care as evidenced by provider attestation and co-signature Inter-disciplinary care team collaboration (see longitudinal plan of care) Comprehensive medication review performed; medication list updated in electronic medical record  Hypertension (BP goal <140/90) -Controlled -Current treatment: Amlodipine 10mg  - 1 capsule daily Propanolol 10mg  - 1 tablet twice daily (primarily for tremor) -Medications previously  tried: amlodipine-benazepril -Current home readings: ~140/70 BP Readings from Last 3 Encounters:  09/13/21 120/74  06/18/21 138/70  05/13/21 (!) 145/70  -Current dietary habits: following low sodium diet  -Current exercise habits: walking in park twice daily, 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 -Denies hypotensive/hypertensive symptoms -Educated on BP goals and benefits of medications for prevention of heart attack, stroke and kidney damage; Daily salt intake goal < 2300 mg; Exercise goal of 150 minutes per week; Importance of home blood pressure monitoring; Proper BP monitoring technique; -Counseled to monitor BP at home at least once weekly, document, and provide log at future appointments -Counseled on diet and exercise extensively Recommended to continue current medication  Prediabetes (A1c goal <6.5%) -Controlled Lab Results  Component Value Date   HGBA1C 5.9 09/13/2021  -Current medications: N/a - diet controlled  -Medications previously tried: n/a  hypoglycemic/hyperglycemic symptoms -Current meal patterns:  Reviewed with patient about moderation of carbohydrates, patient reports to diet that is high in lean protein and vegetables -Current exercise: walking in park twice daily, 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 -Educated on A1c and blood sugar goals; Complications of diabetes including kidney damage, retinal damage, and cardiovascular disease; Carbohydrate counting and/or plate method -Counseled to check feet daily  and get yearly eye exams -Counseled on diet and exercise extensively  Tremor (Goal: Control of tremor) -Controlled -Current treatment  Propranolol 39m - 1 tablet twice daily  -Medications previously tried: gabapentin   -Recommended to continue current medication  Vertigo (Goal: Prevention of dizziness/nausea) -Controlled -Current treatment  Meclizine 12.548m- 1-2 tablet 3 times daily as needed  -Medications previously tried: n/a  -Recommended to continue current medication  BPH (Goal: prevention of nocturia/ urinary symptoms) -Controlled Lab Results  Component Value Date   PSA 1.35 08/01/2016   PSA 1.20 12/22/2009   PSA 1.60 09/01/2008  -Current treatment  Tamsulosin 0.61m37m 1 capsule daily  Finasteride 5mg861m1 tablet daily  -Medications previously tried: n/a  -Recommended to continue current medication  Health Maintenance -Vaccine gaps: shingles, COVID booster, patient declined at this time, but will consider  -Current therapy:  Aspirin 81mg65m tablet daily  Folic acid 400mc520EYEtablet daily  Vitamin D 1000 units daily  -Educated on Cost vs benefit of each product must be carefully weighed by individual consumer -Patient is satisfied with current therapy and denies issues -Recommended to continue current medication  Patient Goals/Self-Care Activities Patient will:  - take medications as prescribed check blood pressure once weekly, document, and provide at future appointments target a minimum of 150 minutes of moderate intensity exercise weekly  Follow Up Plan: Telephone follow up appointment with care management team member scheduled for: 6-12 months The patient has been provided with contact information for the care management team and has been provided with contact information for the care management team and has been advised to call with any health related questions or concerns.         Medication Assistance: None required.  Patient affirms current coverage  meets needs.  [place]  [place] [id number]: [phone] pill box? Yes Pt endorses 100% compliance  Care Plan and Follow Up Patient Decision:  Patient agrees to Care Plan and Follow-up.  Plan: Telephone follow up appointment with care management team member scheduled for:  8 months and The patient has been provided with contact information for the care management team and has been advised to call with any health related questions or concerns.   Tomasa Blase, PharmD Clinical Pharmacist, [place]

## 2021-10-07 NOTE — Patient Instructions (Signed)
Visit Information  Following are the goals we discussed today:   Track and Manage My Blood Pressure   Timeframe:  Long-Range Goal Priority:  High Start Date:    04/26/2021                         Expected End Date:   10/07/2022                  Follow Up Date 06/06/2022   - check blood pressure weekly - choose a place to take my blood pressure (home, clinic or office, retail store) - write blood pressure results in a log or diary    Why is this important?   You won't feel high blood pressure, but it can still hurt your blood vessels.  High blood pressure can cause heart or kidney problems. It can also cause a stroke.  Making lifestyle changes like losing a little weight or eating less salt will help.  Checking your blood pressure at home and at different times of the day can help to control blood pressure.  If the doctor prescribes medicine remember to take it the way the doctor ordered.  Call the office if you cannot afford the medicine or if there are questions about it.  Plan: Telephone follow up appointment with care management team member scheduled for:  8 months The patient has been provided with contact information for the care management team and has been advised to call with any health related questions or concerns.   Tomasa Blase, PharmD Clinical Pharmacist, [facility]   Please call the care guide team at [phone] if you need to cancel or reschedule your appointment.   Patient verbalizes understanding of instructions and care plan provided today and agrees to view in [place]. Active MyChart status confirmed with patient.

## 2021-10-12 DIAGNOSIS — I10 Essential (primary) hypertension: Secondary | ICD-10-CM

## 2021-10-12 DIAGNOSIS — N400 Enlarged prostate without lower urinary tract symptoms: Secondary | ICD-10-CM | POA: Diagnosis not present

## 2021-12-06 ENCOUNTER — Other Ambulatory Visit: Payer: Self-pay | Admitting: Internal Medicine
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
## 2021-12-08 ENCOUNTER — Ambulatory Visit (INDEPENDENT_AMBULATORY_CARE_PROVIDER_SITE_OTHER): Payer: Medicare Other | Admitting: Neurology

## 2021-12-08 ENCOUNTER — Encounter: Payer: Self-pay | Admitting: Neurology

## 2021-12-08 VITALS — BP 118/79 | HR 61 | Ht 68.0 in | Wt 146.8 lb

## 2021-12-08 DIAGNOSIS — G250 Essential tremor: Secondary | ICD-10-CM | POA: Diagnosis not present

## 2021-12-08 MED ORDER — PRIMIDONE 50 MG PO TABS: 50.0000 mg | ORAL_TABLET | Freq: Two times a day (BID) | ORAL | 1 refills | Status: DC

## 2021-12-08 NOTE — Patient Instructions (Signed)
Start primidone 50 mg - 1/2 tablet at bedtime for 1 week and then increase to 1 tablet at bedtime for one week and then take 1 tablet twice per day thereafter.  This is still a low dosage and I may need to increase it next visit.   ? ?The physicians and staff at [facility] are committed to providing excellent care. You may receive a survey requesting feedback about your experience at our office. We strive to receive "very good" responses to the survey questions. If you feel that your experience would prevent you from giving the office a "very good " response, please contact our office to try to remedy the situation. We may be reached at [phone]. Thank you for taking the time out of your busy day to complete the survey. ? ? ?

## 2021-12-16 DIAGNOSIS — H26492 Other secondary cataract, left eye: Secondary | ICD-10-CM | POA: Diagnosis not present

## 2021-12-16 DIAGNOSIS — Z961 Presence of intraocular lens: Secondary | ICD-10-CM | POA: Diagnosis not present

## 2021-12-16 DIAGNOSIS — H35373 Puckering of macula, bilateral: Secondary | ICD-10-CM | POA: Diagnosis not present

## 2021-12-16 DIAGNOSIS — H5203 Hypermetropia, bilateral: Secondary | ICD-10-CM | POA: Diagnosis not present

## 2021-12-17 ENCOUNTER — Other Ambulatory Visit: Payer: Self-pay | Admitting: Internal Medicine

## 2022-03-07 ENCOUNTER — Other Ambulatory Visit: Payer: Self-pay | Admitting: Internal Medicine

## 2022-03-07 DIAGNOSIS — R351 Nocturia: Secondary | ICD-10-CM | POA: Diagnosis not present

## 2022-03-07 DIAGNOSIS — R3915 Urgency of urination: Secondary | ICD-10-CM | POA: Diagnosis not present

## 2022-03-07 DIAGNOSIS — N401 Enlarged prostate with lower urinary tract symptoms: Secondary | ICD-10-CM | POA: Diagnosis not present

## 2022-03-07 DIAGNOSIS — R3914 Feeling of incomplete bladder emptying: Secondary | ICD-10-CM | POA: Diagnosis not present

## 2022-03-14 ENCOUNTER — Other Ambulatory Visit: Payer: Self-pay | Admitting: Internal Medicine

## 2022-03-18 NOTE — Progress Notes (Signed)
Assessment/Plan:    1.  Essential Tremor  -He will continue on primidone, 50 mg nightly.  He has been on much higher dosages in the past, but thinks he is doing very well.  He is definitely doing better on primidone and then he did off of it.  -I would not increase the propranolol given current bradycardia.  -Follow-up 1 year.  I will be happy to see him back sooner should he want to increase his medication or have more difficulties before then.  Subjective:   Roger Ingram was seen today in follow up for essential tremor.  My previous records were reviewed prior to todays visit.  When I saw the patient in April, we decided to go ahead and restart the primidone and work to 50 mg twice per day.  This was because his tremor had gotten worse when he was off of the primidone.  He reports today that he didn't realize he was supposed to take it bid.  He has only been taking it qday and thinks he is doing okay with that.  He does report he [place] in the park recently.  He got off the trail and tripped over a root and fell.    Current prescribed movement disorder medications: Propranolol, 10 mg twice per day Primidone, 50 mg twice per day (started last visit)   ALLERGIES:   Allergies  Allergen Reactions   Hydrocodone Other (See Comments)    Double vision / diplopia   Iohexol Other (See Comments)    Syncope post IV dye for renal calculi 1963    CURRENT MEDICATIONS:  Current Meds  Medication Sig   amLODipine (NORVASC) 10 MG tablet Take 1 tablet (10 mg total) by mouth daily.   aspirin EC 81 MG tablet Take 81 mg by mouth daily.   cholecalciferol (VITAMIN D3) 25 MCG (1000 UNIT) tablet Take 1,000 Units by mouth daily.   finasteride (PROSCAR) 5 MG tablet Take 1 tablet by mouth once daily   folic acid (FOLVITE) 569 MCG tablet Take 400 mcg by mouth daily.   meclizine (ANTIVERT) 12.5 MG tablet Take 1-2 tablets (12.5-25 mg total) by mouth 3 (three) times daily as needed for dizziness.   primidone  (MYSOLINE) 50 MG tablet Take 1 tablet (50 mg total) by mouth 2 (two) times daily.   propranolol (INDERAL) 10 MG tablet TAKE 1 TABLET BY MOUTH TWICE DAILY FOR ESSENTIAL TREMOR   tamsulosin (FLOMAX) 0.4 MG CAPS capsule Take 1 capsule (0.4 mg total) by mouth daily. Follow-up appt is due must see provider for future refills      Objective:    PHYSICAL EXAMINATION:    VITALS:   Vitals:   03/21/22 1351  BP: 138/60  Pulse: (!) 56  SpO2: 98%  Weight: 145 lb 9.6 oz (66 kg)  Height: '5\' 8"'$  (1.727 m)     GEN:  The patient appears stated age and is in NAD. HEENT:  Normocephalic, atraumatic.  The mucous membranes are moist. The superficial temporal arteries are without ropiness or tenderness. CV: Bradycardic.  Regular. Lungs:  CTAB Neck/HEME:  There are no carotid bruits bilaterally.  Neurological examination:  Orientation: The patient is alert and oriented x3. Cranial nerves: There is good facial symmetry. The speech is fluent and clear. Soft palate rises symmetrically and there is no tongue deviation. Hearing is intact to conversational tone. Sensation: Sensation is intact to light touch throughout Motor: Strength is at least antigravity x4.  Movement examination: Tone: There is  normal tone in the UE/LE Abnormal movements: He has mild to moderate tremor of the outstretched hands.  Archimedes spirals demonstrate moderate tremor, but it is markedly improved compared to last visit, both in the left and right hand. Coordination:  There is no decremation with RAM's Gait and Station: The patient ambulates well.   Chemistry      Component Value Date/Time   NA 136 09/13/2021 1337   K 4.5 09/13/2021 1337   CL 102 09/13/2021 1337   CO2 26 09/13/2021 1337   BUN 31 (H) 09/13/2021 1337   CREATININE 1.31 09/13/2021 1337      Component Value Date/Time   CALCIUM 9.5 09/13/2021 1337   ALKPHOS 51 09/13/2021 1337   AST 17 09/13/2021 1337   ALT 14 09/13/2021 1337   BILITOT 0.4 09/13/2021 1337       Lab Results  Component Value Date   WBC 6.6 09/13/2021   HGB 13.4 09/13/2021   HCT 40.2 09/13/2021   MCV 89.2 09/13/2021   PLT 197.0 09/13/2021   Lab Results  Component Value Date   TSH 4.49 09/13/2021     Chemistry      Component Value Date/Time   NA 136 09/13/2021 1337   K 4.5 09/13/2021 1337   CL 102 09/13/2021 1337   CO2 26 09/13/2021 1337   BUN 31 (H) 09/13/2021 1337   CREATININE 1.31 09/13/2021 1337      Component Value Date/Time   CALCIUM 9.5 09/13/2021 1337   ALKPHOS 51 09/13/2021 1337   AST 17 09/13/2021 1337   ALT 14 09/13/2021 1337   BILITOT 0.4 09/13/2021 1337         Total time spent on today's visit was 20 minutes, including both face-to-face time and nonface-to-face time.  Time included that spent on review of records (prior notes available to me/labs/imaging if pertinent), discussing treatment and goals, answering patient's questions and coordinating care.  Cc:  Binnie Rail, MD/

## 2022-03-21 ENCOUNTER — Ambulatory Visit (INDEPENDENT_AMBULATORY_CARE_PROVIDER_SITE_OTHER): Payer: Medicare Other | Admitting: Neurology

## 2022-03-21 ENCOUNTER — Encounter: Payer: Self-pay | Admitting: Neurology

## 2022-03-21 VITALS — BP 138/60 | HR 56 | Ht 68.0 in | Wt 145.6 lb

## 2022-03-21 DIAGNOSIS — G250 Essential tremor: Secondary | ICD-10-CM

## 2022-03-21 NOTE — Patient Instructions (Signed)
You look great today!  No changes in your medication  The physicians and staff at [facility] are committed to providing excellent care. You may receive a survey requesting feedback about your experience at our office. We strive to receive "very good" responses to the survey questions. If you feel that your experience would prevent you from giving the office a "very good " response, please contact our office to try to remedy the situation. We may be reached at [phone]. Thank you for taking the time out of your busy day to complete the survey.

## 2022-04-04 ENCOUNTER — Other Ambulatory Visit: Payer: Self-pay | Admitting: Internal Medicine

## 2022-04-12 ENCOUNTER — Encounter: Payer: Self-pay | Admitting: Internal Medicine

## 2022-04-12 NOTE — Progress Notes (Unsigned)
Subjective:    Patient ID: Roger Ingram, male    DOB: Jul 12, 1929, 88 y.o.   MRN: [id number]     HPI Roger Ingram is here for follow up of his chronic medical problems, including HTN, HLD, CKD, prediabetes, tremor, BPH  He is active.  He walks his dog twice a day.  He is eating well.    Medications and allergies reviewed with patient and updated if appropriate.  Current Outpatient Medications on File Prior to Visit  Medication Sig Dispense Refill   amLODipine (NORVASC) 10 MG tablet Take 1 tablet (10 mg total) by mouth daily. 90 tablet    aspirin EC 81 MG tablet Take 81 mg by mouth daily.     cholecalciferol (VITAMIN D3) 25 MCG (1000 UNIT) tablet Take 1,000 Units by mouth daily.     finasteride (PROSCAR) 5 MG tablet Take 1 tablet by mouth once daily 90 tablet 2   folic acid (FOLVITE) 923 MCG tablet Take 400 mcg by mouth daily.     meclizine (ANTIVERT) 12.5 MG tablet Take 1-2 tablets (12.5-25 mg total) by mouth 3 (three) times daily as needed for dizziness. 40 tablet 1   primidone (MYSOLINE) 50 MG tablet Take 1 tablet (50 mg total) by mouth 2 (two) times daily. 180 tablet 1   propranolol (INDERAL) 10 MG tablet TAKE 1 TABLET BY MOUTH TWICE DAILY FOR ESSENTIAL TREMOR 180 tablet 0   tamsulosin (FLOMAX) 0.4 MG CAPS capsule Take 1 capsule (0.4 mg total) by mouth daily. Follow-up appt is due must see provider for future refills 30 capsule 0   No current facility-administered medications on file prior to visit.     Review of Systems  Constitutional:  Negative for fever.  Respiratory:  Negative for cough, shortness of breath and wheezing.   Cardiovascular:  Negative for chest pain, palpitations and leg swelling.  Genitourinary:  Negative for difficulty urinating.  Neurological:  Positive for tremors. Negative for light-headedness and headaches.       Objective:   Vitals:   04/13/22 1353  BP: 124/70  Pulse: 78  Temp: 98 F (36.7 C)  SpO2: 98%   BP Readings from Last 3  Encounters:  04/13/22 124/70  03/21/22 138/60  12/08/21 118/79   Wt Readings from Last 3 Encounters:  04/13/22 144 lb (65.3 kg)  03/21/22 145 lb 9.6 oz (66 kg)  12/08/21 146 lb 12.8 oz (66.6 kg)   Body mass index is 21.9 kg/m.    Physical Exam Constitutional:      General: He is not in acute distress.    Appearance: Normal appearance. He is not ill-appearing.  HENT:     Head: Normocephalic and atraumatic.  Eyes:     Conjunctiva/sclera: Conjunctivae normal.  Cardiovascular:     Rate and Rhythm: Normal rate and regular rhythm.     Heart sounds: Normal heart sounds. No murmur heard. Pulmonary:     Effort: Pulmonary effort is normal. No respiratory distress.     Breath sounds: Normal breath sounds. No wheezing or rales.  Musculoskeletal:     Right lower leg: Edema (trace) present.     Left lower leg: No edema (trace).  Skin:    General: Skin is warm and dry.     Findings: No rash.  Neurological:     Mental Status: He is alert. Mental status is at baseline.  Psychiatric:        Mood and Affect: Mood normal.  Lab Results  Component Value Date   WBC 6.6 09/13/2021   HGB 13.4 09/13/2021   HCT 40.2 09/13/2021   PLT 197.0 09/13/2021   GLUCOSE 85 09/13/2021   CHOL 208 (H) 09/13/2021   TRIG 120.0 09/13/2021   HDL 67.20 09/13/2021   LDLDIRECT 115.6 03/05/2013   LDLCALC 117 (H) 09/13/2021   ALT 14 09/13/2021   AST 17 09/13/2021   NA 136 09/13/2021   K 4.5 09/13/2021   CL 102 09/13/2021   CREATININE 1.31 09/13/2021   BUN 31 (H) 09/13/2021   CO2 26 09/13/2021   TSH 4.49 09/13/2021   PSA 1.35 08/01/2016   INR 1.00 12/09/2014   HGBA1C 5.9 09/13/2021     Assessment & Plan:    See Problem List for Assessment and Plan of chronic medical problems.

## 2022-04-12 NOTE — Patient Instructions (Addendum)
     Blood work was ordered.     Medications changes include :      Your prescription(s) have been sent to your pharmacy.    A referral was ordered for XX.     Someone from that office will call you to schedule an appointment.    Return in about 6 months (around 10/13/2022) for follow up.

## 2022-04-13 ENCOUNTER — Ambulatory Visit (INDEPENDENT_AMBULATORY_CARE_PROVIDER_SITE_OTHER): Payer: Medicare Other | Admitting: Internal Medicine

## 2022-04-13 VITALS — BP 124/70 | HR 78 | Temp 98.0°F | Ht 68.0 in | Wt 144.0 lb

## 2022-04-13 DIAGNOSIS — G250 Essential tremor: Secondary | ICD-10-CM | POA: Diagnosis not present

## 2022-04-13 DIAGNOSIS — R7303 Prediabetes: Secondary | ICD-10-CM

## 2022-04-13 DIAGNOSIS — E7849 Other hyperlipidemia: Secondary | ICD-10-CM

## 2022-04-13 DIAGNOSIS — I10 Essential (primary) hypertension: Secondary | ICD-10-CM

## 2022-04-13 DIAGNOSIS — N400 Enlarged prostate without lower urinary tract symptoms: Secondary | ICD-10-CM

## 2022-04-13 DIAGNOSIS — N1831 Chronic kidney disease, stage 3a: Secondary | ICD-10-CM | POA: Diagnosis not present

## 2022-04-13 NOTE — Assessment & Plan Note (Addendum)
Chronic Following with Dr. Carles Collet Continue propranolol 10 mg twice daily, primidone 25 mg at bedtime - did not tolerate a higher dose Tremor fairly controlled

## 2022-04-13 NOTE — Assessment & Plan Note (Signed)
Chronic Following with nephrology CMP 

## 2022-04-13 NOTE — Assessment & Plan Note (Signed)
Chronic Lifestyle controlled Given his age could not start medication at this time

## 2022-04-13 NOTE — Assessment & Plan Note (Addendum)
Chronic Fairly controlled Nocturia 2-4 times a night Continue Proscar 5 mg daily, tamsulosin 0.4 mg daily

## 2022-04-13 NOTE — Assessment & Plan Note (Signed)
Chronic Check a1c Low sugar / carb diet Stressed regular exercise  

## 2022-04-13 NOTE — Assessment & Plan Note (Signed)
Chronic Blood pressure well controlled CMP Continue amlodipine 10 mg daily 

## 2022-04-19 DIAGNOSIS — D485 Neoplasm of uncertain behavior of skin: Secondary | ICD-10-CM | POA: Diagnosis not present

## 2022-04-19 DIAGNOSIS — Z85828 Personal history of other malignant neoplasm of skin: Secondary | ICD-10-CM | POA: Diagnosis not present

## 2022-04-19 DIAGNOSIS — D0439 Carcinoma in situ of skin of other parts of face: Secondary | ICD-10-CM | POA: Diagnosis not present

## 2022-04-19 DIAGNOSIS — L821 Other seborrheic keratosis: Secondary | ICD-10-CM | POA: Diagnosis not present

## 2022-05-04 ENCOUNTER — Telehealth: Payer: Self-pay | Admitting: Internal Medicine

## 2022-05-04 NOTE — Telephone Encounter (Signed)
Patient will call back to schedule AWV with [facility]  

## 2022-05-16 ENCOUNTER — Ambulatory Visit (INDEPENDENT_AMBULATORY_CARE_PROVIDER_SITE_OTHER): Payer: Medicare Other

## 2022-05-16 DIAGNOSIS — Z0000 Encounter for general adult medical examination without abnormal findings: Secondary | ICD-10-CM

## 2022-05-16 NOTE — Patient Instructions (Signed)
[name] , Thank you for taking time to come for your Medicare Wellness Visit. I appreciate your ongoing commitment to your health goals. Please review the following plan we discussed and let me know if I can assist you in the future.   These are the goals we discussed:  Goals      MY GOAL IS TO CONTINUE BEING PHYSICALLY ACTIVE BY WALKING.     Track and Manage My Blood Pressure-Hypertension     Timeframe:  Long-Range Goal Priority:  High Start Date:    04/26/2021                         Expected End Date:   10/07/2022                  Follow Up Date 06/06/2022   - check blood pressure weekly - choose a place to take my blood pressure (home, clinic or office, retail store) - write blood pressure results in a log or diary    Why is this important?   You won't feel high blood pressure, but it can still hurt your blood vessels.  High blood pressure can cause heart or kidney problems. It can also cause a stroke.  Making lifestyle changes like losing a little weight or eating less salt will help.  Checking your blood pressure at home and at different times of the day can help to control blood pressure.  If the doctor prescribes medicine remember to take it the way the doctor ordered.  Call the office if you cannot afford the medicine or if there are questions about it.          This is a list of the screening recommended for you and due dates:  Health Maintenance  Topic Date Due   Zoster (Shingles) Vaccine (1 of 2) Never done   COVID-19 Vaccine (5 - Pfizer series) 11/10/2020   Flu Shot  03/15/2022   Tetanus Vaccine  01/15/2031   Pneumonia Vaccine  Completed   HPV Vaccine  Aged Out    Advanced directives: YES; Please bring a copy of your health care power of attorney and living will to the office at your convenience.  Conditions/risks identified: YES  Next appointment: Follow up in one year for your annual wellness visit.   Preventive Care 69 Years and 86 Years and Older,  Male  Preventive care refers to lifestyle choices and visits with your health care provider that can promote health and wellness. What does preventive care include? A yearly physical exam. This is also called an annual well check. Dental exams once or twice a year. Routine eye exams. Ask your health care provider how often you should have your eyes checked. Personal lifestyle choices, including: Daily care of your teeth and gums. Regular physical activity. Eating a healthy diet. Avoiding tobacco and drug use. Limiting alcohol use. Practicing safe sex. Taking low doses of aspirin every day. Taking vitamin and mineral supplements as recommended by your health care provider. What happens during an annual well check? The services and screenings done by your health care provider during your annual well check will depend on your age, overall health, lifestyle risk factors, and family history of disease. Counseling  Your health care provider may ask you questions about your: Alcohol use. Tobacco use. Drug use. Emotional well-being. Home and relationship well-being. Sexual activity. Eating habits. History of falls. Memory and ability to understand (cognition). Work and work Statistician. Screening  You may  have the following tests or measurements: Height, weight, and BMI. Blood pressure. Lipid and cholesterol levels. These may be checked every 5 years, or more frequently if you are over 86 years old. Skin check. Lung cancer screening. You may have this screening every year starting at age 86 if you have a 30-pack-year history of smoking and currently smoke or have quit within the past 15 years. Fecal occult blood test (FOBT) of the stool. You may have this test every year starting at age 40. Flexible sigmoidoscopy or colonoscopy. You may have a sigmoidoscopy every 5 years or a colonoscopy every 10 years starting at age 86. Prostate cancer screening. Recommendations will vary depending  on your family history and other risks. Hepatitis C blood test. Hepatitis B blood test. Sexually transmitted disease (STD) testing. Diabetes screening. This is done by checking your blood sugar (glucose) after you have not eaten for a while (fasting). You may have this done every 1-3 years. Abdominal aortic aneurysm (AAA) screening. You may need this if you are a current or former smoker. Osteoporosis. You may be screened starting at age 86 if you are at high risk. Talk with your health care provider about your test results, treatment options, and if necessary, the need for more tests. Vaccines  Your health care provider may recommend certain vaccines, such as: Influenza vaccine. This is recommended every year. Tetanus, diphtheria, and acellular pertussis (Tdap, Td) vaccine. You may need a Td booster every 10 years. Zoster vaccine. You may need this after age 86. Pneumococcal 13-valent conjugate (PCV13) vaccine. One dose is recommended after age 86. Pneumococcal polysaccharide (PPSV23) vaccine. One dose is recommended after age 86. Talk to your health care provider about which screenings and vaccines you need and how often you need them. This information is not intended to replace advice given to you by your health care provider. Make sure you discuss any questions you have with your health care provider. Document Released: 08/28/2015 Document Revised: 04/20/2016 Document Reviewed: 06/02/2015 Elsevier Interactive Patient Education  2017 [place] Prevention in the Home Falls can cause injuries. They can happen to people of all ages. There are many things you can do to make your home safe and to help prevent falls. What can I do on the outside of my home? Regularly fix the edges of walkways and driveways and fix any cracks. Remove anything that might make you trip as you walk through a door, such as a raised step or threshold. Trim any bushes or trees on the path to your home. Use  bright outdoor lighting. Clear any walking paths of anything that might make someone trip, such as rocks or tools. Regularly check to see if handrails are loose or broken. Make sure that both sides of any steps have handrails. Any raised decks and porches should have guardrails on the edges. Have any leaves, snow, or ice cleared regularly. Use sand or salt on walking paths during winter. Clean up any spills in your garage right away. This includes oil or grease spills. What can I do in the bathroom? Use night lights. Install grab bars by the toilet and in the tub and shower. Do not use towel bars as grab bars. Use non-skid mats or decals in the tub or shower. If you need to sit down in the shower, use a plastic, non-slip stool. Keep the floor dry. Clean up any water that spills on the floor as soon as it happens. Remove soap buildup in the tub  or shower regularly. Attach bath mats securely with double-sided non-slip rug tape. Do not have throw rugs and other things on the floor that can make you trip. What can I do in the bedroom? Use night lights. Make sure that you have a light by your bed that is easy to reach. Do not use any sheets or blankets that are too big for your bed. They should not hang down onto the floor. Have a firm chair that has side arms. You can use this for support while you get dressed. Do not have throw rugs and other things on the floor that can make you trip. What can I do in the kitchen? Clean up any spills right away. Avoid walking on wet floors. Keep items that you use a lot in easy-to-reach places. If you need to reach something above you, use a strong step stool that has a grab bar. Keep electrical cords out of the way. Do not use floor polish or wax that makes floors slippery. If you must use wax, use non-skid floor wax. Do not have throw rugs and other things on the floor that can make you trip. What can I do with my stairs? Do not leave any items on the  stairs. Make sure that there are handrails on both sides of the stairs and use them. Fix handrails that are broken or loose. Make sure that handrails are as long as the stairways. Check any carpeting to make sure that it is firmly attached to the stairs. Fix any carpet that is loose or worn. Avoid having throw rugs at the top or bottom of the stairs. If you do have throw rugs, attach them to the floor with carpet tape. Make sure that you have a light switch at the top of the stairs and the bottom of the stairs. If you do not have them, ask someone to add them for you. What else can I do to help prevent falls? Wear shoes that: Do not have high heels. Have rubber bottoms. Are comfortable and fit you well. Are closed at the toe. Do not wear sandals. If you use a stepladder: Make sure that it is fully opened. Do not climb a closed stepladder. Make sure that both sides of the stepladder are locked into place. Ask someone to hold it for you, if possible. Clearly mark and make sure that you can see: Any grab bars or handrails. First and last steps. Where the edge of each step is. Use tools that help you move around (mobility aids) if they are needed. These include: Canes. Walkers. Scooters. Crutches. Turn on the lights when you go into a dark area. Replace any light bulbs as soon as they burn out. Set up your furniture so you have a clear path. Avoid moving your furniture around. If any of your floors are uneven, fix them. If there are any pets around you, be aware of where they are. Review your medicines with your doctor. Some medicines can make you feel dizzy. This can increase your chance of falling. Ask your doctor what other things that you can do to help prevent falls. This information is not intended to replace advice given to you by your health care provider. Make sure you discuss any questions you have with your health care provider. Document Released: 05/28/2009 Document Revised:  01/07/2016 Document Reviewed: 09/05/2014 Elsevier Interactive Patient Education  2017 [employer].

## 2022-05-16 NOTE — Progress Notes (Signed)
Virtual Visit via Telephone Note  I connected with  Roger Ingram on 05/16/22 at  3:30 PM EDT by telephone and verified that I am speaking with the correct person using two identifiers.  Location: Patient: HOME Provider: [facility] Persons participating in the virtual visit: patient/Nurse Health Advisor   I discussed the limitations, risks, security and privacy concerns of performing an evaluation and management service by telephone and the availability of in person appointments. The patient expressed understanding and agreed to proceed.  Interactive audio and video telecommunications were attempted between this nurse and patient, however failed, due to patient having technical difficulties OR patient did not have access to video capability.  We continued and completed visit with audio only.  Some vital signs may be absent or patient reported.   Roger Flow, LPN  Subjective:   Roger Ingram is a 88 y.o. male who presents for Medicare Annual/Subsequent preventive examination.  Review of Systems     Cardiac Risk Factors include: advanced age (>56mn, >>2women);hypertension;male gender;family history of premature cardiovascular disease     Objective:    There were no vitals filed for this visit. There is no height or weight on file to calculate BMI.     05/16/2022    3:35 PM 03/21/2022    1:54 PM 12/08/2021    9:43 AM 12/09/2019    8:09 AM 04/05/2019    8:19 AM 12/09/2014    9:17 AM  Advanced Directives  Does Patient Have a Medical Advance Directive? Yes Yes Yes Yes Yes Yes  Type of AParamed[place]of [place] will [place] will Living will H[place]Living will Living will;Healthcare Power of [place]St. [place] will  Does patient want to make changes to medical advance directive?      No - Patient declined  Copy of [place] Chart? No - copy  requested     Yes    Current Medications (verified) Outpatient Encounter Medications as of 05/16/2022  Medication Sig   amLODipine (NORVASC) 10 MG tablet Take 1 tablet (10 mg total) by mouth daily.   aspirin EC 81 MG tablet Take 81 mg by mouth daily.   cholecalciferol (VITAMIN D3) 25 MCG (1000 UNIT) tablet Take 1,000 Units by mouth daily.   finasteride (PROSCAR) 5 MG tablet Take 1 tablet by mouth once daily   folic acid (FOLVITE) 4619MCG tablet Take 400 mcg by mouth daily.   primidone (MYSOLINE) 50 MG tablet Take 1 tablet (50 mg total) by mouth 2 (two) times daily.   propranolol (INDERAL) 10 MG tablet TAKE 1 TABLET BY MOUTH TWICE DAILY FOR ESSENTIAL TREMOR   tamsulosin (FLOMAX) 0.4 MG CAPS capsule Take 1 capsule (0.4 mg total) by mouth daily. Follow-up appt is due must see provider for future refills   meclizine (ANTIVERT) 12.5 MG tablet Take 1-2 tablets (12.5-25 mg total) by mouth 3 (three) times daily as needed for dizziness. (Patient not taking: Reported on 05/16/2022)   No facility-administered encounter medications on file as of 05/16/2022.    Allergies (verified) Hydrocodone and Iohexol   History: Past Medical History:  Diagnosis Date   Basal cell cancer    BPH (benign prostatic hypertrophy)    Dr EJunious Silk  Chronic kidney disease    Hx of clear fluid sacs on both kidneys. Drained about 2006   Diverticulosis of colon    Elevated PSA    [facility]   History  of kidney stones    Hx of colonic polyps    Hyperlipidemia    Hypertension    Melanoma ([place])    Past Surgical History:  Procedure Laterality Date   basal cell     R  & L temple; Dr Allyson Sabal   COLONOSCOPY W/ POLYPECTOMY  1997, 2011   Dr Earlean Shawl   melanoma     L forehead   Right rotator cuff sx     TOTAL HIP ARTHROPLASTY Left 12/19/2014   Procedure: LEFT TOTAL HIP ARTHROPLASTY ANTERIOR APPROACH;  Surgeon: Mcarthur Rossetti, MD;  Location: [facility];  Service: Orthopedics;  Laterality: Left;   Family History   Problem Relation Age of Onset   Colon cancer Brother 51   Other Sister        addisons disease   Dementia Sister    Stroke Brother    Stroke Mother 56   Heart attack Father 15   Stroke Father    Stroke Paternal Grandfather        in 29s   Diabetes Son        IDDM   Crohn's disease Daughter    Drug abuse Son    Social History   Socioeconomic History   Marital status: Married    Spouse name: Not on file   Number of children: 3   Years of education: Not on file   Highest education level: Professional school degree (e.g., MD, DDS, DVM, JD)  Occupational History   Occupation: retired    Comment: Insurance underwriter  Tobacco Use   Smoking status: Never   Smokeless tobacco: Never  Vaping Use   Vaping Use: Never used  Substance and Sexual Activity   Alcohol use: No    Alcohol/week: 0.0 standard drinks of alcohol   Drug use: No   Sexual activity: Not on file  Other Topics Concern   Not on file  Social History Narrative   Walks twice daily for exercise   Pt lives at home with spouse 1 story home- he has 3 children 2 which are living   Pt can use both hands to write   Social Determinants of Health   Financial Resource Strain: Low Risk  (05/16/2022)   Overall Financial Resource Strain (CARDIA)    Difficulty of Paying Living Expenses: Not hard at all  Food Insecurity: No Food Insecurity (05/16/2022)   Hunger Vital Sign    Worried About Running Out of Food in the Last Year: Never true    [place] in the Last Year: Never true  Transportation Needs: No Transportation Needs (05/16/2022)   PRAPARE - Hydrologist (Medical): No    Lack of Transportation (Non-Medical): No  Physical Activity: Sufficiently Active (05/16/2022)   Exercise Vital Sign    Days of Exercise per Week: 7 days    Minutes of Exercise per Session: 30 min  Stress: No Stress Concern Present (05/16/2022)   Roger Ingram     Feeling of Stress : Not at all  Social Connections: [place] (05/16/2022)   Social Connection and Isolation Panel [NHANES]    Frequency of Communication with Friends and Family: More than three times a week    Frequency of Social Gatherings with Friends and Family: More than three times a week    Attends Religious Services: More than 4 times per year    Active Member of [employer] or Organizations: Yes    Attends [employer]  or Organization Meetings: More than 4 times per year    Marital Status: Married    Tobacco Counseling Counseling given: Not Answered   Clinical Intake:  Pre-visit preparation completed: Yes  Pain : No/denies pain     Nutritional Risks: None Diabetes: No  How often do you need to have someone help you when you read instructions, pamphlets, or other written materials from your doctor or pharmacy?: 1 - Never What is the last grade level you completed in school?: HSG; SOME COLLEGE; MILITARY (COMMERCIAL PILOT)  Diabetic? NO  Interpreter Needed?: No  Information entered by :: Lisette Abu, LPN.   Activities of Daily Living    05/16/2022    3:45 PM  In your present state of health, do you have any difficulty performing the following activities:  Hearing? 0  Vision? 0  Difficulty concentrating or making decisions? 0  Walking or climbing stairs? 0  Dressing or bathing? 0  Doing errands, shopping? 0  Preparing Food and eating ? N  Using the Toilet? N  In the past six months, have you accidently leaked urine? N  Do you have problems with loss of bowel control? N  Managing your Medications? N  Managing your Finances? N  Housekeeping or managing your Housekeeping? N    Patient Care Team: Binnie Rail, MD as PCP - General (Internal Medicine) Szabat, Darnelle Maffucci, [facility] (Inactive) as Pharmacist (Pharmacist) Tat, Eustace Quail, DO as Consulting Physician (Neurology) Luberta Mutter, MD as Consulting Physician (Ophthalmology)  Indicate any recent Medical  Services you may have received from other than [facility] providers in the past year (date may be approximate).     Assessment:   This is a routine wellness examination for Roger Ingram.  Hearing/Vision screen Hearing Screening - Comments:: PATIENT WEARS HEARING AIDS. Vision Screening - Comments:: PATIENT WEARS READERS; CATARACTS REMOVED AND EYE EXAM DONE BY: CHRISTINE McCUEN, MD.  Dietary issues and exercise activities discussed: Current Exercise Habits: Home exercise routine, Type of exercise: walking, Time (Minutes): 30, Frequency (Times/Week): 7, Weekly Exercise (Minutes/Week): 210, Intensity: Moderate, Exercise limited by: orthopedic condition(s)   Goals Addressed             This Visit's Progress    MY GOAL IS TO CONTINUE BEING PHYSICALLY ACTIVE BY WALKING.        Depression Screen    05/16/2022    3:43 PM 04/13/2022    1:58 PM 09/13/2021    1:07 PM 09/10/2020    8:56 AM 09/09/2019    2:02 PM 02/11/2019    2:20 PM 08/01/2017    9:08 AM  PHQ 2/9 Scores  PHQ - 2 Score 0 0 0 0 0 0 0    Fall Risk    05/16/2022    3:36 PM 04/13/2022    1:58 PM 03/21/2022    1:54 PM 12/08/2021    9:42 AM 01/14/2021   11:09 AM  Fall Risk   Falls in the past year? '1 1 1 1 '$ 0  Number falls in past yr: 0 0 0 0 0  Injury with Fall? 1 1 0 0 0  Risk for fall due to :  No Fall Risks   No Fall Risks  Follow up Falls evaluation completed Falls evaluation completed   Falls evaluation completed    [place]:  Any stairs in or around the home? No  If so, are there any without handrails? No  Home free of loose throw rugs in walkways, pet  beds, electrical cords, etc? Yes  Adequate lighting in your home to reduce risk of falls? Yes   ASSISTIVE DEVICES UTILIZED TO PREVENT FALLS:  Life alert? No  Use of a cane, walker or w/c? No  Grab bars in the bathroom? Yes  Shower chair or bench in shower? No  Elevated toilet seat or a handicapped toilet? No   TIMED UP AND GO:  Was the  test performed? No . PHONE VISIT  Cognitive Function:        05/16/2022    3:47 PM  6CIT Screen  What Year? 0 points  What month? 0 points  What time? 0 points  Count back from 20 0 points  Months in reverse 0 points  Repeat phrase 0 points  Total Score 0 points    Immunizations Immunization History  Administered Date(s) Administered   Fluad Quad(high Dose 65+) 09/10/2020, 06/18/2021   Influenza Whole 06/02/2008   Influenza, High Dose Seasonal PF 08/01/2017   PFIZER(Purple Top)SARS-COV-2 Vaccination 09/20/2019, 10/11/2019, 03/15/2020, 09/15/2020   Pneumococcal Conjugate-13 07/29/2015   Pneumococcal Polysaccharide-23 07/29/2016   Tdap 01/14/2021    TDAP status: Up to date  Flu Vaccine status: Due, Education has been provided regarding the importance of this vaccine. Advised may receive this vaccine at local pharmacy or Health Dept. Aware to provide a copy of the vaccination record if obtained from local pharmacy or Health Dept. Verbalized acceptance and understanding.  Pneumococcal vaccine status: Up to date  Covid-19 vaccine status: Completed vaccines  Qualifies for Shingles Vaccine? Yes   Zostavax completed No   Shingrix Completed?: No.    Education has been provided regarding the importance of this vaccine. Patient has been advised to call insurance company to determine out of pocket expense if they have not yet received this vaccine. Advised may also receive vaccine at local pharmacy or Health Dept. Verbalized acceptance and understanding.  Screening Tests Health Maintenance  Topic Date Due   Zoster Vaccines- Shingrix (1 of 2) Never done   COVID-19 Vaccine (5 - Pfizer series) 11/10/2020   INFLUENZA VACCINE  03/15/2022   TETANUS/TDAP  01/15/2031   Pneumonia Vaccine 91+ Years old  Completed   HPV VACCINES  Aged Out    Health Maintenance  Health Maintenance Due  Topic Date Due   Zoster Vaccines- Shingrix (1 of 2) Never done   COVID-19 Vaccine (5 - Pfizer  series) 11/10/2020   INFLUENZA VACCINE  03/15/2022    Colorectal cancer screening: No longer required.   Lung Cancer Screening: (Low Dose CT Chest recommended if Age 9-80 years, 30 pack-year currently smoking OR have quit w/in 15years.) does not qualify.   Lung Cancer Screening Referral: NO  Additional Screening:  Hepatitis C Screening: does not qualify; Completed NO  Vision Screening: Recommended annual ophthalmology exams for early detection of glaucoma and other disorders of the eye. Is the patient up to date with their annual eye exam?  Yes  Who is the provider or what is the name of the office in which the patient attends annual eye exams? Luberta Mutter, MD. If pt is not established with a provider, would they like to be referred to a provider to establish care? No .   Dental Screening: Recommended annual dental exams for proper oral hygiene  Community Resource Referral / Chronic Care Management: CRR required this visit?  No   CCM required this visit?  No      Plan:     I have personally reviewed and noted the following  in the patient's chart:   Medical and social history Use of alcohol, tobacco or illicit drugs  Current medications and supplements including opioid prescriptions. Patient is not currently taking opioid prescriptions. Functional ability and status Nutritional status Physical activity Advanced directives List of other physicians Hospitalizations, surgeries, and ER visits in previous 12 months Vitals Screenings to include cognitive, depression, and falls Referrals and appointments  In addition, I have reviewed and discussed with patient certain preventive protocols, quality metrics, and best practice recommendations. A written personalized care plan for preventive services as well as general preventive health recommendations were provided to patient.     Roger Flow, LPN   [id number]/11/[id number]   Nurse Notes: N/A

## 2022-06-08 ENCOUNTER — Telehealth: Payer: Medicare Other

## 2022-06-13 ENCOUNTER — Other Ambulatory Visit (HOSPITAL_BASED_OUTPATIENT_CLINIC_OR_DEPARTMENT_OTHER): Payer: Self-pay

## 2022-06-13 DIAGNOSIS — Z23 Encounter for immunization: Secondary | ICD-10-CM | POA: Diagnosis not present

## 2022-06-13 MED ORDER — COMIRNATY 30 MCG/0.3ML IM SUSY
PREFILLED_SYRINGE | INTRAMUSCULAR | 0 refills | Status: DC
Start: 2022-06-13 — End: 2022-07-13
  Filled 2022-06-13: qty 0.3, 1d supply, fill #0

## 2022-06-13 MED ORDER — INFLUENZA VAC A&B SA ADJ QUAD 0.5 ML IM PRSY
PREFILLED_SYRINGE | INTRAMUSCULAR | 0 refills | Status: DC
  Filled 2022-06-13: qty 0.5, 1d supply, fill #0

## 2022-07-13 ENCOUNTER — Encounter: Payer: Self-pay | Admitting: Internal Medicine

## 2022-07-13 NOTE — Patient Instructions (Addendum)
      Blood work was ordered.   The lab is on the first floor.    Medications changes include : None     Return in about 6 months (around 01/12/2023) for follow up.

## 2022-07-13 NOTE — Progress Notes (Unsigned)
      Subjective:    Patient ID: Roger Ingram, male    DOB: 05-04-1929, 88 y.o.   MRN: [id number]     HPI Roger Ingram is here for follow up of his chronic medical problems, including htn, hld, CKD, prediabetes, tremor, BPH  He walks regularly.    Medications and allergies reviewed with patient and updated if appropriate.  Current Outpatient Medications on File Prior to Visit  Medication Sig Dispense Refill   amLODipine (NORVASC) 10 MG tablet Take 1 tablet (10 mg total) by mouth daily. 90 tablet    aspirin EC 81 MG tablet Take 81 mg by mouth daily.     cholecalciferol (VITAMIN D3) 25 MCG (1000 UNIT) tablet Take 1,000 Units by mouth daily.     COVID-19 mRNA vaccine 2023-2024 (COMIRNATY) syringe Inject into the muscle. 0.3 mL 0   finasteride (PROSCAR) 5 MG tablet Take 1 tablet by mouth once daily 90 tablet 2   folic acid (FOLVITE) 885 MCG tablet Take 400 mcg by mouth daily.     influenza vaccine adjuvanted (FLUAD) 0.5 ML injection Inject into the muscle. 0.5 mL 0   meclizine (ANTIVERT) 12.5 MG tablet Take 1-2 tablets (12.5-25 mg total) by mouth 3 (three) times daily as needed for dizziness. (Patient not taking: Reported on 05/16/2022) 40 tablet 1   primidone (MYSOLINE) 50 MG tablet Take 1 tablet (50 mg total) by mouth 2 (two) times daily. 180 tablet 1   propranolol (INDERAL) 10 MG tablet TAKE 1 TABLET BY MOUTH TWICE DAILY FOR ESSENTIAL TREMOR 180 tablet 0   tamsulosin (FLOMAX) 0.4 MG CAPS capsule Take 1 capsule (0.4 mg total) by mouth daily. Follow-up appt is due must see provider for future refills 30 capsule 0   No current facility-administered medications on file prior to visit.     Review of Systems     Objective:  There were no vitals filed for this visit. BP Readings from Last 3 Encounters:  04/13/22 124/70  03/21/22 138/60  12/08/21 118/79   Wt Readings from Last 3 Encounters:  04/13/22 144 lb (65.3 kg)  03/21/22 145 lb 9.6 oz (66 kg)  12/08/21 146 lb 12.8 oz (66.6 kg)    There is no height or weight on file to calculate BMI.    Physical Exam     Lab Results  Component Value Date   WBC 6.6 09/13/2021   HGB 13.4 09/13/2021   HCT 40.2 09/13/2021   PLT 197.0 09/13/2021   GLUCOSE 85 09/13/2021   CHOL 208 (H) 09/13/2021   TRIG 120.0 09/13/2021   HDL 67.20 09/13/2021   LDLDIRECT 115.6 03/05/2013   LDLCALC 117 (H) 09/13/2021   ALT 14 09/13/2021   AST 17 09/13/2021   NA 136 09/13/2021   K 4.5 09/13/2021   CL 102 09/13/2021   CREATININE 1.31 09/13/2021   BUN 31 (H) 09/13/2021   CO2 26 09/13/2021   TSH 4.49 09/13/2021   PSA 1.35 08/01/2016   INR 1.00 12/09/2014   HGBA1C 5.9 09/13/2021     Assessment & Plan:    See Problem List for Assessment and Plan of chronic medical problems.

## 2022-07-14 ENCOUNTER — Other Ambulatory Visit: Payer: Self-pay

## 2022-07-14 ENCOUNTER — Ambulatory Visit (INDEPENDENT_AMBULATORY_CARE_PROVIDER_SITE_OTHER): Payer: Medicare Other | Admitting: Internal Medicine

## 2022-07-14 VITALS — BP 122/72 | HR 68 | Temp 97.9°F | Ht 68.0 in | Wt 147.0 lb

## 2022-07-14 DIAGNOSIS — I10 Essential (primary) hypertension: Secondary | ICD-10-CM

## 2022-07-14 DIAGNOSIS — R7303 Prediabetes: Secondary | ICD-10-CM | POA: Diagnosis not present

## 2022-07-14 DIAGNOSIS — E7849 Other hyperlipidemia: Secondary | ICD-10-CM

## 2022-07-14 DIAGNOSIS — N1831 Chronic kidney disease, stage 3a: Secondary | ICD-10-CM

## 2022-07-14 DIAGNOSIS — G250 Essential tremor: Secondary | ICD-10-CM | POA: Diagnosis not present

## 2022-07-14 DIAGNOSIS — N400 Enlarged prostate without lower urinary tract symptoms: Secondary | ICD-10-CM

## 2022-07-14 MED ORDER — TAMSULOSIN HCL 0.4 MG PO CAPS: 0.4000 mg | ORAL_CAPSULE | Freq: Every day | ORAL | 2 refills | Status: DC

## 2022-07-14 NOTE — Assessment & Plan Note (Signed)
Chronic Blood pressure well controlled CMP Continue amlodipine 10 mg daily, propranolol 10 mg twice daily which is mostly for his tremor

## 2022-07-14 NOTE — Assessment & Plan Note (Addendum)
Chronic Following with nephrology CMP 

## 2022-07-14 NOTE — Assessment & Plan Note (Addendum)
Chronic Following with Dr. Carles Collet On propranolol 10 mg twice daily, primidone 25 mg at bedtime ( does not tolerate a higher dose) Overall fairly controlled

## 2022-07-14 NOTE — Assessment & Plan Note (Signed)
Chronic Poorly controlled Continue Proscar 5 mg daily, Flomax 0.4 mg daily

## 2022-07-14 NOTE — Assessment & Plan Note (Signed)
Chronic Check a1c Low sugar / carb diet Stressed regular exercise  

## 2022-07-14 NOTE — Assessment & Plan Note (Signed)
Chronic Regular exercise and healthy diet encouraged Check lipid panel  Continue lifestyle control 

## 2022-09-01 DIAGNOSIS — I129 Hypertensive chronic kidney disease with stage 1 through stage 4 chronic kidney disease, or unspecified chronic kidney disease: Secondary | ICD-10-CM | POA: Diagnosis not present

## 2022-09-01 DIAGNOSIS — N400 Enlarged prostate without lower urinary tract symptoms: Secondary | ICD-10-CM | POA: Diagnosis not present

## 2022-09-01 DIAGNOSIS — N281 Cyst of kidney, acquired: Secondary | ICD-10-CM | POA: Diagnosis not present

## 2022-09-01 DIAGNOSIS — N1831 Chronic kidney disease, stage 3a: Secondary | ICD-10-CM | POA: Diagnosis not present

## 2022-09-01 DIAGNOSIS — G250 Essential tremor: Secondary | ICD-10-CM | POA: Diagnosis not present

## 2022-09-12 ENCOUNTER — Other Ambulatory Visit: Payer: Self-pay | Admitting: Internal Medicine

## 2022-09-14 ENCOUNTER — Encounter: Payer: Medicare Other | Admitting: Internal Medicine

## 2022-09-27 ENCOUNTER — Other Ambulatory Visit: Payer: Self-pay | Admitting: Internal Medicine

## 2022-10-18 DIAGNOSIS — T1511XA Foreign body in conjunctival sac, right eye, initial encounter: Secondary | ICD-10-CM | POA: Diagnosis not present

## 2022-11-02 ENCOUNTER — Ambulatory Visit (INDEPENDENT_AMBULATORY_CARE_PROVIDER_SITE_OTHER): Payer: Medicare Other | Admitting: Internal Medicine

## 2022-11-02 ENCOUNTER — Encounter: Payer: Self-pay | Admitting: Internal Medicine

## 2022-11-02 VITALS — BP 132/82 | HR 54 | Temp 98.0°F | Ht 68.0 in | Wt 143.0 lb

## 2022-11-02 DIAGNOSIS — G4486 Cervicogenic headache: Secondary | ICD-10-CM | POA: Insufficient documentation

## 2022-11-02 MED ORDER — PREDNISONE 20 MG PO TABS: 20.0000 mg | ORAL_TABLET | Freq: Every day | ORAL | 0 refills | Status: AC

## 2022-11-02 NOTE — Patient Instructions (Addendum)
Medications changes include :   prednisone 20 mg daily x 5 days.  You can take tylenol as needed - you can take up to 3000 mg of tylenol a day.      Return if symptoms worsen or fail to improve.    Cervicogenic Headache  In a cervicogenic headache, the pain moves from your neck to your head. Most cervicogenic headaches start in the upper part of the neck with the first three cervical bones (cervical vertebrae). What are the causes? The most common cause of this condition is a traumatic injury to the bones and tissues in your neck (cervical spine). Whiplash is an example of a cervical spine injury. Other causes include: Arthritis. Broken bone (fracture). Infection. Tumor. What are the signs or symptoms? The most common symptoms are neck and head pain. The pain is often located on one side. In some cases, there may be head pain without neck pain. Pain may be felt in the neck, back or side of the head, face, or behind the eyes. Other symptoms include: Limited movement in the neck. Arm or shoulder pain. How is this diagnosed? This condition may be diagnosed based on: Your symptoms. A physical exam. An injection that blocks nerve signals (diagnostic nerve block). Imaging tests, such as: X-rays. CT scan. MRI. A cervicogenic headache is diagnosed when a cause can be found in the cervical spine and other causes of headaches can be ruled out. How is this treated? Treatment for this condition may depend on the underlying condition. Treatment may include: Medicines, such as: NSAIDs, such as ibuprofen. Muscle relaxants. Physical therapy. Massage therapy. Complementary therapies, such as: Biofeedback. Meditation. Acupuncture. Nerve block injections to reduce the pain. Botulinum toxin injections. Your treatment plan may involve working with a pain management team that includes your primary health care provider, a pain management specialist, a neurologist, and a physical  therapist. Follow these instructions at home: Take over-the-counter and prescription medicines only as told by your health care provider. Do exercises at home as told by your physical therapist. Return to your normal activities as told by your health care provider. Ask your health care provider what activities are safe for you. Avoid activities that trigger your headaches. Maintain good neck support and posture at home and at work. Keep all follow-up visits. This is important. Contact a health care provider if: You have headaches that are getting worse and happening more often. You have headaches with any of the following: Fever. Numbness. Weakness. Dizziness. Nausea or vomiting. Get help right away if: You have a sudden and severe headache. This symptom may be an emergency. Get help right away. Call 911. Do not wait to see if this symptom will go away. Do not drive yourself to the hospital. Summary A cervicogenic headache is a headache caused by a condition that affects the bones and tissues in your cervical spine. Your health care provider may diagnose this condition with a physical exam, a diagnostic nerve block, and imaging tests. Treatment may include medicine to reduce pain and inflammation, physical therapy, and nerve block injections. Complementary therapies, such as acupuncture and meditation, may be added to other treatments. Your treatment plan may involve working with a pain management team that includes your primary health care provider, a pain management specialist, a neurologist, and a physical therapist. This information is not intended to replace advice given to you by your health care provider. Make sure you discuss any questions you have with your health care provider.  Document Revised: 02/04/2021 Document Reviewed: 02/04/2021 Elsevier Patient Education  [place].

## 2022-11-02 NOTE — Assessment & Plan Note (Signed)
Acute Symptoms new Symptoms and exam consistent with cervicogenic headache Reassured him this is not related to his previous skin cancer/melanoma Symptoms have improved a little bit which hopefully means they will completely resolve Can treat neck with heat, ice, stretching, topical medications Can take Tylenol-advised can take up to 3000 mg in 1 day Start prednisone 20 mg daily for 5 days Can consider gabapentin-but would like to avoid given possible side effects Can consider physical therapy, further imaging, but will hold off for now since we know he has degenerative disc disease in the neck and that is likely the cause Call if no improvement

## 2022-11-02 NOTE — Progress Notes (Signed)
Subjective:    Patient ID: Roger Ingram, male    DOB: February 07, 1929, 89 y.o.   MRN: [id number]      HPI Roger Ingram is here for  Chief Complaint  Patient presents with   Headache    Had for about 5 days took otc medication and headache keeps coming back      Headaches - soreness in head posterior left ear and down in to neck. Sometimes he feels it by the left eye - dull pain.  It started 4-5 days ago.  He denies anything he did causing it.  He did have some cancer on that side which is what worries him.  If he shakes his head it feels very sore in his head behind his left ear.  Any change in movement causes pain.  Not much tingling.  A little numbness.  He does have some neck pain, which is not new.  He has only taken 2 tylenols - one Sunday night and yesterday morning.  He did not want to mask the pain.    Medications and allergies reviewed with patient and updated if appropriate.  Current Outpatient Medications on File Prior to Visit  Medication Sig Dispense Refill   amLODipine (NORVASC) 10 MG tablet Take 1 tablet (10 mg total) by mouth daily. 90 tablet    aspirin EC 81 MG tablet Take 81 mg by mouth daily.     cholecalciferol (VITAMIN D3) 25 MCG (1000 UNIT) tablet Take 1,000 Units by mouth daily.     finasteride (PROSCAR) 5 MG tablet Take 1 tablet (5 mg total) by mouth daily. Follow-up appt due in May must see provider for future refills 90 tablet 0   folic acid (FOLVITE) A999333 MCG tablet Take 400 mcg by mouth daily.     meclizine (ANTIVERT) 12.5 MG tablet Take 1-2 tablets (12.5-25 mg total) by mouth 3 (three) times daily as needed for dizziness. 40 tablet 1   primidone (MYSOLINE) 50 MG tablet Take 1 tablet (50 mg total) by mouth 2 (two) times daily. 180 tablet 1   propranolol (INDERAL) 10 MG tablet TAKE 1 TABLET BY MOUTH TWICE DAILY FOR ESSENTIAL TREMOR 180 tablet 0   tamsulosin (FLOMAX) 0.4 MG CAPS capsule Take 1 capsule (0.4 mg total) by mouth daily. 90 capsule 2   No current  facility-administered medications on file prior to visit.    Review of Systems  Eyes:  Negative for visual disturbance.  Musculoskeletal:  Positive for neck pain.  Neurological:  Positive for headaches. Negative for dizziness and light-headedness.       Objective:   Vitals:   11/02/22 1514  BP: 132/82  Pulse: (!) 54  Temp: 98 F (36.7 C)  SpO2: 98%   BP Readings from Last 3 Encounters:  11/02/22 132/82  07/14/22 122/72  04/13/22 124/70   Wt Readings from Last 3 Encounters:  11/02/22 143 lb (64.9 kg)  07/14/22 147 lb (66.7 kg)  04/13/22 144 lb (65.3 kg)   Body mass index is 21.74 kg/m.    Physical Exam Constitutional:      General: He is not in acute distress.    Appearance: Normal appearance. He is well-developed. He is not ill-appearing.  HENT:     Head: Normocephalic and atraumatic.  Musculoskeletal:        General: Tenderness (Mild tenderness with palpation of posterior neck and trapezius muscle on the left side, slight tenderness behind left ear) present. No swelling or deformity.     Right  lower leg: No edema.     Left lower leg: No edema.  Skin:    General: Skin is warm and dry.     Findings: No erythema or rash.  Neurological:     Mental Status: He is alert.     Sensory: No sensory deficit.            Assessment & Plan:    See Problem List for Assessment and Plan of chronic medical problems.

## 2023-01-09 ENCOUNTER — Other Ambulatory Visit: Payer: Self-pay | Admitting: Internal Medicine

## 2023-01-10 ENCOUNTER — Encounter: Payer: Self-pay | Admitting: Internal Medicine

## 2023-01-10 IMAGING — US US EXTREM LOW*R* LIMITED
1 series · 11 of 11 positions shown · non-contrast
Comparison: None.

CLINICAL DATA: Posteromedial right knee lump

EXAM:
ULTRASOUND RIGHT LOWER EXTREMITY LIMITED
TECHNIQUE: Ultrasound examination of the lower extremity soft tissues was
performed in the area of clinical concern.

[Series 1: us extrem low*right* limited · 0.06mm/px · 11 acquisitions, 11 frames shown]
[im 1/11]
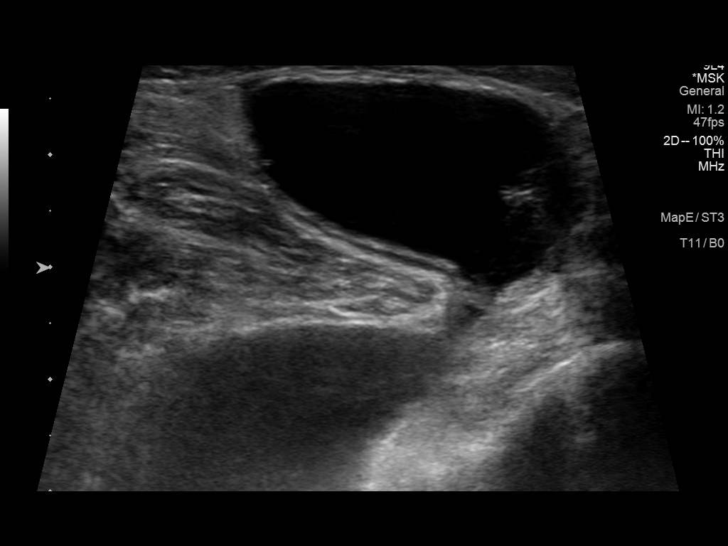
[im 2/11]
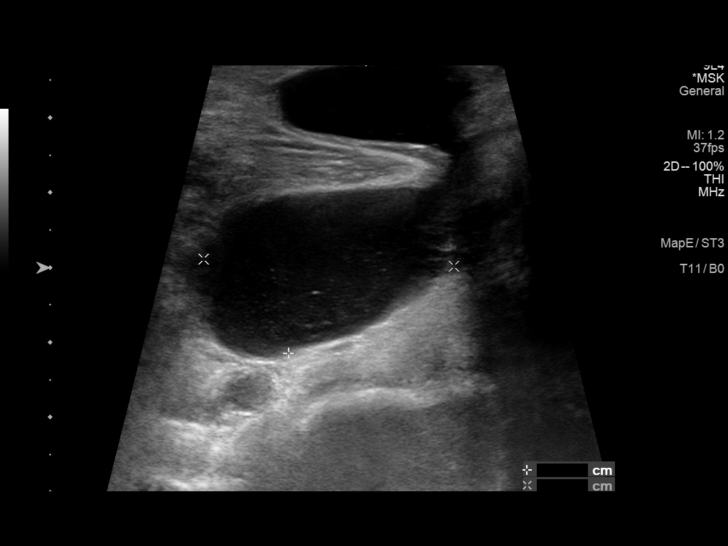
[im 3/11]
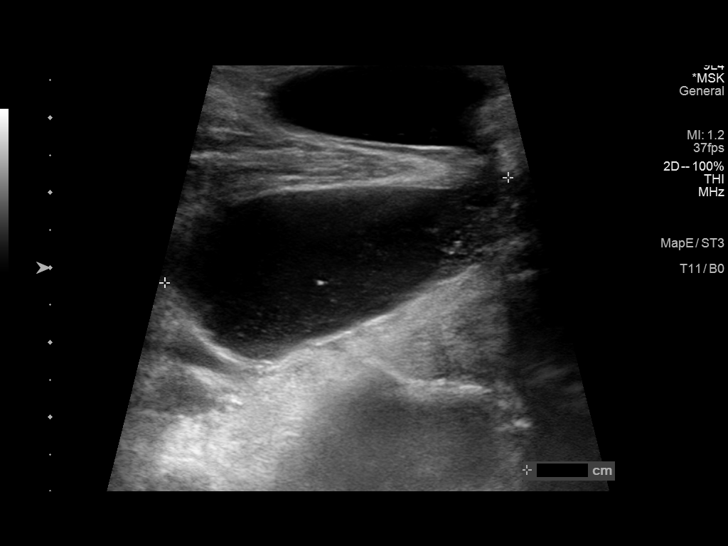
[im 4/11]
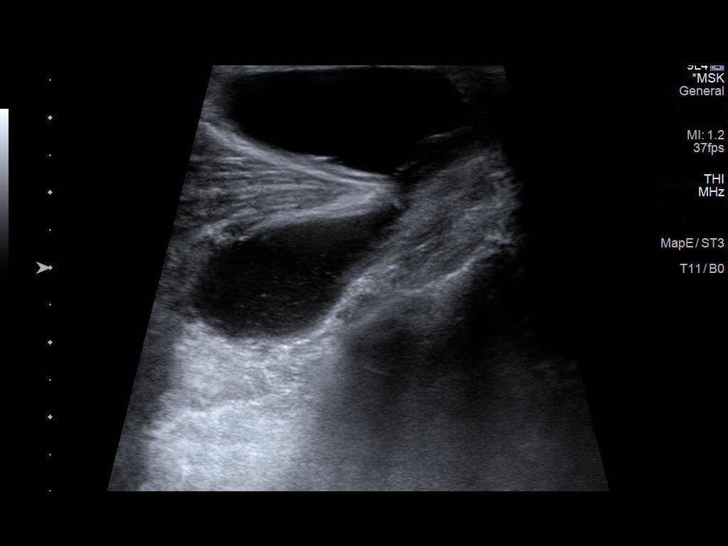
[im 5/11]
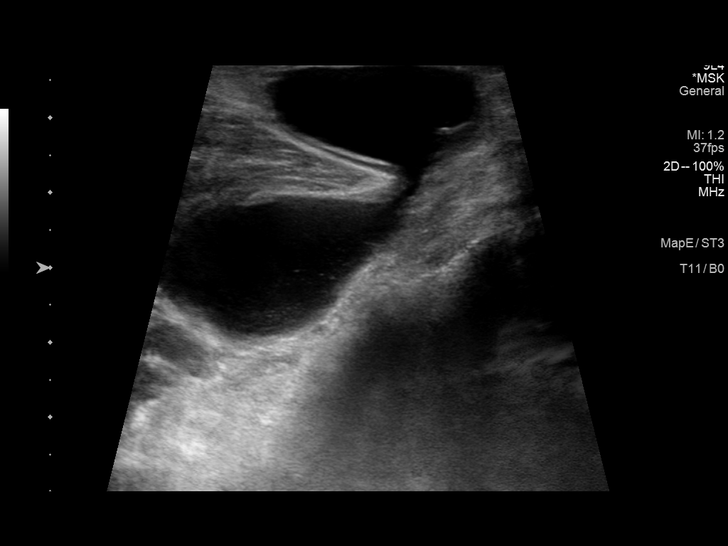
[im 6/11]
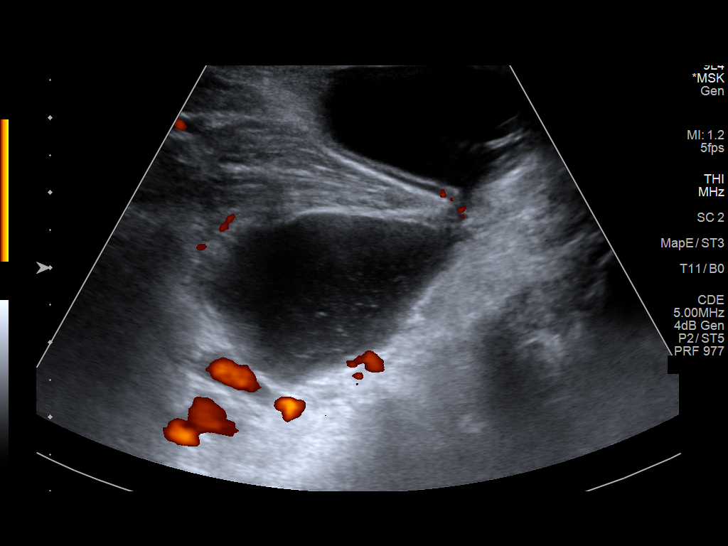
[im 7/11]
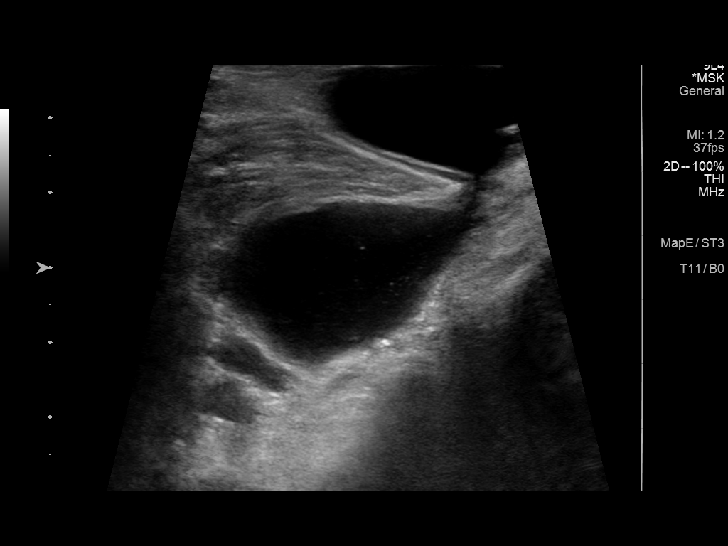
[im 8/11]
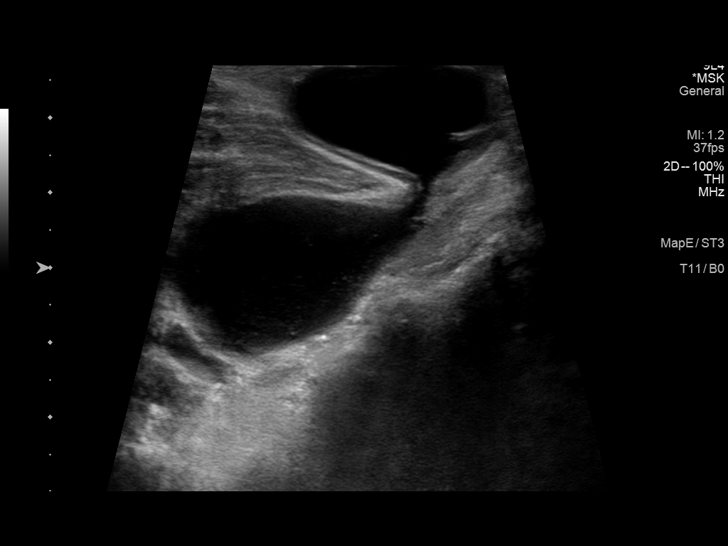
[im 9/11]
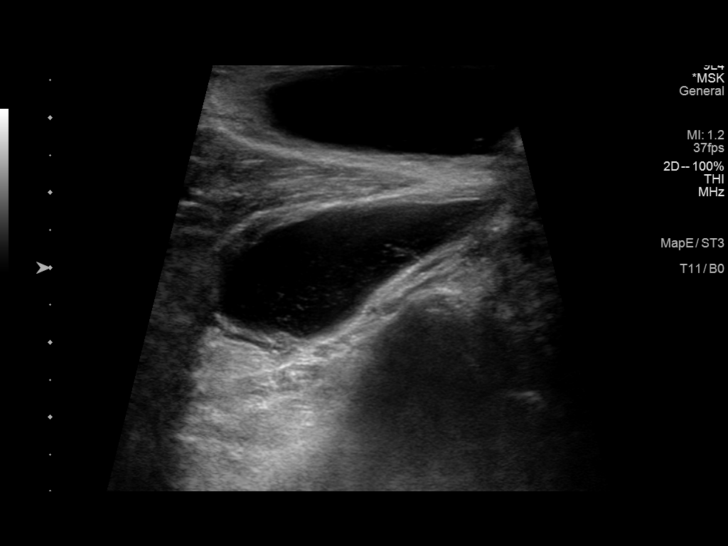
[im 10/11]
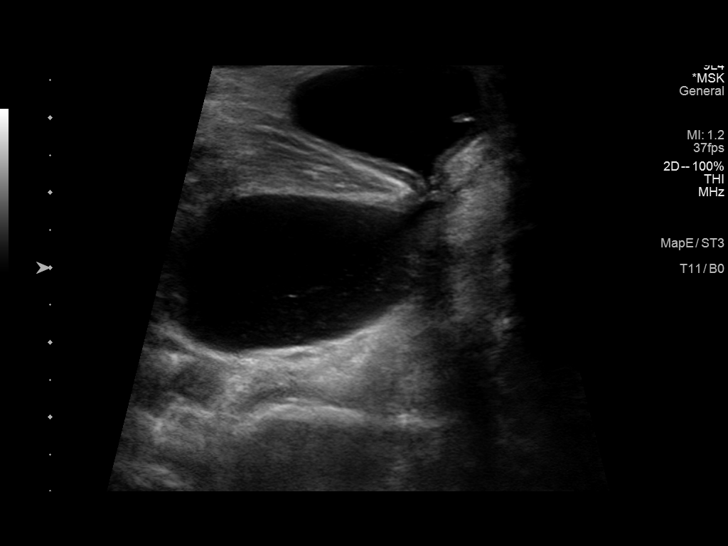
[im 11/11]
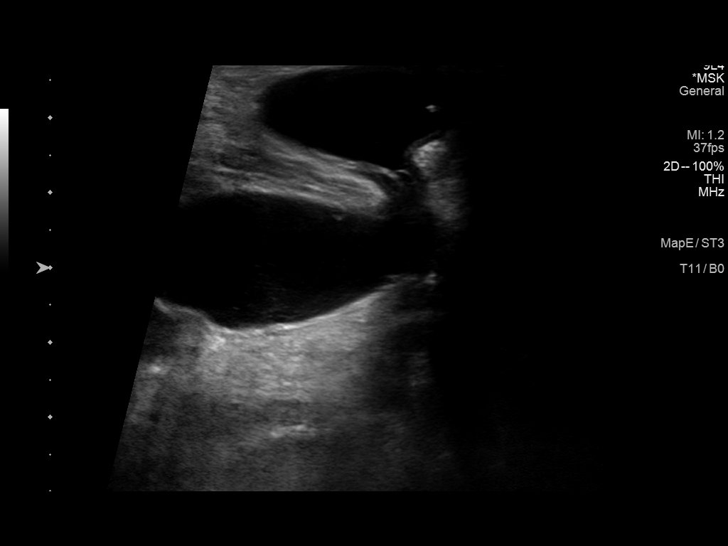

[11 of 11 positions shown; findings below may reference images not displayed]

FINDINGS: Sonographic evaluation of the palpable area in the posterior aspect
of the right knee was performed. Complex cystic structures seen
within the popliteal fossa measuring up to 4.0 x 4.8 x 3.3 cm, most
likely representing a Baker's cyst. Doppler interrogation reveals no
internal vascularity.
IMPRESSION: 1. Lobular complex right popliteal cyst most consistent with a
Baker's cyst. This corresponds to the palpable abnormality.

## 2023-01-10 NOTE — Progress Notes (Signed)
Subjective:    Patient ID: Roger Ingram, male    DOB: 01/26/1929, 89 y.o.   MRN: [id number]     HPI Roger Ingram is here for follow up of his chronic medical problems.  Doing well - nothing new.  No concerns.  Walks regularly.    Medications and allergies reviewed with patient and updated if appropriate.  Current Outpatient Medications on File Prior to Visit  Medication Sig Dispense Refill   amLODipine (NORVASC) 10 MG tablet Take 1 tablet (10 mg total) by mouth daily. 90 tablet    aspirin EC 81 MG tablet Take 81 mg by mouth daily.     cholecalciferol (VITAMIN D3) 25 MCG (1000 UNIT) tablet Take 1,000 Units by mouth daily.     finasteride (PROSCAR) 5 MG tablet TAKE 1 TABLET BY MOUTH ONCE DAILY APPT  DUE  IN  MAY  BEFORE  MORE  REFILLS 90 tablet 0   folic acid (FOLVITE) 400 MCG tablet Take 400 mcg by mouth daily.     meclizine (ANTIVERT) 12.5 MG tablet Take 1-2 tablets (12.5-25 mg total) by mouth 3 (three) times daily as needed for dizziness. 40 tablet 1   primidone (MYSOLINE) 50 MG tablet Take 1 tablet (50 mg total) by mouth 2 (two) times daily. 180 tablet 1   propranolol (INDERAL) 10 MG tablet TAKE 1 TABLET BY MOUTH TWICE DAILY FOR ESSENTIAL TREMOR 180 tablet 0   tamsulosin (FLOMAX) 0.4 MG CAPS capsule Take 1 capsule (0.4 mg total) by mouth daily. 90 capsule 2   No current facility-administered medications on file prior to visit.     Review of Systems  Constitutional:  Negative for fever.  Respiratory:  Negative for cough, shortness of breath and wheezing.   Cardiovascular:  Negative for chest pain, palpitations and leg swelling.  Genitourinary:  Positive for difficulty urinating (controlled).  Neurological:  Negative for light-headedness and headaches.       Objective:   Vitals:   01/11/23 1257  BP: (!) 140/64  Pulse: (!) 58  Temp: 98 F (36.7 C)  SpO2: 97%   BP Readings from Last 3 Encounters:  01/11/23 (!) 140/64  11/02/22 132/82  07/14/22 122/72   Wt  Readings from Last 3 Encounters:  01/11/23 142 lb (64.4 kg)  11/02/22 143 lb (64.9 kg)  07/14/22 147 lb (66.7 kg)   Body mass index is 21.59 kg/m.    Physical Exam Constitutional:      General: He is not in acute distress.    Appearance: Normal appearance. He is not ill-appearing.  HENT:     Head: Normocephalic and atraumatic.  Eyes:     Conjunctiva/sclera: Conjunctivae normal.  Cardiovascular:     Rate and Rhythm: Normal rate and regular rhythm.     Heart sounds: Normal heart sounds.  Pulmonary:     Effort: Pulmonary effort is normal. No respiratory distress.     Breath sounds: Normal breath sounds. No wheezing or rales.  Musculoskeletal:     Right lower leg: Edema (mild) present.     Left lower leg: Edema (mild) present.  Skin:    General: Skin is warm and dry.     Findings: No rash.  Neurological:     Mental Status: He is alert. Mental status is at baseline.  Psychiatric:        Mood and Affect: Mood normal.        Lab Results  Component Value Date   WBC 6.6 09/13/2021  HGB 13.4 09/13/2021   HCT 40.2 09/13/2021   PLT 197.0 09/13/2021   GLUCOSE 85 09/13/2021   CHOL 208 (H) 09/13/2021   TRIG 120.0 09/13/2021   HDL 67.20 09/13/2021   LDLDIRECT 115.6 03/05/2013   LDLCALC 117 (H) 09/13/2021   ALT 14 09/13/2021   AST 17 09/13/2021   NA 136 09/13/2021   K 4.5 09/13/2021   CL 102 09/13/2021   CREATININE 1.31 09/13/2021   BUN 31 (H) 09/13/2021   CO2 26 09/13/2021   TSH 4.49 09/13/2021   PSA 1.35 08/01/2016   INR 1.00 12/09/2014   HGBA1C 5.9 09/13/2021     Assessment & Plan:    See Problem List for Assessment and Plan of chronic medical problems.

## 2023-01-10 NOTE — Patient Instructions (Addendum)
      Blood work was ordered.   The lab is on the first floor.    Medications changes include :   none       Return in about 6 months (around 07/14/2023) for follow up.  

## 2023-01-11 ENCOUNTER — Ambulatory Visit (INDEPENDENT_AMBULATORY_CARE_PROVIDER_SITE_OTHER): Payer: Medicare Other | Admitting: Internal Medicine

## 2023-01-11 VITALS — BP 140/64 | HR 58 | Temp 98.0°F | Ht 68.0 in | Wt 142.0 lb

## 2023-01-11 DIAGNOSIS — R7303 Prediabetes: Secondary | ICD-10-CM

## 2023-01-11 DIAGNOSIS — E7849 Other hyperlipidemia: Secondary | ICD-10-CM | POA: Diagnosis not present

## 2023-01-11 DIAGNOSIS — N1831 Chronic kidney disease, stage 3a: Secondary | ICD-10-CM

## 2023-01-11 DIAGNOSIS — R350 Frequency of micturition: Secondary | ICD-10-CM

## 2023-01-11 DIAGNOSIS — N401 Enlarged prostate with lower urinary tract symptoms: Secondary | ICD-10-CM | POA: Diagnosis not present

## 2023-01-11 DIAGNOSIS — G250 Essential tremor: Secondary | ICD-10-CM

## 2023-01-11 DIAGNOSIS — I10 Essential (primary) hypertension: Secondary | ICD-10-CM

## 2023-01-11 LAB — CBC WITH DIFFERENTIAL/PLATELET
Basophils Absolute: 0.1 10*3/uL (ref 0.0–0.1)
Basophils Relative: 0.8 % (ref 0.0–3.0)
Eosinophils Absolute: 0.2 10*3/uL (ref 0.0–0.7)
Eosinophils Relative: 2.3 % (ref 0.0–5.0)
HCT: 41.7 % (ref 39.0–52.0)
Hemoglobin: 13.7 g/dL (ref 13.0–17.0)
Lymphocytes Relative: 34.1 % (ref 12.0–46.0)
Lymphs Abs: 2.4 10*3/uL (ref 0.7–4.0)
MCHC: 32.9 g/dL (ref 30.0–36.0)
MCV: 90.7 fl (ref 78.0–100.0)
Monocytes Absolute: 0.5 10*3/uL (ref 0.1–1.0)
Monocytes Relative: 7.3 % (ref 3.0–12.0)
Neutro Abs: 3.9 10*3/uL (ref 1.4–7.7)
Neutrophils Relative %: 55.5 % (ref 43.0–77.0)
Platelets: 223 10*3/uL (ref 150.0–400.0)
RBC: 4.59 Mil/uL (ref 4.22–5.81)
RDW: 13.4 % (ref 11.5–15.5)
WBC: 7.1 10*3/uL (ref 4.0–10.5)

## 2023-01-11 LAB — COMPREHENSIVE METABOLIC PANEL
ALT: 16 U/L (ref 0–53)
AST: 17 U/L (ref 0–37)
Albumin: 4 g/dL (ref 3.5–5.2)
Alkaline Phosphatase: 58 U/L (ref 39–117)
BUN: 29 mg/dL — ABNORMAL HIGH (ref 6–23)
CO2: 29 mEq/L (ref 19–32)
Calcium: 9.6 mg/dL (ref 8.4–10.5)
Chloride: 103 mEq/L (ref 96–112)
Creatinine, Ser: 1.26 mg/dL (ref 0.40–1.50)
GFR: 49.65 mL/min — ABNORMAL LOW (ref >60.00)
Glucose, Bld: 131 mg/dL — ABNORMAL HIGH (ref 70–99)
Potassium: 4.8 mEq/L (ref 3.5–5.1)
Sodium: 138 mEq/L (ref 135–145)
Total Bilirubin: 0.5 mg/dL (ref 0.2–1.2)
Total Protein: 7 g/dL (ref 6.0–8.3)

## 2023-01-11 LAB — HEMOGLOBIN A1C: Hgb A1c MFr Bld: 5.8 % (ref 4.6–6.5)

## 2023-01-11 NOTE — Assessment & Plan Note (Addendum)
Chronic Acceptable for age Continue lifestyle control

## 2023-01-11 NOTE — Assessment & Plan Note (Signed)
Chronic Following with nephrology CMP 

## 2023-01-11 NOTE — Assessment & Plan Note (Signed)
Chronic Following with Dr. Arbutus Leas On propranolol 10 mg twice daily, primidone 50 mg bid

## 2023-01-11 NOTE — Assessment & Plan Note (Signed)
Chronic Blood pressure well controlled CMP Continue amlodipine 10 mg daily, propranolol 10 mg twice daily which is mostly for his tremor 

## 2023-01-11 NOTE — Assessment & Plan Note (Signed)
Chronic Poorly controlled Continue Proscar 5 mg daily, Flomax 0.4 mg daily 

## 2023-01-11 NOTE — Assessment & Plan Note (Signed)
Chronic Check a1c Low sugar / carb diet Stressed regular exercise  

## 2023-01-25 ENCOUNTER — Other Ambulatory Visit: Payer: Self-pay | Admitting: Internal Medicine

## 2023-01-26 ENCOUNTER — Telehealth: Payer: Self-pay | Admitting: Internal Medicine

## 2023-01-26 NOTE — Telephone Encounter (Signed)
Patient called and said he hasn't received the lab results from 01/11/2023 yet. He would like to know if it can be re-sent to the address on file.  Best callback is [phone].

## 2023-01-26 NOTE — Telephone Encounter (Signed)
Labs originally sent out on 01/16/23 in the bin.   Mailed out another copy to patient today.

## 2023-03-19 ENCOUNTER — Other Ambulatory Visit: Payer: Self-pay | Admitting: Internal Medicine

## 2023-03-21 ENCOUNTER — Encounter: Payer: Self-pay | Admitting: Internal Medicine

## 2023-03-21 ENCOUNTER — Ambulatory Visit (INDEPENDENT_AMBULATORY_CARE_PROVIDER_SITE_OTHER): Payer: Medicare Other | Admitting: Internal Medicine

## 2023-03-21 VITALS — BP 126/84 | HR 50 | Temp 98.2°F | Ht 68.0 in | Wt 142.0 lb

## 2023-03-21 DIAGNOSIS — R109 Unspecified abdominal pain: Secondary | ICD-10-CM

## 2023-03-21 LAB — POCT URINALYSIS DIPSTICK
Bilirubin, UA: NEGATIVE
Blood, UA: NEGATIVE
Glucose, UA: NEGATIVE
Ketones, UA: NEGATIVE
Leukocytes, UA: NEGATIVE
Nitrite, UA: NEGATIVE
Protein, UA: NEGATIVE
Spec Grav, UA: 1.015 (ref 1.010–1.025)
Urobilinogen, UA: NEGATIVE E.U./dL — AB
pH, UA: 6 (ref 5.0–8.0)

## 2023-03-21 NOTE — Patient Instructions (Signed)
Try using tylenol to see if this helps the pain. You do not have a urine infection.

## 2023-03-21 NOTE — Progress Notes (Signed)
   Subjective:   Patient ID: Roger Ingram, male    DOB: Jan 01, 1929, 89 y.o.   MRN: [id number]  Flank Pain Pertinent negatives include no abdominal pain or chest pain.   The patient is a 89 YO man coming in for left flank pain. Started a few days ago and is improving. Feels similar to prior kidney stone. Has prostate issues overall those are stable.   Review of Systems  Constitutional: Negative.   HENT: Negative.    Eyes: Negative.   Respiratory:  Negative for cough, chest tightness and shortness of breath.   Cardiovascular:  Negative for chest pain, palpitations and leg swelling.  Gastrointestinal:  Negative for abdominal distention, abdominal pain, constipation, diarrhea, nausea and vomiting.  Genitourinary:  Positive for flank pain.  Skin: Negative.   Neurological: Negative.   Psychiatric/Behavioral: Negative.      Objective:  Physical Exam Constitutional:      Appearance: He is well-developed.  HENT:     Head: Normocephalic and atraumatic.  Cardiovascular:     Rate and Rhythm: Normal rate and regular rhythm.  Pulmonary:     Effort: Pulmonary effort is normal. No respiratory distress.     Breath sounds: Normal breath sounds. No wheezing or rales.  Abdominal:     General: Bowel sounds are normal. There is no distension.     Palpations: Abdomen is soft.     Tenderness: There is no abdominal tenderness. There is no rebound.  Musculoskeletal:     Cervical back: Normal range of motion.  Skin:    General: Skin is warm and dry.  Neurological:     Mental Status: He is alert and oriented to person, place, and time.     Coordination: Coordination normal.     Vitals:   03/21/23 1031  BP: 126/84  Pulse: (!) 50  Temp: 98.2 F (36.8 C)  TempSrc: Oral  SpO2: 98%  Weight: 142 lb (64.4 kg)  Height: 5\' 8"  (1.727 m)    Assessment & Plan:

## 2023-03-21 NOTE — Assessment & Plan Note (Signed)
Having some left flank pain which is improving going on 3-4 days. He states this is similar to prior kidney stone which is possible. POC U/A checked and no infection today. I see through chart review he had a questionable kidney lesion 2019 which was not followed up. He has talked to nephrology and they offered [place] he declined. He declines that again today. Asked him to try tylenol for pain and hydrate. If not improving he will let us know.

## 2023-04-06 ENCOUNTER — Other Ambulatory Visit: Payer: Self-pay | Admitting: Internal Medicine

## 2023-04-13 NOTE — Progress Notes (Signed)
Subjective:    Patient ID: Roger Ingram, male    DOB: 09/18/1928, 89 y.o.   MRN: [id number]      HPI Roger Ingram is here for  Chief Complaint  Patient presents with   Medical Management of Chronic Issues    THROAT BLOCKAGE, AFFECTS SPEECH, WORSE IN THE MORNINGS     Feels like he has a blockage in his throat - no pain.  When he eats something he has trouble getting it down - it takes fluids to help get it down.  Feels like it is gets stuck in his throat.  If he drinks something he gets strangled because it goes in his airway - that happens a lot.   It started in the past couple of months.    Sometimes forming words and having a running conversation is not easy because of forming the words.  He feels like he has to burp and he can't - it won't come out -- usually worse early morning.    Has very dry mouth in the night and morning.  He is not sure if he sleeps with his mouth open.  Today is not as bad of the day and he almost canceled it, but this has been persistent.    Medications and allergies reviewed with patient and updated if appropriate.  Current Outpatient Medications on File Prior to Visit  Medication Sig Dispense Refill   amLODipine (NORVASC) 10 MG tablet Take 1 tablet (10 mg total) by mouth daily. 90 tablet    aspirin EC 81 MG tablet Take 81 mg by mouth daily.     cholecalciferol (VITAMIN D3) 25 MCG (1000 UNIT) tablet Take 1,000 Units by mouth daily.     finasteride (PROSCAR) 5 MG tablet TAKE 1 TABLET BY MOUTH ONCE DAILY . APPOINTMENT REQUIRED FOR FUTURE REFILLS 90 tablet 0   folic acid (FOLVITE) 400 MCG tablet Take 400 mcg by mouth daily.     tamsulosin (FLOMAX) 0.4 MG CAPS capsule Take 1 capsule by mouth once daily 90 capsule 1   meclizine (ANTIVERT) 12.5 MG tablet Take 1-2 tablets (12.5-25 mg total) by mouth 3 (three) times daily as needed for dizziness. (Patient not taking: Reported on 04/14/2023) 40 tablet 1   primidone (MYSOLINE) 50 MG tablet Take 1 tablet (50 mg  total) by mouth 2 (two) times daily. (Patient not taking: Reported on 04/14/2023) 180 tablet 1   propranolol (INDERAL) 10 MG tablet TAKE 1 TABLET BY MOUTH TWICE DAILY FOR  ESSENTIAL  TREMOR (Patient not taking: Reported on 04/14/2023) 180 tablet 0   No current facility-administered medications on file prior to visit.    Review of Systems  Constitutional:  Negative for fever.  HENT:  Positive for trouble swallowing.   Respiratory:  Positive for choking. Negative for cough, shortness of breath and wheezing.   Cardiovascular:  Negative for chest pain.  Gastrointestinal:  Negative for abdominal pain and nausea.       No gerd       Objective:   Vitals:   04/14/23 1433  BP: 130/72  Pulse: (!) 53  Temp: 97.6 F (36.4 C)  SpO2: 94%   BP Readings from Last 3 Encounters:  04/14/23 130/72  03/21/23 126/84  01/11/23 (!) 140/64   Wt Readings from Last 3 Encounters:  04/14/23 141 lb (64 kg)  03/21/23 142 lb (64.4 kg)  01/11/23 142 lb (64.4 kg)   Body mass index is 21.44 kg/m.    Physical Exam Constitutional:  General: He is not in acute distress.    Appearance: Normal appearance. He is not ill-appearing.  HENT:     Head: Normocephalic and atraumatic.     Mouth/Throat:     Mouth: Mucous membranes are moist.     Pharynx: No oropharyngeal exudate or posterior oropharyngeal erythema.  Eyes:     Conjunctiva/sclera: Conjunctivae normal.  Cardiovascular:     Rate and Rhythm: Normal rate and regular rhythm.     Heart sounds: Normal heart sounds.  Pulmonary:     Effort: Pulmonary effort is normal. No respiratory distress.     Breath sounds: Normal breath sounds. No wheezing or rales.  Musculoskeletal:     Cervical back: Neck supple. No tenderness.     Right lower leg: No edema.     Left lower leg: No edema.  Lymphadenopathy:     Cervical: No cervical adenopathy.  Skin:    General: Skin is warm and dry.     Findings: No rash.  Neurological:     Mental Status: He is alert.  Mental status is at baseline.  Psychiatric:        Mood and Affect: Mood normal.            Assessment & Plan:    See Problem List for Assessment and Plan of chronic medical problems.

## 2023-04-14 ENCOUNTER — Ambulatory Visit: Payer: Medicare Other | Admitting: Internal Medicine

## 2023-04-14 ENCOUNTER — Encounter: Payer: Self-pay | Admitting: Internal Medicine

## 2023-04-14 VITALS — BP 130/72 | HR 53 | Temp 97.6°F | Wt 141.0 lb

## 2023-04-14 DIAGNOSIS — I10 Essential (primary) hypertension: Secondary | ICD-10-CM

## 2023-04-14 DIAGNOSIS — R1310 Dysphagia, unspecified: Secondary | ICD-10-CM | POA: Diagnosis not present

## 2023-04-14 NOTE — Assessment & Plan Note (Signed)
Chronic Blood pressure well controlled Continue amlodipine 10 mg daily, propranolol 10 mg twice daily which is mostly for his tremor

## 2023-04-14 NOTE — Assessment & Plan Note (Signed)
Acute Started about 2 months ago States food gets stuck in his throat-not lower and he has to drink water to help it go down.  Seems to occur with all foods Also difficulty drinking at times-water will go into his airway and not down his throat which causes choking Also states some difficulty forming words at times Symptoms have gotten worse over the past couple of months ?  Obstruction-mass Esophageal dysmotility, stricture-denies any GERD Modified barium swallow, referral to GI Depending on modified barium swallow results may need CT of neck

## 2023-04-14 NOTE — Patient Instructions (Addendum)
      A swallow evaluation was ordered - this will be done at [facility] long [facility].     Medications changes include :   none    A referral was ordered GI and someone will call you to schedule an appointment.     Return if symptoms worsen or fail to improve.

## 2023-04-18 ENCOUNTER — Telehealth (HOSPITAL_COMMUNITY): Payer: Self-pay | Admitting: *Deleted

## 2023-04-18 NOTE — Telephone Encounter (Signed)
Attempted to contact patient to schedule OP MBS. Left VM. RKEEL 

## 2023-04-19 ENCOUNTER — Telehealth: Payer: Self-pay | Admitting: Internal Medicine

## 2023-04-19 NOTE — Telephone Encounter (Signed)
Patient would like someone to call him about his swallow test that he discussed with Dr. Lawerance Bach -  Please call him at:  [phone]

## 2023-04-20 ENCOUNTER — Telehealth: Payer: Self-pay

## 2023-04-20 NOTE — Telephone Encounter (Signed)
Error

## 2023-04-24 ENCOUNTER — Telehealth (HOSPITAL_COMMUNITY): Payer: Self-pay | Admitting: *Deleted

## 2023-04-24 NOTE — Telephone Encounter (Signed)
Attempted to contact patient to schedule OP MBS. Left VM. RKEEL 

## 2023-05-02 ENCOUNTER — Telehealth (HOSPITAL_COMMUNITY): Payer: Self-pay | Admitting: *Deleted

## 2023-05-02 ENCOUNTER — Encounter: Payer: Self-pay | Admitting: Gastroenterology

## 2023-05-02 ENCOUNTER — Ambulatory Visit (INDEPENDENT_AMBULATORY_CARE_PROVIDER_SITE_OTHER): Payer: Medicare Other | Admitting: Gastroenterology

## 2023-05-02 VITALS — BP 134/74 | HR 57 | Ht 68.0 in | Wt 141.2 lb

## 2023-05-02 DIAGNOSIS — R1310 Dysphagia, unspecified: Secondary | ICD-10-CM | POA: Diagnosis not present

## 2023-05-02 DIAGNOSIS — R1312 Dysphagia, oropharyngeal phase: Secondary | ICD-10-CM | POA: Diagnosis not present

## 2023-05-02 NOTE — Patient Instructions (Signed)
You have been scheduled for a Barium Esophogram at [facility] Radiology (1st floor of the hospital) on 05/12/2023 at 10:00am. Please arrive 30 minutes prior to your appointment for registration. Make certain not to have anything to eat or drink 3 hours prior to your test. If you need to reschedule for any reason, please contact radiology at [phone] to do so. __________________________________________________________________ A barium swallow is an examination that concentrates on views of the esophagus. This tends to be a double contrast exam (barium and two liquids which, when combined, create a gas to distend the wall of the oesophagus) or single contrast (non-ionic iodine based). The study is usually tailored to your symptoms so a good history is essential. Attention is paid during the study to the form, structure and configuration of the esophagus, looking for functional disorders (such as aspiration, dysphagia, achalasia, motility and reflux) EXAMINATION You may be asked to change into a gown, depending on the type of swallow being performed. A radiologist and radiographer will perform the procedure. The radiologist will advise you of the type of contrast selected for your procedure and direct you during the exam. You will be asked to stand, sit or lie in several different positions and to hold a small amount of fluid in your mouth before being asked to swallow while the imaging is performed .In some instances you may be asked to swallow barium coated marshmallows to assess the motility of a solid food bolus. The exam can be recorded as a digital or video fluoroscopy procedure. POST PROCEDURE It will take 1-2 days for the barium to pass through your system. To facilitate this, it is important, unless otherwise directed, to increase your fluids for the next 24-48hrs and to resume your normal diet.  This test typically takes about 30 minutes to  perform. ___________________________________________________________________  Roger Ingram have been scheduled for a modified barium swallow on ____ at ______. Please arrive 30 minutes prior to your test for registration. You will go to _____ Radiology (1st Floor) for your appointment. Should you need to cancel or reschedule your appointment, please contact [phone] Pat[facility] D[facility] [place]) or [phone] [facility]). ___________________________________________________________________  A Modified Barium Swallow Study, or MBS, is a special x-ray that is taken to check swallowing skills. It is carried out by a Marine scientist and a Warehouse manager (SLP). During this test, yourmouth, throat, and esophagus, a muscular tube which connects your mouth to your stomach, is checked. The test will help you, your doctor, and the SLP plan what types of foods and liquids are easier for you to swallow. The SLP will also identify positions and ways to help you swallow more easily and safely. What will happen during an MBS? You will be taken to an x-ray room and seated comfortably. You will be asked to swallow small amounts of food and liquid mixed with barium. Barium is a liquid or paste that allows images of your mouth, throat and esophagus to be seen on x-ray. The x-ray captures moving images of the food you are swallowing as it travels from your mouth through your throat and into your esophagus. This test helps identify whether food or liquid is entering your lungs (aspiration). The test also shows which part of your mouth or throat lacks strength or coordination to move the food or liquid in the right direction. This test typically takes 30 minutes to 1 hour to complete. _______________________________________________________________________

## 2023-05-02 NOTE — Telephone Encounter (Signed)
Third attempt to contact patient to schedule OP MBS. Left VM. RKEEL

## 2023-05-02 NOTE — Progress Notes (Signed)
HPI :  89 year old male with a history of CKD, BPH, renal stones, colon polyps, here for new patient visit, referred for dysphagia.  He says for the past 3 months he is experienced dysphagia when swallowing solids.  He states food can get stuck he feels in his throat area to upper chest and then has to often drink fluids to push things through.  This will happen most times when he is eating.  Denies any impaction.  He has occasional heartburn but nothing significant, he does not really need to take anything for it.  He denies any nausea or vomiting.  He has not had any weight loss that he is aware of.  He never really had dysphagia before the past few months.  He has never had an upper endoscopy.  He additionally endorses problems with coughing when he drinks liquids.  He states liquids tend to, at times, "go down the wrong pipe".  He states he coughs violently at times when he is drinking liquids as he feels he goes into his lungs.  This is been going on for the past 6 weeks or so.  He does have some voice change along with some of the symptoms.  He denies any history of tobacco use.  Otherwise he is feeling well without complaints.  He denies any cardiopulmonary symptoms.  Denies any problems with his bowels.  Sometimes he can have increased gas bloating.  He denies any blood in his stools.  He had a 9 mm polyp removed in 2011 at the time of his last colonoscopy.  Colonoscopy 05/2010: 9mm rectosigmoid polyp, diverticulosis, hemorrhoids  Past Medical History:  Diagnosis Date   Basal cell cancer    BPH (benign prostatic hypertrophy)    Dr Mena Goes   Chronic kidney disease    Hx of clear fluid sacs on both kidneys. Drained about 2006   Diverticulosis of colon    Elevated PSA    [facility]   History of kidney stones    Hx of colonic polyps    Hyperlipidemia    Hypertension    Melanoma (HCC)      Past Surgical History:  Procedure Laterality Date   basal cell     R  & L temple;  Dr Terri Piedra   COLONOSCOPY W/ POLYPECTOMY  1997, 2011   Dr Kinnie Scales   melanoma     L forehead   Right rotator cuff sx     TOTAL HIP ARTHROPLASTY Left 12/19/2014   Procedure: LEFT TOTAL HIP ARTHROPLASTY ANTERIOR APPROACH;  Surgeon: Kathryne Hitch, MD;  Location: [facility];  Service: Orthopedics;  Laterality: Left;   Family History  Problem Relation Age of Onset   Colon cancer Brother 68   Other Sister        addisons disease   Dementia Sister    Stroke Brother    Stroke Mother 74   Heart attack Father 72   Stroke Father    Stroke Paternal Grandfather        in 53s   Diabetes Son        IDDM   Crohn's disease Daughter    Drug abuse Son    Social History   Tobacco Use   Smoking status: Never   Smokeless tobacco: Never  Vaping Use   Vaping status: Never Used  Substance Use Topics   Alcohol use: No    Alcohol/week: 0.0 standard drinks of alcohol   Drug use: No   Current Outpatient Medications  Medication Sig Dispense Refill   amLODipine (NORVASC) 10 MG tablet Take 1 tablet (10 mg total) by mouth daily. 90 tablet    aspirin EC 81 MG tablet Take 81 mg by mouth daily.     cholecalciferol (VITAMIN D3) 25 MCG (1000 UNIT) tablet Take 1,000 Units by mouth daily.     finasteride (PROSCAR) 5 MG tablet TAKE 1 TABLET BY MOUTH ONCE DAILY . APPOINTMENT REQUIRED FOR FUTURE REFILLS 90 tablet 0   folic acid (FOLVITE) 400 MCG tablet Take 400 mcg by mouth daily.     propranolol (INDERAL) 10 MG tablet TAKE 1 TABLET BY MOUTH TWICE DAILY FOR  ESSENTIAL  TREMOR 180 tablet 0   tamsulosin (FLOMAX) 0.4 MG CAPS capsule Take 1 capsule by mouth once daily 90 capsule 1   meclizine (ANTIVERT) 12.5 MG tablet Take 1-2 tablets (12.5-25 mg total) by mouth 3 (three) times daily as needed for dizziness. (Patient not taking: Reported on 04/14/2023) 40 tablet 1   primidone (MYSOLINE) 50 MG tablet Take 1 tablet (50 mg total) by mouth 2 (two) times daily. (Patient not taking: Reported on 04/14/2023) 180 tablet 1    No current facility-administered medications for this visit.   Allergies  Allergen Reactions   Hydrocodone Other (See Comments)    Double vision / diplopia   Iohexol Other (See Comments)    Syncope post IV dye for renal calculi 1963     Review of Systems: All systems reviewed and negative except where noted in HPI.   Lab Results  Component Value Date   WBC 7.1 01/11/2023   HGB 13.7 01/11/2023   HCT 41.7 01/11/2023   MCV 90.7 01/11/2023   PLT 223.0 01/11/2023    Lab Results  Component Value Date   NA 138 01/11/2023   CL 103 01/11/2023   K 4.8 01/11/2023   CO2 29 01/11/2023   BUN 29 (H) 01/11/2023   CREATININE 1.26 01/11/2023   GFR 49.65 (L) 01/11/2023   CALCIUM 9.6 01/11/2023   ALBUMIN 4.0 01/11/2023   GLUCOSE 131 (H) 01/11/2023    Lab Results  Component Value Date   ALT 16 01/11/2023   AST 17 01/11/2023   ALKPHOS 58 01/11/2023   BILITOT 0.5 01/11/2023     Physical Exam: BP 134/74   Pulse (!) 57   Ht 5\' 8"  (1.727 m)   Wt 141 lb 4 oz (64.1 kg)   SpO2 96%   BMI 21.48 kg/m  Constitutional: Pleasant,well-developed, male in no acute distress. HEENT: Normocephalic and atraumatic. Conjunctivae are normal. No scleral icterus. Neck supple.  Cardiovascular: Normal rate, regular rhythm.  Pulmonary/chest: Effort normal and breath sounds normal.  Abdominal: Soft, nondistended, nontender.There are no masses palpable. Extremities: no edema Lymphadenopathy: No cervical adenopathy noted. Neurological: Alert and oriented to person place and time. Skin: Skin is warm and dry. No rashes noted. Psychiatric: Normal mood and affect. Behavior is normal.   ASSESSMENT: 89 y.o. male here for assessment of the following  1. Dysphagia, unspecified type   2. Oropharyngeal dysphagia    Patient with what he describes as two separate types of dysphagia.  He is having difficulty swallowing solids, drinking fluids to push food down and having occasional regurgitation of  swallowed food.  This is new for him over the past few months and appears to be progressively getting worse.  He also endorses some oropharyngeal dysphagia with what sounds like aspiration of liquids at times as well.  He is not losing weight.  He otherwise feels  well.  He has never had these types of symptoms before.  Discussed differential diagnosis with him.  Will pursue barium swallow with tablet initially to get done ASAP to see if this will help clarify what is driving his symptoms, given his age. Rule out mass lesion, stricture, Zenker's given his regurgitation of good.  Very possible he may need an upper endoscopy to further evaluate this and he understands this. He denies any significant cardiopulmonary comorbidities should he need this, I think he could tolerate anesthesia.  Will also order a modified barium swallow with speech path to assess his swallowing function in light of suspected episodes of aspiration when drinking just liquids. Can do this if the barium study fails to show a clear cause for this.   Hopefull we can get these studies done quickly and make decision about endoscopy / further evaluation soon, and add him to the schedule if needed. He should stay on a soft diet, avoid meats / chicken in the interim to avoid impaction. He agrees.   PLAN:  - barium swallow with tablet ASAP - modified barium swallow with speech path - stay on soft diet, avoid chicken / meats to minimize risk of impaction - likely will need EGD pending course, can add on in near future if needed.   Harlin Rain, MD [facility]  CC: Pincus Sanes, MD

## 2023-05-04 ENCOUNTER — Other Ambulatory Visit (HOSPITAL_COMMUNITY): Payer: Self-pay | Admitting: *Deleted

## 2023-05-04 DIAGNOSIS — R059 Cough, unspecified: Secondary | ICD-10-CM

## 2023-05-04 DIAGNOSIS — R1310 Dysphagia, unspecified: Secondary | ICD-10-CM

## 2023-05-12 ENCOUNTER — Ambulatory Visit (HOSPITAL_COMMUNITY)
Admission: RE | Admit: 2023-05-12 | Discharge: 2023-05-12 | Disposition: A | Discharge status: Home / Self Care | Payer: Medicare Other | Source: Ambulatory Visit | Attending: Gastroenterology | Admitting: Gastroenterology

## 2023-05-12 DIAGNOSIS — R1312 Dysphagia, oropharyngeal phase: Secondary | ICD-10-CM | POA: Insufficient documentation

## 2023-05-12 DIAGNOSIS — R1310 Dysphagia, unspecified: Secondary | ICD-10-CM | POA: Diagnosis not present

## 2023-05-12 DIAGNOSIS — R221 Localized swelling, mass and lump, neck: Secondary | ICD-10-CM | POA: Diagnosis not present

## 2023-05-17 ENCOUNTER — Ambulatory Visit (HOSPITAL_COMMUNITY)
Admission: RE | Admit: 2023-05-17 | Discharge: 2023-05-17 | Disposition: A | Discharge status: Home / Self Care | Payer: Medicare Other | Source: Ambulatory Visit | Attending: Gastroenterology | Admitting: Gastroenterology

## 2023-05-17 DIAGNOSIS — Z860100 Personal history of colon polyps, unspecified: Secondary | ICD-10-CM | POA: Diagnosis not present

## 2023-05-17 DIAGNOSIS — R1310 Dysphagia, unspecified: Secondary | ICD-10-CM | POA: Insufficient documentation

## 2023-05-17 DIAGNOSIS — R059 Cough, unspecified: Secondary | ICD-10-CM | POA: Insufficient documentation

## 2023-05-17 DIAGNOSIS — Z87442 Personal history of urinary calculi: Secondary | ICD-10-CM | POA: Diagnosis not present

## 2023-05-17 DIAGNOSIS — W44F3XA Food entering into or through a natural orifice, initial encounter: Secondary | ICD-10-CM | POA: Diagnosis not present

## 2023-05-17 DIAGNOSIS — N189 Chronic kidney disease, unspecified: Secondary | ICD-10-CM | POA: Insufficient documentation

## 2023-05-17 DIAGNOSIS — R933 Abnormal findings on diagnostic imaging of other parts of digestive tract: Secondary | ICD-10-CM | POA: Diagnosis not present

## 2023-05-17 DIAGNOSIS — R1312 Dysphagia, oropharyngeal phase: Secondary | ICD-10-CM

## 2023-05-17 DIAGNOSIS — I129 Hypertensive chronic kidney disease with stage 1 through stage 4 chronic kidney disease, or unspecified chronic kidney disease: Secondary | ICD-10-CM | POA: Diagnosis not present

## 2023-05-17 NOTE — Addendum Note (Signed)
Encounter addended by: Angela Nevin, CCC-SLP on: 05/17/2023 4:17 PM  Actions taken: Flowsheet accepted

## 2023-05-17 NOTE — Therapy (Addendum)
I have reviewed and concur with this student's documentation.  Angela Nevin, MA, CCC-SLP Speech Therapy  05/17/2023 4:13 PM    Modified Barium Swallow Study  Patient Details  Name: KERI TAVELLA MRN: [id number] Date of Birth: 1928-09-24  Today's Date: 05/17/2023  Modified Barium Swallow completed.  Full report located under Chart Review in the Imaging Section.  History of Present Illness Mr. Gergory Biello is a 89 y.o. male with a PMH significant for CKD, BMH, renal stones, and colon polyps. He presents today for a modified barium swallow study per complaints of coughing when drinking liquids, voice changes after eating/drinking, and feeling like food is stuck in his throat.   Clinical Impression Mr. Flanigan was seen by SLP for modified barium swallow study per his complaints of violent coughing after drinking liquids and sensation of food getting stuck. Mr. Sagan presents with an overall functional oropharyngeal swallow given his age. Penetration occurred with thin liquids, however, it was mostly shallow penetration of trace amounts that expelled from the airway. Mr. Peart had great sensation and nearly always could identify when a bolus entered his airway. Sensed aspiration occurred once with a large bolus via straw. Aspirate expelled from the airway. Multiple swallows were necessary for puree and solid food consistencies. When initiating second swallow for solid food bolus, residual of previous swallow was expelled back into the oral cavity. upon a third swallow, laryngeal clearance was achieved. Hyolaryngeal excursion and epiglottic inversion were both complete and coordinated. Mr. Gater deficits appeared to primarily be caused by a prominent cricopharyngeal bar along with questionable mild stricture. SLP recommended Mr. Ells continue with his regular diet and thin liquids. He was advised to take small sips/bites and utilize extra sauce or gravy as necessary to assist in the clearance  of solid foods. Medication may be taken with puree or pudding like consistencies. No further ST is necessary at this time.   Swallow Evaluation Recommendations Recommendations: PO diet PO Diet Recommendation: Regular;Thin liquids (Level 0) Liquid Administration via: Spoon;Cup;Straw Medication Administration: Whole meds with puree Supervision: Patient able to self-feed Swallowing strategies  : Minimize environmental distractions;Small bites/sips;Slow rate Postural changes: Position pt fully upright for meals;Stay upright 30-60 min after meals Oral care recommendations: Oral care BID (2x/day)      Marline Backbone, B.S., Speech Therapy Student   05/17/2023,3:31 PM

## 2023-05-19 ENCOUNTER — Telehealth: Payer: Self-pay | Admitting: Internal Medicine

## 2023-05-19 NOTE — Telephone Encounter (Signed)
Patient would like someone to call him to discuss results of the 2 barium swallow tests that patient has completed.  Please call at [phone]

## 2023-05-22 NOTE — Telephone Encounter (Signed)
Patient should contact Gastro for results.

## 2023-05-23 NOTE — Telephone Encounter (Signed)
Gastro called patient yesterday and went over results.

## 2023-05-24 ENCOUNTER — Telehealth: Payer: Self-pay | Admitting: Gastroenterology

## 2023-05-24 DIAGNOSIS — R1310 Dysphagia, unspecified: Secondary | ICD-10-CM

## 2023-05-24 DIAGNOSIS — R1312 Dysphagia, oropharyngeal phase: Secondary | ICD-10-CM

## 2023-05-24 NOTE — Telephone Encounter (Signed)
Patient calls with questions about exactly how endoscopy is performed, what it checks for etc. Those questions have been answered to the patient's satisfaction and he now is comfortable with proceeding to endoscopy in [facility].   Endoscopy scheduled for 06/16/23 at 11 am. Patient has been advised of time/date/location of upcoming procedure and has been given verbal prep instructions. He has also been advised that he will need a care partner 18 years or older to bring him, stay for the procedure and drive him home due to sedation. Written instructions have also been made available to the patient for additional review via mail.

## 2023-05-24 NOTE — Telephone Encounter (Signed)
Inbound call from patient requesting a call to discuss possible endoscopy procedure. Patient stating he has a few questions he would like to discuss before proceeding. Please advise, thank you.

## 2023-05-24 NOTE — Addendum Note (Signed)
Addended by: Richardson Chiquito on: 05/24/2023 11:59 AM   Modules accepted: Orders

## 2023-06-08 ENCOUNTER — Ambulatory Visit: Payer: Medicare Other

## 2023-06-08 VITALS — BP 134/61 | HR 60 | Ht 68.0 in | Wt 138.0 lb

## 2023-06-08 DIAGNOSIS — Z0000 Encounter for general adult medical examination without abnormal findings: Secondary | ICD-10-CM

## 2023-06-08 NOTE — Patient Instructions (Signed)
[name] , Thank you for taking time to come for your Medicare Wellness Visit. I appreciate your ongoing commitment to your health goals. Please review the following plan we discussed and let me know if I can assist you in the future.   Referrals/Orders/Follow-Ups/Clinician Recommendations: It was a pleasure speaking with you today and keep up the good work.  This is a list of the screening recommended for you and due dates:  Health Maintenance  Topic Date Due   Zoster (Shingles) Vaccine (1 of 2) Never done   Flu Shot  03/16/2023   COVID-19 Vaccine (5 - 2023-24 season) 04/16/2023   Medicare Annual Wellness Visit  06/07/2024   DTaP/Tdap/Td vaccine (2 - Td or Tdap) 01/15/2031   Pneumonia Vaccine  Completed   HPV Vaccine  Aged Out    Advanced directives: (Copy Requested) Please bring a copy of your health care power of attorney and living will to the office to be added to your chart at your convenience.  Next Medicare Annual Wellness Visit scheduled for next year: Yes

## 2023-06-08 NOTE — Progress Notes (Signed)
Subjective:   Roger Ingram is a 89 y.o. male who presents for Medicare Annual/Subsequent preventive examination.  Visit Complete: Virtual I connected with  Remi Deter on 06/08/23 by a audio enabled telemedicine application and verified that I am speaking with the correct person using two identifiers.  Patient Location: Home  Provider Location: Home Office  I discussed the limitations of evaluation and management by telemedicine. The patient expressed understanding and agreed to proceed.  Vital Signs: Because this visit was a virtual/telehealth visit, some criteria may be missing or patient reported. Any vitals not documented were not able to be obtained and vitals that have been documented are patient reported.   Cardiac Risk Factors include: advanced age (>9men, >40 women);hypertension;male gender;Other (see comment);dyslipidemia, Risk factor comments: BPH, CKD     Objective:    Today's Vitals   06/08/23 1423  Weight: 138 lb (62.6 kg)  Height: 5\' 8"  (1.727 m)   Body mass index is 20.98 kg/m.     06/08/2023    2:39 PM 05/16/2022    3:35 PM 03/21/2022    1:54 PM 12/08/2021    9:43 AM 12/09/2019    8:09 AM 04/05/2019    8:19 AM 12/09/2014    9:17 AM  Advanced Directives  Does Patient Have a Medical Advance Directive? Yes Yes Yes Yes Yes Yes Yes  Type of Estate agent of [place];Living will Healthcare Power of [place];Living will Healthcare Power of [place];Living will Living will Healthcare Power of [place];Living will Living will;Healthcare Power of [employer] Power of [place];Living will  Does patient want to make changes to medical advance directive?       No - Patient declined  Copy of Healthcare Power of Attorney in Chart? No - copy requested No - copy requested     Yes    Current Medications (verified) Outpatient Encounter Medications as of 06/08/2023  Medication Sig   amLODipine (NORVASC) 10 MG tablet Take 1 tablet (10 mg total)  by mouth daily.   aspirin EC 81 MG tablet Take 81 mg by mouth daily.   cholecalciferol (VITAMIN D3) 25 MCG (1000 UNIT) tablet Take 1,000 Units by mouth daily.   finasteride (PROSCAR) 5 MG tablet TAKE 1 TABLET BY MOUTH ONCE DAILY . APPOINTMENT REQUIRED FOR FUTURE REFILLS   folic acid (FOLVITE) 400 MCG tablet Take 400 mcg by mouth daily.   propranolol (INDERAL) 10 MG tablet TAKE 1 TABLET BY MOUTH TWICE DAILY FOR  ESSENTIAL  TREMOR   tamsulosin (FLOMAX) 0.4 MG CAPS capsule Take 1 capsule by mouth once daily   meclizine (ANTIVERT) 12.5 MG tablet Take 1-2 tablets (12.5-25 mg total) by mouth 3 (three) times daily as needed for dizziness. (Patient not taking: Reported on 04/14/2023)   primidone (MYSOLINE) 50 MG tablet Take 1 tablet (50 mg total) by mouth 2 (two) times daily. (Patient not taking: Reported on 04/14/2023)   No facility-administered encounter medications on file as of 06/08/2023.    Allergies (verified) Hydrocodone and Iohexol   History: Past Medical History:  Diagnosis Date   Basal cell cancer    BPH (benign prostatic hypertrophy)    Dr Mena Goes   Chronic kidney disease    Hx of clear fluid sacs on both kidneys. Drained about 2006   Diverticulosis of colon    Elevated PSA    [facility]   History of kidney stones    Hx of colonic polyps    Hyperlipidemia    Hypertension    Melanoma (  [facility])    Past Surgical History:  Procedure Laterality Date   basal cell     R  & L temple; Dr Terri Piedra   COLONOSCOPY W/ POLYPECTOMY  1997, 2011   Dr Kinnie Scales   melanoma     L forehead   Right rotator cuff sx     TOTAL HIP ARTHROPLASTY Left 12/19/2014   Procedure: LEFT TOTAL HIP ARTHROPLASTY ANTERIOR APPROACH;  Surgeon: Kathryne Hitch, MD;  Location: [facility];  Service: Orthopedics;  Laterality: Left;   Family History  Problem Relation Age of Onset   Colon cancer Brother 76   Other Sister        addisons disease   Dementia Sister    Stroke Brother    Stroke Mother 74   Heart  attack Father 80   Stroke Father    Stroke Paternal Grandfather        in 10s   Diabetes Son        IDDM   Crohn's disease Daughter    Drug abuse Son    Social History   Socioeconomic History   Marital status: Married    Spouse name: Claris Gower   Number of children: 3   Years of education: Not on file   Highest education level: Professional school degree (e.g., MD, DDS, DVM, JD)  Occupational History   Occupation: retired    Comment: Occupational hygienist  Tobacco Use   Smoking status: Never   Smokeless tobacco: Never  Vaping Use   Vaping status: Never Used  Substance and Sexual Activity   Alcohol use: No    Alcohol/week: 0.0 standard drinks of alcohol   Drug use: No   Sexual activity: Not on file  Other Topics Concern   Not on file  Social History Narrative   Walks twice daily for exercise   Pt lives at home with spouse 1 story home- he has 3 children 2 which are living   Pt can use both hands to write   Social Determinants of Health   Financial Resource Strain: Low Risk  (06/08/2023)   Overall Financial Resource Strain (CARDIA)    Difficulty of Paying Living Expenses: Not hard at all  Food Insecurity: No Food Insecurity (06/08/2023)   Hunger Vital Sign    Worried About Running Out of Food in the Last Year: Never true    Ran Out of Food in the Last Year: Never true  Transportation Needs: No Transportation Needs (06/08/2023)   PRAPARE - Administrator, Civil Service (Medical): No    Lack of Transportation (Non-Medical): No  Physical Activity: Insufficiently Active (06/08/2023)   Exercise Vital Sign    Days of Exercise per Week: 7 days    Minutes of Exercise per Session: 10 min  Stress: No Stress Concern Present (06/08/2023)   [employer] of Occupational Health - Occupational Stress Questionnaire    Feeling of Stress : Not at all  Social Connections: Moderately Isolated (06/08/2023)   Social Connection and Isolation Panel [NHANES]    Frequency of  Communication with Friends and Family: More than three times a week    Frequency of Social Gatherings with Friends and Family: Once a week    Attends Religious Services: Never    Database administrator or Organizations: No    Attends Banker Meetings: Never    Marital Status: Married    Tobacco Counseling Counseling given: Not Answered   Clinical Intake:  Pre-visit preparation completed: Yes  Pain : No/denies  pain     BMI - recorded: 20.98 Nutritional Status: BMI of 19-24  Normal Diabetes: No  How often do you need to have someone help you when you read instructions, pamphlets, or other written materials from your doctor or pharmacy?: 1 - Never  Interpreter Needed?: No  Information entered by :: Jeremie Giangrande, RMA   Activities of Daily Living    06/08/2023    2:24 PM  In your present state of health, do you have any difficulty performing the following activities:  Hearing? 1  Vision? 0  Difficulty concentrating or making decisions? 0  Walking or climbing stairs? 0  Dressing or bathing? 0  Doing errands, shopping? 0  Preparing Food and eating ? N  Using the Toilet? N  In the past six months, have you accidently leaked urine? N  Do you have problems with loss of bowel control? N  Managing your Medications? N  Managing your Finances? N  Housekeeping or managing your Housekeeping? N    Patient Care Team: Pincus Sanes, MD as PCP - General (Internal Medicine) Szabat, Vinnie Level, [facility] (Inactive) as Pharmacist (Pharmacist) Tat, Octaviano Batty, DO as Consulting Physician (Neurology) Maris Berger, MD as Consulting Physician (Ophthalmology)  Indicate any recent Medical Services you may have received from other than [facility] providers in the past year (date may be approximate).     Assessment:   This is a routine wellness examination for Lam[place]a.  Hearing/Vision screen Hearing Screening - Comments:: Wears hearing aides Vision Screening - Comments:: Wears  eyeglasses   Goals Addressed             This Visit's Progress    MY GOAL IS TO CONTINUE BEING PHYSICALLY ACTIVE BY WALKING.   On track     Depression Screen    06/08/2023    2:44 PM 03/21/2023   10:33 AM 01/11/2023    1:01 PM 11/02/2022    3:13 PM 07/14/2022   10:40 AM 05/16/2022    3:43 PM 04/13/2022    1:58 PM  PHQ 2/9 Scores  PHQ - 2 Score 0 0 0 0 0 0 0  PHQ- 9 Score 0 0 0        Fall Risk    06/08/2023    2:40 PM 03/21/2023   10:33 AM 01/11/2023    1:01 PM 11/02/2022    3:13 PM 07/14/2022   10:40 AM  Fall Risk   Falls in the past year? 1 0 0 0 1  Number falls in past yr: 0 0 0 0 0  Injury with Fall? 0 0 0 0 0  Risk for fall due to :   No Fall Risks No Fall Risks No Fall Risks  Follow up Falls evaluation completed;Falls prevention discussed Falls evaluation completed Falls evaluation completed Falls evaluation completed Falls evaluation completed    MEDICARE RISK AT HOME: Medicare Risk at Home Any stairs in or around the home?: No Home free of loose throw rugs in walkways, pet beds, electrical cords, etc?: Yes Adequate lighting in your home to reduce risk of falls?: Yes Life alert?: No Use of a cane, walker or w/c?: No Grab bars in the bathroom?: No Shower chair or bench in shower?: No Elevated toilet seat or a handicapped toilet?: No  TIMED UP AND GO:  Was the test performed?  No    Cognitive Function:        06/08/2023    2:41 PM 05/16/2022    3:47 PM  6CIT Screen  What Year? 0 points 0 points  What month? 0 points 0 points  What time? 0 points 0 points  Count back from 20 0 points 0 points  Months in reverse 0 points 0 points  Repeat phrase 0 points 0 points  Total Score 0 points 0 points    Immunizations Immunization History  Administered Date(s) Administered   Fluad Quad(high Dose 65+) 09/10/2020, 06/18/2021   Influenza Whole 06/02/2008   Influenza, High Dose Seasonal PF 08/01/2017, 06/13/2022   PFIZER Comirnaty(Gray Top)Covid-19  Tri-Sucrose Vaccine 06/13/2022   PFIZER(Purple Top)SARS-COV-2 Vaccination 09/20/2019, 10/11/2019, 03/15/2020, 09/15/2020   Pfizer(Comirnaty)Fall Seasonal Vaccine 12 years and older 06/13/2022   Pneumococcal Conjugate-13 07/29/2015   Pneumococcal Polysaccharide-23 07/29/2016   Tdap 01/14/2021    TDAP status: Up to date  Flu Vaccine status: Due, Education has been provided regarding the importance of this vaccine. Advised may receive this vaccine at local pharmacy or Health Dept. Aware to provide a copy of the vaccination record if obtained from local pharmacy or Health Dept. Verbalized acceptance and understanding.  Pneumococcal vaccine status: Up to date  Covid-19 vaccine status: Information provided on how to obtain vaccines.   Qualifies for Shingles Vaccine? Yes   Zostavax completed No   Shingrix Completed?: No.    Education has been provided regarding the importance of this vaccine. Patient has been advised to call insurance company to determine out of pocket expense if they have not yet received this vaccine. Advised may also receive vaccine at local pharmacy or Health Dept. Verbalized acceptance and understanding.  Screening Tests Health Maintenance  Topic Date Due   Zoster Vaccines- Shingrix (1 of 2) Never done   INFLUENZA VACCINE  03/16/2023   COVID-19 Vaccine (5 - 2023-24 season) 04/16/2023   Medicare Annual Wellness (AWV)  06/07/2024   DTaP/Tdap/Td (2 - Td or Tdap) 01/15/2031   Pneumonia Vaccine 41+ Years old  Completed   HPV VACCINES  Aged Out    Health Maintenance  Health Maintenance Due  Topic Date Due   Zoster Vaccines- Shingrix (1 of 2) Never done   INFLUENZA VACCINE  03/16/2023   COVID-19 Vaccine (5 - 2023-24 season) 04/16/2023    Colorectal cancer screening: No longer required.   Lung Cancer Screening: (Low Dose CT Chest recommended if Age 62-80 years, 20 pack-year currently smoking OR have quit w/in 15years.) does not qualify.   Lung Cancer Screening  Referral: N/C  Additional Screening:  Hepatitis C Screening: does not qualify;   Vision Screening: Recommended annual ophthalmology exams for early detection of glaucoma and other disorders of the eye. Is the patient up to date with their annual eye exam?  No  Who is the provider or what is the name of the office in which the patient attends annual eye exams? Dr. Charlotte Sanes If pt is not established with a provider, would they like to be referred to a provider to establish care? No .   Dental Screening: Recommended annual dental exams for proper oral hygiene   Community Resource Referral / Chronic Care Management: CRR required this visit?  No   CCM required this visit?  No     Plan:     I have personally reviewed and noted the following in the patient's chart:   Medical and social history Use of alcohol, tobacco or illicit drugs  Current medications and supplements including opioid prescriptions. Patient is not currently taking opioid prescriptions. Functional ability and status Nutritional status Physical activity Advanced directives List of other physicians Hospitalizations, surgeries,  and ER visits in previous 12 months Vitals Screenings to include cognitive, depression, and falls Referrals and appointments  In addition, I have reviewed and discussed with patient certain preventive protocols, quality metrics, and best practice recommendations. A written personalized care plan for preventive services as well as general preventive health recommendations were provided to patient.     Casy Brunetto L Evrett Hakim, CMA   06/08/2023   After Visit Summary: (Mail) Due to this being a telephonic visit, the after visit summary with patients personalized plan was offered to patient via mail   Nurse Notes: Patient is due for a Flu and Covid vaccine and will get them both soon.  He had no other concerns to address today.

## 2023-06-12 ENCOUNTER — Other Ambulatory Visit: Payer: Self-pay | Admitting: Internal Medicine

## 2023-06-16 ENCOUNTER — Ambulatory Visit: Payer: Medicare Other | Admitting: Gastroenterology

## 2023-06-16 ENCOUNTER — Encounter: Payer: Self-pay | Admitting: Gastroenterology

## 2023-06-16 VITALS — BP 95/56 | HR 51 | Temp 97.3°F | Resp 11 | Ht 68.0 in | Wt 138.0 lb

## 2023-06-16 DIAGNOSIS — R1310 Dysphagia, unspecified: Secondary | ICD-10-CM

## 2023-06-16 DIAGNOSIS — K222 Esophageal obstruction: Secondary | ICD-10-CM

## 2023-06-16 DIAGNOSIS — R1312 Dysphagia, oropharyngeal phase: Secondary | ICD-10-CM

## 2023-06-16 DIAGNOSIS — I10 Essential (primary) hypertension: Secondary | ICD-10-CM | POA: Diagnosis not present

## 2023-06-16 DIAGNOSIS — K3189 Other diseases of stomach and duodenum: Secondary | ICD-10-CM | POA: Diagnosis not present

## 2023-06-16 DIAGNOSIS — E785 Hyperlipidemia, unspecified: Secondary | ICD-10-CM | POA: Diagnosis not present

## 2023-06-16 MED ORDER — SODIUM CHLORIDE 0.9 % IV SOLN: 500.0000 mL | Freq: Once | INTRAVENOUS | Status: DC

## 2023-06-16 NOTE — Progress Notes (Signed)
Pt's states no medical or surgical changes since previsit or office visit. 

## 2023-06-16 NOTE — Progress Notes (Signed)
[facility] History and Physical   Primary Care Physician:  Pincus Sanes, MD   Reason for Procedure:   Dysphagia  Plan:    EGD with dilation     HPI: Roger Ingram is a 89 y.o. male  here for EGD to evaluate dysphagia - history of barium study and modified barium swallow - overall concerning for possible prominent cricopharyngeal bar and mild pharyngeal stricture, perhaps related to his symptoms. Offered EGD with empiric dilation to see if this will help.    Otherwise feels well without any cardiopulmonary symptoms.   I have discussed risks / benefits of anesthesia and endoscopic procedure with Remi Deter and they wish to proceed with the exams as outlined today.    Past Medical History:  Diagnosis Date   Basal cell cancer    BPH (benign prostatic hypertrophy)    Dr Mena Goes   Chronic kidney disease    Hx of clear fluid sacs on both kidneys. Drained about 2006   Diverticulosis of colon    Elevated PSA    [facility]   History of kidney stones    Hx of colonic polyps    Hyperlipidemia    Hypertension    Melanoma ([facility])     Past Surgical History:  Procedure Laterality Date   basal cell     R  & L temple; Dr Terri Piedra   COLONOSCOPY W/ POLYPECTOMY  1997, 2011   Dr Kinnie Scales   melanoma     L forehead   Right rotator cuff sx     TOTAL HIP ARTHROPLASTY Left 12/19/2014   Procedure: LEFT TOTAL HIP ARTHROPLASTY ANTERIOR APPROACH;  Surgeon: Kathryne Hitch, MD;  Location: [facility];  Service: Orthopedics;  Laterality: Left;    Prior to Admission medications   Medication Sig Start Date End Date Taking? Authorizing Provider  amLODipine (NORVASC) 10 MG tablet Take 1 tablet (10 mg total) by mouth daily. 09/13/21  Yes Burns, Bobette Mo, MD  aspirin EC 81 MG tablet Take 81 mg by mouth daily.   Yes [provider]  cholecalciferol (VITAMIN D3) 25 MCG (1000 UNIT) tablet Take 1,000 Units by mouth daily.   Yes [provider]  finasteride (PROSCAR) 5  MG tablet TAKE 1 TABLET BY MOUTH ONCE DAILY . APPOINTMENT REQUIRED FOR FUTURE REFILLS 03/20/23  Yes Burns, Bobette Mo, MD  folic acid (FOLVITE) 400 MCG tablet Take 400 mcg by mouth daily.   Yes [provider]  propranolol (INDERAL) 10 MG tablet TAKE 1 TABLET BY MOUTH TWICE DAILY FOR  ESSENTIAL  TREMOR 06/12/23  Yes Burns, Bobette Mo, MD  tamsulosin (FLOMAX) 0.4 MG CAPS capsule Take 1 capsule by mouth once daily 04/06/23  Yes Burns, Bobette Mo, MD  meclizine (ANTIVERT) 12.5 MG tablet Take 1-2 tablets (12.5-25 mg total) by mouth 3 (three) times daily as needed for dizziness. Patient not taking: Reported on 04/14/2023 05/13/21   Elenore Paddy, NP  primidone (MYSOLINE) 50 MG tablet Take 1 tablet (50 mg total) by mouth 2 (two) times daily. Patient not taking: Reported on 04/14/2023 12/08/21   Tat, Octaviano Batty, DO    Current Outpatient Medications  Medication Sig Dispense Refill   amLODipine (NORVASC) 10 MG tablet Take 1 tablet (10 mg total) by mouth daily. 90 tablet    aspirin EC 81 MG tablet Take 81 mg by mouth daily.     cholecalciferol (VITAMIN D3) 25 MCG (1000 UNIT) tablet Take 1,000 Units by mouth daily.  finasteride (PROSCAR) 5 MG tablet TAKE 1 TABLET BY MOUTH ONCE DAILY . APPOINTMENT REQUIRED FOR FUTURE REFILLS 90 tablet 0   folic acid (FOLVITE) 400 MCG tablet Take 400 mcg by mouth daily.     propranolol (INDERAL) 10 MG tablet TAKE 1 TABLET BY MOUTH TWICE DAILY FOR  ESSENTIAL  TREMOR 180 tablet 0   tamsulosin (FLOMAX) 0.4 MG CAPS capsule Take 1 capsule by mouth once daily 90 capsule 1   meclizine (ANTIVERT) 12.5 MG tablet Take 1-2 tablets (12.5-25 mg total) by mouth 3 (three) times daily as needed for dizziness. (Patient not taking: Reported on 04/14/2023) 40 tablet 1   primidone (MYSOLINE) 50 MG tablet Take 1 tablet (50 mg total) by mouth 2 (two) times daily. (Patient not taking: Reported on 04/14/2023) 180 tablet 1   Current Facility-Administered Medications  Medication Dose Route Frequency  Provider Last Rate Last Admin   0.9 %  sodium chloride infusion  500 mL Intravenous Once Jemarion Roycroft, Willaim Rayas, MD        Allergies as of 06/16/2023 - Review Complete 06/16/2023  Allergen Reaction Noted   Hydrocodone Other (See Comments)    Iohexol Other (See Comments) 07/27/2004    Family History  Problem Relation Age of Onset   Colon cancer Brother 79   Other Sister        addisons disease   Dementia Sister    Stroke Brother    Stroke Mother 80   Heart attack Father 61   Stroke Father    Stroke Paternal Grandfather        in 3s   Diabetes Son        IDDM   Crohn's disease Daughter    Drug abuse Son     Social History   Socioeconomic History   Marital status: Married    Spouse name: Claris Gower   Number of children: 3   Years of education: Not on file   Highest education level: Professional school degree (e.g., MD, DDS, DVM, JD)  Occupational History   Occupation: retired    Comment: Occupational hygienist  Tobacco Use   Smoking status: Never   Smokeless tobacco: Never  Vaping Use   Vaping status: Never Used  Substance and Sexual Activity   Alcohol use: No    Alcohol/week: 0.0 standard drinks of alcohol   Drug use: No   Sexual activity: Not on file  Other Topics Concern   Not on file  Social History Narrative   Walks twice daily for exercise   Pt lives at home with spouse 1 story home- he has 3 children 2 which are living   Pt can use both hands to write   Social Determinants of Health   Financial Resource Strain: Low Risk  (06/08/2023)   Overall Financial Resource Strain (CARDIA)    Difficulty of Paying Living Expenses: Not hard at all  Food Insecurity: No Food Insecurity (06/08/2023)   Hunger Vital Sign    Worried About Running Out of Food in the Last Year: Never true    Ran Out of Food in the Last Year: Never true  Transportation Needs: No Transportation Needs (06/08/2023)   PRAPARE - Administrator, Civil Service (Medical): No    Lack of Transportation  (Non-Medical): No  Physical Activity: Insufficiently Active (06/08/2023)   Exercise Vital Sign    Days of Exercise per Week: 7 days    Minutes of Exercise per Session: 10 min  Stress: No Stress Concern Present (06/08/2023)  [employer] of Occupational Health - Occupational Stress Questionnaire    Feeling of Stress : Not at all  Social Connections: Moderately Isolated (06/08/2023)   Social Connection and Isolation Panel [NHANES]    Frequency of Communication with Friends and Family: More than three times a week    Frequency of Social Gatherings with Friends and Family: Once a week    Attends Religious Services: Never    Database administrator or Organizations: No    Attends Banker Meetings: Never    Marital Status: Married  Catering manager Violence: Not At Risk (06/08/2023)   Humiliation, Afraid, Rape, and Kick questionnaire    Fear of Current or Ex-Partner: No    Emotionally Abused: No    Physically Abused: No    Sexually Abused: No    Review of Systems: All other review of systems negative except as mentioned in the HPI.  Physical Exam: Vital signs BP (!) 142/60   Pulse (!) 57   Temp (!) 97.3 F (36.3 C) (Skin)   Ht 5\' 8"  (1.727 m)   Wt 138 lb (62.6 kg)   SpO2 98%   BMI 20.98 kg/m   General:   Alert,  Well-developed, pleasant and cooperative in NAD Lungs:  Clear throughout to auscultation.   Heart:  Regular rate and rhythm Abdomen:  Soft, nontender and nondistended.   Neuro/Psych:  Alert and cooperative. Normal mood and affect. A and O x 3  Harlin Rain, MD [facility]

## 2023-06-16 NOTE — Progress Notes (Signed)
Sedate, gd SR's, VSS, report to RN 

## 2023-06-16 NOTE — Op Note (Signed)
[facility] Patient Name: Roger Ingram Procedure Date: 06/16/2023 12:07 PM MRN: [id number] Endoscopist: Viviann Spare P. Adela Lank , MD, [id number] Age: 89 Referring MD:  Date of Birth: [birth year] Gender: Male Account #: [id number] Procedure:                Upper GI endoscopy Indications:              Dysphagia - prior barium swallow did not show clear                            cause. Modified barium swallow suggested prominent                            cricopharyngeal bar which may be cause of symptoms.                            EGD with dilation to further evaluate and treat Medicines:                Monitored Anesthesia Care Procedure:                Pre-Anesthesia Assessment:                           - Prior to the procedure, a History and Physical                            was performed, and patient medications and                            allergies were reviewed. The patient's tolerance of                            previous anesthesia was also reviewed. The risks                            and benefits of the procedure and the sedation                            options and risks were discussed with the patient.                            All questions were answered, and informed consent                            was obtained. Prior Anticoagulants: The patient has                            taken no anticoagulant or antiplatelet agents. ASA                            Grade Assessment: III - A patient with severe                            systemic disease. After reviewing the risks and  benefits, the patient was deemed in satisfactory                            condition to undergo the procedure.                           After obtaining informed consent, the endoscope was                            passed under direct vision. Throughout the                            procedure, the patient's blood pressure, pulse, and                             oxygen saturations were monitored continuously. The                            Olympus scope [id number] was introduced through the                            mouth, and advanced to the second part of duodenum.                            The upper GI endoscopy was accomplished without                            difficulty. The patient tolerated the procedure                            well. Scope In: Scope Out: Findings:                 Esophagogastric landmarks were identified: the                            Z-line was found at 37 cm, the gastroesophageal                            junction was found at 37 cm and the upper extent of                            the gastric folds was found at 40 cm from the                            incisors.                           A 3 cm hiatal hernia was present.                           One benign-appearing, intrinsic mild stenosis was                            found 37 cm from the incisors,  at [facility] / entrance to                            hernia sac.                           The exam of the esophagus was otherwise normal.                           A guidewire was placed and the scope was withdrawn.                            Dilation was performed in the entire esophagus with                            a Savary dilator with mild resistance at 17 mm.                            Relook endoscopy showed appropriate dilation effect                            at the GEJ, while no mucosal disruption was noted                            in the proximal esophagus.                           The entire examined stomach was normal.                           Nodular mucosa was found in the duodenal bulb.                            Suspect benign ectopic gastric mucosa but biopsies                            were taken with a cold forceps for histology to                            rule out adenomatous changes.                           The exam of the  duodenum was otherwise normal. Complications:            No immediate complications. Estimated blood loss:                            Minimal. Estimated Blood Loss:     Estimated blood loss was minimal. Impression:               - Esophagogastric landmarks identified.                           - 3 cm hiatal hernia.                           -  Benign-appearing esophageal stenosis at the GEJ.                           - Normal esophagus otherwise                           - Savary dilation performed to 17mm                           - Normal stomach.                           - Nodular mucosa in the duodenal bulb, suspect                            benign ectopic gastric mucosa. Biopsied.                           - Normal duodenum otherwise. Recommendation:           - Patient has a contact number available for                            emergencies. The signs and symptoms of potential                            delayed complications were discussed with the                            patient. Return to normal activities tomorrow.                            Written discharge instructions were provided to the                            patient.                           - Post dilation diet.                           - Continue present medications.                           - Await pathology results. Viviann Spare P. Miguelina Fore, MD 06/16/2023 12:46:51 PM This report has been signed electronically.

## 2023-06-16 NOTE — Patient Instructions (Signed)
Educational handout provided to patient related to procedure.  Follow DILATION DIET- clear liquids for 1 hour and then soft diet for the next 24 hours.  Resume current medications  Awaiting pathology   YOU HAD AN ENDOSCOPIC PROCEDURE TODAY AT THE  ENDOSCOPY CENTER:   Refer to the procedure report that was given to you for any specific questions about what was found during the examination.  If the procedure report does not answer your questions, please call your gastroenterologist to clarify.  If you requested that your care partner not be given the details of your procedure findings, then the procedure report has been included in a sealed envelope for you to review at your convenience later.  YOU SHOULD EXPECT: Some feelings of bloating in the abdomen. Passage of more gas than usual.  Walking can help get rid of the air that was put into your GI tract during the procedure and reduce the bloating. If you had a lower endoscopy (such as a colonoscopy or flexible sigmoidoscopy) you may notice spotting of blood in your stool or on the toilet paper. If you underwent a bowel prep for your procedure, you may not have a normal bowel movement for a few days.  Please Note:  You might notice some irritation and congestion in your nose or some drainage.  This is from the oxygen used during your procedure.  There is no need for concern and it should clear up in a day or so.  SYMPTOMS TO REPORT IMMEDIATELY:  Following upper endoscopy (EGD)  Vomiting of blood or coffee ground material  New chest pain or pain under the shoulder blades  Painful or persistently difficult swallowing  New shortness of breath  Fever of 100F or higher  Black, tarry-looking stools  For urgent or emergent issues, a gastroenterologist can be reached at any hour by calling [phone]. Do not use MyChart messaging for urgent concerns.    DIET:  We do recommend a small meal at first, but then you may proceed to your  regular diet.  Drink plenty of fluids but you should avoid alcoholic beverages for 24 hours.  ACTIVITY:  You should plan to take it easy for the rest of today and you should NOT DRIVE or use heavy machinery until tomorrow (because of the sedation medicines used during the test).    FOLLOW UP: Our staff will call the number listed on your records the next business day following your procedure.  We will call around 7:15- 8:00 am to check on you and address any questions or concerns that you may have regarding the information given to you following your procedure. If we do not reach you, we will leave a message.     If any biopsies were taken you will be contacted by phone or by letter within the next 1-3 weeks.  Please call us at [phone] if you have not heard about the biopsies in 3 weeks.    SIGNATURES/CONFIDENTIALITY: You and/or your care partner have signed paperwork which will be entered into your electronic medical record.  These signatures attest to the fact that that the information above on your After Visit Summary has been reviewed and is understood.  Full responsibility of the confidentiality of this discharge information lies with you and/or your care-partner.

## 2023-06-16 NOTE — Progress Notes (Signed)
Called to room to assist during endoscopic procedure.  Patient ID and intended procedure confirmed with present staff. Received instructions for my participation in the procedure from the performing physician.  

## 2023-06-19 ENCOUNTER — Telehealth: Payer: Self-pay | Admitting: *Deleted

## 2023-06-19 NOTE — Telephone Encounter (Signed)
  Follow up Call-     06/16/2023   10:58 AM  Call back number  Post procedure Call Back phone  # [phone]  Permission to leave phone message Yes     Patient questions:  Do you have a fever, pain , or abdominal swelling? Yes.   Pain Score  0 *  Have you tolerated food without any problems? Yes.    Have you been able to return to your normal activities? Yes.    Do you have any questions about your discharge instructions: Diet   No. Medications  No. Follow up visit  No.  Do you have questions or concerns about your Care? No.  Actions: * If pain score is 4 or above: No action needed, pain <4.

## 2023-06-20 LAB — SURGICAL PATHOLOGY

## 2023-06-22 ENCOUNTER — Encounter: Payer: Self-pay | Admitting: Gastroenterology

## 2023-06-29 ENCOUNTER — Telehealth: Payer: Self-pay | Admitting: Gastroenterology

## 2023-06-29 NOTE — Telephone Encounter (Signed)
Happy to hear that. Thanks for letting me know

## 2023-06-29 NOTE — Telephone Encounter (Signed)
PT called to let us know that he received his procedure results. He's very happy to be feeling better and wants Dr. Adela Lank to know that he is exceptional and very pleased at his work.

## 2023-07-15 NOTE — Progress Notes (Unsigned)
Subjective:    Patient ID: Roger Ingram, male    DOB: [birth year], 89 y.o.   MRN: [id number]     HPI Roger Ingram is here for follow up of his chronic medical problems.  He did have his esophagus stretched but he has not seen much difference.  He has more difficulty forming words and talking the same speed he used to.    Eating is still a problem.    Medications and allergies reviewed with patient and updated if appropriate.  Current Outpatient Medications on File Prior to Visit  Medication Sig Dispense Refill   amLODipine (NORVASC) 10 MG tablet Take 1 tablet (10 mg total) by mouth daily. 90 tablet    aspirin EC 81 MG tablet Take 81 mg by mouth daily.     cholecalciferol (VITAMIN D3) 25 MCG (1000 UNIT) tablet Take 1,000 Units by mouth daily.     finasteride (PROSCAR) 5 MG tablet TAKE 1 TABLET BY MOUTH ONCE DAILY . APPOINTMENT REQUIRED FOR FUTURE REFILLS 90 tablet 0   folic acid (FOLVITE) 400 MCG tablet Take 400 mcg by mouth daily.     propranolol (INDERAL) 10 MG tablet TAKE 1 TABLET BY MOUTH TWICE DAILY FOR  ESSENTIAL  TREMOR 180 tablet 0   tamsulosin (FLOMAX) 0.4 MG CAPS capsule Take 1 capsule by mouth once daily 90 capsule 1   meclizine (ANTIVERT) 12.5 MG tablet Take 1-2 tablets (12.5-25 mg total) by mouth 3 (three) times daily as needed for dizziness. (Patient not taking: Reported on 04/14/2023) 40 tablet 1   No current facility-administered medications on file prior to visit.     Review of Systems  Constitutional:  Negative for fever.  Respiratory:  Positive for cough (with swallowing) and shortness of breath (a little at times). Negative for wheezing.   Cardiovascular:  Negative for chest pain, palpitations and leg swelling.  Gastrointestinal:        No gerd  Musculoskeletal:  Positive for joint swelling (left ankle from previous fx).  Neurological:  Positive for headaches (left posterior neck - head). Negative for light-headedness.       Objective:   Vitals:    07/17/23 1301 07/17/23 1347  BP: (!) 140/70 132/76  Pulse: 70   Temp: 98 F (36.7 C)   SpO2: 95%    BP Readings from Last 3 Encounters:  07/17/23 132/76  06/16/23 (!) 95/56  06/08/23 134/61   Wt Readings from Last 3 Encounters:  07/17/23 138 lb (62.6 kg)  06/16/23 138 lb (62.6 kg)  06/08/23 138 lb (62.6 kg)   Body mass index is 20.98 kg/m.    Physical Exam Constitutional:      General: He is not in acute distress.    Appearance: Normal appearance. He is not ill-appearing.  HENT:     Head: Normocephalic and atraumatic.  Eyes:     Conjunctiva/sclera: Conjunctivae normal.  Cardiovascular:     Rate and Rhythm: Normal rate and regular rhythm.     Heart sounds: Normal heart sounds.  Pulmonary:     Effort: Pulmonary effort is normal. No respiratory distress.     Breath sounds: Normal breath sounds. No wheezing or rales.  Musculoskeletal:     Right lower leg: No edema.     Left lower leg: No edema.  Skin:    General: Skin is warm and dry.     Findings: No rash.  Neurological:     Mental Status: He is alert. Mental status is at baseline.  Psychiatric:        Mood and Affect: Mood normal.        Lab Results  Component Value Date   WBC 7.1 01/11/2023   HGB 13.7 01/11/2023   HCT 41.7 01/11/2023   PLT 223.0 01/11/2023   GLUCOSE 131 (H) 01/11/2023   CHOL 208 (H) 09/13/2021   TRIG 120.0 09/13/2021   HDL 67.20 09/13/2021   LDLDIRECT 115.6 03/05/2013   LDLCALC 117 (H) 09/13/2021   ALT 16 01/11/2023   AST 17 01/11/2023   NA 138 01/11/2023   K 4.8 01/11/2023   CL 103 01/11/2023   CREATININE 1.26 01/11/2023   BUN 29 (H) 01/11/2023   CO2 29 01/11/2023   TSH 4.49 09/13/2021   PSA 1.35 08/01/2016   INR 1.00 12/09/2014   HGBA1C 5.8 01/11/2023     Assessment & Plan:    See Problem List for Assessment and Plan of chronic medical problems.

## 2023-07-15 NOTE — Patient Instructions (Addendum)
     Flu immunization administered today.      Blood work was ordered.       Medications changes include :   None     Return in about 6 months (around 01/15/2024) for follow up.

## 2023-07-17 ENCOUNTER — Encounter: Payer: Self-pay | Admitting: Internal Medicine

## 2023-07-17 ENCOUNTER — Ambulatory Visit (INDEPENDENT_AMBULATORY_CARE_PROVIDER_SITE_OTHER): Payer: Medicare Other | Admitting: Internal Medicine

## 2023-07-17 VITALS — BP 132/76 | HR 70 | Temp 98.0°F | Ht 68.0 in | Wt 138.0 lb

## 2023-07-17 DIAGNOSIS — I10 Essential (primary) hypertension: Secondary | ICD-10-CM

## 2023-07-17 DIAGNOSIS — R7303 Prediabetes: Secondary | ICD-10-CM | POA: Diagnosis not present

## 2023-07-17 DIAGNOSIS — E7849 Other hyperlipidemia: Secondary | ICD-10-CM | POA: Diagnosis not present

## 2023-07-17 DIAGNOSIS — R350 Frequency of micturition: Secondary | ICD-10-CM | POA: Diagnosis not present

## 2023-07-17 DIAGNOSIS — R1319 Other dysphagia: Secondary | ICD-10-CM

## 2023-07-17 DIAGNOSIS — G250 Essential tremor: Secondary | ICD-10-CM

## 2023-07-17 DIAGNOSIS — N1831 Chronic kidney disease, stage 3a: Secondary | ICD-10-CM | POA: Diagnosis not present

## 2023-07-17 DIAGNOSIS — Z23 Encounter for immunization: Secondary | ICD-10-CM

## 2023-07-17 DIAGNOSIS — N401 Enlarged prostate with lower urinary tract symptoms: Secondary | ICD-10-CM

## 2023-07-17 LAB — CBC WITH DIFFERENTIAL/PLATELET
Basophils Absolute: 0.1 10*3/uL (ref 0.0–0.1)
Basophils Relative: 0.8 % (ref 0.0–3.0)
Eosinophils Absolute: 0.3 10*3/uL (ref 0.0–0.7)
Eosinophils Relative: 3.8 % (ref 0.0–5.0)
HCT: 44.4 % (ref 39.0–52.0)
Hemoglobin: 14.6 g/dL (ref 13.0–17.0)
Lymphocytes Relative: 36 % (ref 12.0–46.0)
Lymphs Abs: 2.6 10*3/uL (ref 0.7–4.0)
MCHC: 32.8 g/dL (ref 30.0–36.0)
MCV: 91.5 fL (ref 78.0–100.0)
Monocytes Absolute: 0.7 10*3/uL (ref 0.1–1.0)
Monocytes Relative: 9.8 % (ref 3.0–12.0)
Neutro Abs: 3.6 10*3/uL (ref 1.4–7.7)
Neutrophils Relative %: 49.6 % (ref 43.0–77.0)
Platelets: 214 10*3/uL (ref 150.0–400.0)
RBC: 4.85 Mil/uL (ref 4.22–5.81)
RDW: 13.1 % (ref 11.5–15.5)
WBC: 7.2 10*3/uL (ref 4.0–10.5)

## 2023-07-17 LAB — COMPREHENSIVE METABOLIC PANEL
ALT: 17 U/L (ref 0–53)
AST: 19 U/L (ref 0–37)
Albumin: 4.3 g/dL (ref 3.5–5.2)
Alkaline Phosphatase: 59 U/L (ref 39–117)
BUN: 30 mg/dL — ABNORMAL HIGH (ref 6–23)
CO2: 26 meq/L (ref 19–32)
Calcium: 9.6 mg/dL (ref 8.4–10.5)
Chloride: 103 meq/L (ref 96–112)
Creatinine, Ser: 1.23 mg/dL (ref 0.40–1.50)
GFR: 50.93 mL/min — ABNORMAL LOW (ref >60.00)
Glucose, Bld: 94 mg/dL (ref 70–99)
Potassium: 4.7 meq/L (ref 3.5–5.1)
Sodium: 136 meq/L (ref 135–145)
Total Bilirubin: 0.6 mg/dL (ref 0.2–1.2)
Total Protein: 7.3 g/dL (ref 6.0–8.3)

## 2023-07-17 LAB — LIPID PANEL
Cholesterol: 216 mg/dL — ABNORMAL HIGH (ref 0–200)
HDL: 69.8 mg/dL (ref >39.00)
LDL Cholesterol: 132 mg/dL — ABNORMAL HIGH (ref 0–99)
NonHDL: 145.99
Total CHOL/HDL Ratio: 3
Triglycerides: 69 mg/dL (ref 0.0–149.0)
VLDL: 13.8 mg/dL (ref 0.0–40.0)

## 2023-07-17 LAB — HEMOGLOBIN A1C: Hgb A1c MFr Bld: 6 % (ref 4.6–6.5)

## 2023-07-17 NOTE — Assessment & Plan Note (Signed)
Chronic Acceptable for age Check lipid panel Continue lifestyle control

## 2023-07-17 NOTE — Assessment & Plan Note (Signed)
Chronic Following with nephrology CMP 

## 2023-07-17 NOTE — Assessment & Plan Note (Addendum)
Chronic controlled Continue Proscar 5 mg daily, Flomax 0.4 mg daily

## 2023-07-17 NOTE — Assessment & Plan Note (Signed)
Chronic Did have his esophagus stretched by GI, but there was little to no improvement Still having significant difficulty eating and swallowing capsules Advised follow-up with GI-most likely he will do that but he may wait a while Discussed risk of aspiration pneumonia and symptoms of it

## 2023-07-17 NOTE — Assessment & Plan Note (Signed)
Chronic Blood pressure well controlled CBC, CMP Continue amlodipine 10 mg daily, propranolol 10 mg twice daily which is mostly for his tremor

## 2023-07-17 NOTE — Assessment & Plan Note (Signed)
Chronic Following with Dr. Arbutus Leas On propranolol 10 mg twice daily No longer taking primidone

## 2023-07-17 NOTE — Assessment & Plan Note (Signed)
Chronic Lab Results  Component Value Date   HGBA1C 5.8 01/11/2023   Check a1c Low sugar / carb diet Stressed regular exercise

## 2023-07-19 ENCOUNTER — Telehealth: Payer: Self-pay | Admitting: Gastroenterology

## 2023-07-19 NOTE — Telephone Encounter (Signed)
Inbound call from patient, would like to discuss another endoscopy with dilation. Patient states he has seen some improvement but would like to discuss what another dilation would do. Please advise.

## 2023-07-19 NOTE — Telephone Encounter (Signed)
I dilated him to 17mm, can only go slightly larger than that in general. I think repeat exam would be unlikely to provide significant benefit over what he has already had done. He just had it done about a month ago. I think risks probably exceed benefits right now but if his swallowing is getting progressively worse over time we can certainly consider it again. Thanks

## 2023-07-19 NOTE — Telephone Encounter (Signed)
Dr Adela Lank-  Endoscopy with dilation was performed 06/16/23 for this patient. Would there be any benefit in dilating again at this time? Patient is calling to discuss a repeat procedure.

## 2023-07-19 NOTE — Telephone Encounter (Signed)
Discussed endoscopy with patient as well as Dr Lanetta Inch thoughts on repeat dilation at this time. Mr Roger Ingram states that initially, he felt benefit from the dilation, but after several days, began having some difficulty again. Notes that sometimes liquids "go down the wrong way" and "gets into my air passages." Describes large amounts of phlegm that he gets choked on and notes that it can become hard to breathe during these episodes. I did suggest that he follow with his primary care regarding the phlegm and dyspnea concerns. I did explain that should his swallowing continue to worsen over time, he is encouraged to call us at which time we can discuss the appropriateness of repeat endoscopy. Patient verbalizes understanding.   Of note: see Dr Lawerance Bach recent 07/17/23 office note for continued complaints.

## 2023-08-15 NOTE — Telephone Encounter (Signed)
 Spoke with patient who states that he continues to have dysphagia to solids and liquids, pretty much the same as before I had the endoscopy. States that previous dilation helped for a few days and he then began with dysphagia again. Patient states that he would like to see Dr Leigh in the office for additional advice and clarification on his condition as well as to talk about the possibility of repeating endoscopy.  Patient has been scheduled to see Dr Leigh on 09/01/23. He is reminded to eat easily chewable food, to chew each bite well, drink sips of liquids between bites. He is also advised that should be become unable to swallow secretions or keep food/liquid down, he should seek more emergent help at the emergency room.

## 2023-08-15 NOTE — Telephone Encounter (Signed)
PT has advised that his symptoms are worsening and would like to proceed with a repeat EGD. Please advise.

## 2023-08-21 ENCOUNTER — Other Ambulatory Visit: Payer: Self-pay | Admitting: Internal Medicine

## 2023-09-01 ENCOUNTER — Encounter: Payer: Self-pay | Admitting: Gastroenterology

## 2023-09-01 ENCOUNTER — Ambulatory Visit (INDEPENDENT_AMBULATORY_CARE_PROVIDER_SITE_OTHER): Payer: Medicare Other | Admitting: Gastroenterology

## 2023-09-01 VITALS — BP 120/70 | HR 73 | Ht 68.0 in | Wt 138.2 lb

## 2023-09-01 DIAGNOSIS — R499 Unspecified voice and resonance disorder: Secondary | ICD-10-CM

## 2023-09-01 DIAGNOSIS — R634 Abnormal weight loss: Secondary | ICD-10-CM | POA: Diagnosis not present

## 2023-09-01 DIAGNOSIS — R1310 Dysphagia, unspecified: Secondary | ICD-10-CM

## 2023-09-01 NOTE — Patient Instructions (Addendum)
We are referring you to ENT.  They will contact you directly to schedule an appointment.   [facility].) [address] [place] [employer] [place] [employer], [place] [address] [phone]  Thank you for entrusting me with your care and for choosing [facility], Dr. Ileene Patrick    If your blood pressure at your visit was 140/90 or greater, please contact your primary care physician to follow up on this. ______________________________________________________  If you are age 29 or older, your body mass index should be between 23-30. Your Body mass index is 21.02 kg/m. If this is out of the aforementioned range listed, please consider follow up with your Primary Care Provider.  If you are age 43 or younger, your body mass index should be between 19-25. Your Body mass index is 21.02 kg/m. If this is out of the aformentioned range listed, please consider follow up with your Primary Care Provider.  ________________________________________________________  The [facility] providers would like to encourage you to use [facility] to communicate with providers for non-urgent requests or questions.  Due to long hold times on the telephone, sending your provider a message by [facility] may be a faster and more efficient way to get a response.  Please allow 48 business hours for a response.  Please remember that this is for non-urgent requests.  _______________________________________________________  Due to recent changes in healthcare laws, you may see the results of your imaging and laboratory studies on MyChart before your provider has had a chance to review them.  We understand that in some cases there may be results that are confusing or concerning to you. Not all laboratory results come back in the same time frame and the provider may be waiting for multiple results in order to interpret others.  Please give [place] 48 hours in order for your provider to thoroughly review all the results  before contacting the office for clarification of your results.

## 2023-09-01 NOTE — Progress Notes (Signed)
HPI :  89 year old male here for follow-up visit for dysphagia.  Recall I saw him in September at which point in time he was describing dysphagia for the past 3 months to both solids and liquids.  He had never had an endoscopy at that point.  To workup the symptoms he underwent barium swallow, modified barium swallow, and upper endoscopy as outlined below:  Barium swallow 05/12/23: IMPRESSION: *Normal esophagram.   Modified barium swallow 05/17/23: Mr. Kellough was seen by SLP for modified barium swallow study per his complaints of violent coughing after drinking liquids and sensation of food getting stuck. Mr. Edmiston presents with an overall functional oropharyngeal swallow given his age. Penetration occurred with thin liquids, however, it was mostly shallow penetration of trace amounts that expelled from the airway. Mr. Martinka had great sensation and nearly always could identify when a bolus entered his airway. Sensed aspiration occurred once with a large bolus via straw. Aspirate expelled from the airway. Multiple swallows were necessary for puree and solid food consistencies. When initiating second swallow for solid food bolus, residual of previous swallow was expelled back into the oral cavity. upon a third swallow, laryngeal clearance was achieved. Hyolaryngeal excursion and epiglottic inversion were both complete and coordinated. Mr. Paddack deficits appeared to primarily be caused by a prominent cricopharyngeal bar along with questionable mild stricture. SLP recommended Mr. Nedd continue with his regular diet and thin liquids. He was advised to take small sips/bites and utilize extra sauce or gravy as necessary to assist in the clearance of solid foods. Medication may be taken with puree or pudding like consistencies. No further ST is necessary at this time.    EGD 06/16/23: - Esophagogastric landmarks were identified: the Z-line was found at 37 cm, the gastroesophageal junction was found  at 37 cm and the upper extent of the gastric folds was found at 40 cm from the incisors. Findings: - A 3 cm hiatal hernia was present. - One benign-appearing, intrinsic mild stenosis was found 37 cm from the incisors, at [facility] / entrance to hernia sac. - The exam of the esophagus was otherwise normal. - A guidewire was placed and the scope was withdrawn. Dilation was performed in the entire esophagus with a Savary dilator with mild resistance at 17 mm. Relook endoscopy showed appropriate dilation effect at the GEJ, while no mucosal disruption was noted in the proximal esophagus. - The entire examined stomach was normal. - Nodular mucosa was found in the duodenal bulb. Suspect benign ectopic gastric mucosa but biopsies were taken with a cold forceps for histology to rule out adenomatous changes. - The exam of the duodenum was otherwise normal.  FINAL DIAGNOSIS        1. Surgical [P], duodenal :       - GASTRIC OXYNTIC MUCOSA WITH FOCAL INTESTINAL-TYPE MUCOSA.  SEE NOTE.        Diagnosis Note : - The location of the biopsy labeled as duodenum is noted.  In       this context the findings represent a focus of gastric heterotopia with gastric       oxyntic-type mucosa adjacent to intestinal type mucosa.  Clinical and endoscopic       correlation is recommended.     As above I had hoped dilation of his esophagus would have helped his dysphagia.  He did get a dilation effect of his lower esophagus which was a bit surprising, I suspected the cricopharyngeal bar was more likely cause of his  symptoms.  He states the dilation barely helped and was short-lived.  After 1 week he was back to his typical baseline.  It sounds like his symptoms have been worsening since have last seen him.  He has a really difficult time eating much of anything, food needs to be chewed at great length and he continues to have bothersome dysphagia.  He also endorses some episodes of aspiration that are occurring from time to time when he  is eating or drinking. Further, his voice has changed since have seen him and he is altered from the last time of seeing him.  He states he has a hard time enunciating certain words.  He has not seen ENT.  He thinks he has lost 5 pounds over the past several months due to his decreased p.o. intake due to the symptoms.  He has seen ENT a few years ago for hearing issues but has never had a laryngoscopy.  We discussed his options      Colonoscopy 05/2010: 9mm rectosigmoid polyp, diverticulosis, hemorrhoids   Past Medical History:  Diagnosis Date   Basal cell cancer    BPH (benign prostatic hypertrophy)    Dr Mena Goes   Chronic kidney disease    Hx of clear fluid sacs on both kidneys. Drained about 2006   Diverticulosis of colon    Elevated PSA    [facility]   History of kidney stones    Hx of colonic polyps    Hyperlipidemia    Hypertension    Melanoma (HCC)      Past Surgical History:  Procedure Laterality Date   basal cell     R  & L temple; Dr Terri Piedra   COLONOSCOPY W/ POLYPECTOMY  1997, 2011   Dr Kinnie Scales   melanoma     L forehead   Right rotator cuff sx     TOTAL HIP ARTHROPLASTY Left 12/19/2014   Procedure: LEFT TOTAL HIP ARTHROPLASTY ANTERIOR APPROACH;  Surgeon: Kathryne Hitch, MD;  Location: [facility];  Service: Orthopedics;  Laterality: Left;   Family History  Problem Relation Age of Onset   Colon cancer Brother 100   Other Sister        addisons disease   Dementia Sister    Stroke Brother    Stroke Mother 61   Heart attack Father 31   Stroke Father    Stroke Paternal Grandfather        in 95s   Diabetes Son        IDDM   Crohn's disease Daughter    Drug abuse Son    Social History   Tobacco Use   Smoking status: Never   Smokeless tobacco: Never  Vaping Use   Vaping status: Never Used  Substance Use Topics   Alcohol use: No    Alcohol/week: 0.0 standard drinks of alcohol   Drug use: No   Current Outpatient Medications  Medication Sig  Dispense Refill   amLODipine (NORVASC) 10 MG tablet Take 1 tablet (10 mg total) by mouth daily. 90 tablet    aspirin EC 81 MG tablet Take 81 mg by mouth daily.     cholecalciferol (VITAMIN D3) 25 MCG (1000 UNIT) tablet Take 1,000 Units by mouth daily.     finasteride (PROSCAR) 5 MG tablet Take 1 tablet by mouth once daily 90 tablet 0   folic acid (FOLVITE) 400 MCG tablet Take 400 mcg by mouth daily.     propranolol (INDERAL) 10 MG tablet TAKE 1  TABLET BY MOUTH TWICE DAILY FOR  ESSENTIAL  TREMOR 180 tablet 0   tamsulosin (FLOMAX) 0.4 MG CAPS capsule Take 1 capsule by mouth once daily 90 capsule 1   meclizine (ANTIVERT) 12.5 MG tablet Take 1-2 tablets (12.5-25 mg total) by mouth 3 (three) times daily as needed for dizziness. (Patient not taking: Reported on 09/01/2023) 40 tablet 1   No current facility-administered medications for this visit.   Allergies  Allergen Reactions   Hydrocodone Other (See Comments)    Double vision / diplopia   Iohexol Other (See Comments)    Syncope post IV dye for renal calculi 1963     Review of Systems: All systems reviewed and negative except where noted in HPI.   Lab Results  Component Value Date   WBC 7.2 07/17/2023   HGB 14.6 07/17/2023   HCT 44.4 07/17/2023   MCV 91.5 07/17/2023   PLT 214.0 07/17/2023    Lab Results  Component Value Date   NA 136 07/17/2023   CL 103 07/17/2023   K 4.7 07/17/2023   CO2 26 07/17/2023   BUN 30 (H) 07/17/2023   CREATININE 1.23 07/17/2023   GFR 50.93 (L) 07/17/2023   CALCIUM 9.6 07/17/2023   ALBUMIN 4.3 07/17/2023   GLUCOSE 94 07/17/2023    Lab Results  Component Value Date   ALT 17 07/17/2023   AST 19 07/17/2023   ALKPHOS 59 07/17/2023   BILITOT 0.6 07/17/2023     Physical Exam: BP 120/70 (BP Location: Left Arm, Patient Position: Sitting, Cuff Size: Normal)   Pulse 73   Ht 5\' 8"  (1.727 m)   Wt 138 lb 4 oz (62.7 kg)   SpO2 98%   BMI 21.02 kg/m  Constitutional: Pleasant,well-developed, male in  no acute distress. HEENT: Normocephalic and atraumatic. Conjunctivae are normal. No scleral icterus. Neck supple. No mass lesions Neurological: Alert and oriented to person place and time. Psychiatric: Normal mood and affect. Behavior is normal.   ASSESSMENT: 89 y.o. male here for assessment of the following  1. Dysphagia, unspecified type   2. Change in voice   3. Loss of weight    Persistent dysphagia that has worsened in recent months.  He has had an extensive evaluation with a barium swallow that was entirely normal.  Modified barium swallow suggested a cricopharyngeal bar causing his symptoms.  No significant aspiration on that exam however clinically it sounds like he has had some mild aspiration. I proceeded with an EGD and dilated his entire esophagus to 17 mm.  This did cause dilation effect at the GEJ at the site of a subtle stricture.  I did not appreciate any obvious stricturing at the UES on EGD.  The dilation did not provide much benefit.  Unfortunately he is getting worse and now his voice has changed and he is losing some weight.  Again concern would be for cricopharyngeal bar causing this based on the modified barium swallow however with his voice change that would be atypical for solely that process.  I am recommending an urgent ENT evaluation for laryngoscopy and look in his posterior pharynx to make sure okay.  Will await that result initially.  If they cannot find anything to cause the symptoms can consider repeat EGD with larger dilation of the UES however unclear how much benefit if any he would get from that, he was already dilated to 17 mm.  PLAN: - urgent ENT evaluation - await findings - can consider repeat EGD with larger dilation pending  findings   Harlin Rain, MD [facility]

## 2023-09-14 DIAGNOSIS — N1831 Chronic kidney disease, stage 3a: Secondary | ICD-10-CM | POA: Diagnosis not present

## 2023-09-14 DIAGNOSIS — N400 Enlarged prostate without lower urinary tract symptoms: Secondary | ICD-10-CM | POA: Diagnosis not present

## 2023-09-14 DIAGNOSIS — N281 Cyst of kidney, acquired: Secondary | ICD-10-CM | POA: Diagnosis not present

## 2023-09-14 DIAGNOSIS — G250 Essential tremor: Secondary | ICD-10-CM | POA: Diagnosis not present

## 2023-09-14 DIAGNOSIS — I129 Hypertensive chronic kidney disease with stage 1 through stage 4 chronic kidney disease, or unspecified chronic kidney disease: Secondary | ICD-10-CM | POA: Diagnosis not present

## 2023-10-02 ENCOUNTER — Other Ambulatory Visit: Payer: Self-pay | Admitting: Internal Medicine

## 2023-10-02 ENCOUNTER — Encounter (INDEPENDENT_AMBULATORY_CARE_PROVIDER_SITE_OTHER): Payer: Self-pay | Admitting: Otolaryngology

## 2023-10-02 ENCOUNTER — Ambulatory Visit (INDEPENDENT_AMBULATORY_CARE_PROVIDER_SITE_OTHER): Payer: Medicare Other | Admitting: Otolaryngology

## 2023-10-02 VITALS — BP 154/64 | HR 74 | Ht 68.0 in | Wt 130.0 lb

## 2023-10-02 DIAGNOSIS — R471 Dysarthria and anarthria: Secondary | ICD-10-CM

## 2023-10-02 DIAGNOSIS — R634 Abnormal weight loss: Secondary | ICD-10-CM

## 2023-10-02 DIAGNOSIS — R1310 Dysphagia, unspecified: Secondary | ICD-10-CM

## 2023-10-02 DIAGNOSIS — R221 Localized swelling, mass and lump, neck: Secondary | ICD-10-CM | POA: Diagnosis not present

## 2023-10-02 DIAGNOSIS — R490 Dysphonia: Secondary | ICD-10-CM

## 2023-10-02 NOTE — Progress Notes (Unsigned)
ENT CONSULT:  Reason for Consult: dysphagia, weight loss, coughing with liquids   HPI: Discussed the use of AI scribe software for clinical note transcription with the patient, who gave verbal consent to proceed.  History of Present Illness   Roger Ingram is a 89 year old male who presents with difficulty swallowing, dysarthria, weight loss and changes in his voice for approximately 1 year. He was referred by Dr. Adela Lank, GI.  He experiences significant difficulty swallowing, stating that he can't swallow anything and it takes him a long time to swallow and eat food in general. Chewing and swallowing is very effortful for him. He has lost weight and continues to lose weight, attributing this to his inability to eat as much or as quickly as before due to food getting stuck on its way down. His diet mainly consists of soft foods such as oatmeal, macaroni and cheese, and mashed potatoes, and he eats very little meat. He occasionally consumes a sausage muffin with egg. No history of strokes, but medical professionals have speculated about the possibility. Never seen Neurology. He has essential tremor hx, but denies any other neurologic conditions he was previously diagnosed with.   He reports a significant change in his voice, which was noted after an upper endoscopy with GI. He describes difficulty forming words quickly and states that his conversation is 'so much slower now.' He experiences a 'tremendous amount of thick phlegm,' which disrupts his sleep and breathing at night. He has not seen a neurologist for these symptoms. He feels his voice is somewhat fading most of the time, and his projection is very weak. His voice is raspy, and words sound garbled at times.   He mentions a lump on the left side of his neck under his jaw, which he believes might be related to his salivary gland, and reports that it is not present on the other side. No prior imaging or workup. Denies pain, facial muscle  weakness, denies lumps anywhere else.   He has a history of essential tremor and takes medication for it (Propranolol).  He mentions a past melanoma diagnosis on his left forehead, which was removed approximately eight years ago. He was warned at the time that he needs continued surveillance for disease recurrence. No records are available for review.     Records Reviewed:  GI office visit Dr Adela Lank 09/01/23 89 year old male here for follow-up visit for dysphagia.   Recall I saw him in September at which point in time he was describing dysphagia for the past 3 months to both solids and liquids.  He had never had an endoscopy at that point.   To workup the symptoms he underwent barium swallow, modified barium swallow, and upper endoscopy as outlined below:   Barium swallow 05/12/23: IMPRESSION: *Normal esophagram.     Modified barium swallow 05/17/23: Roger Ingram was seen by SLP for modified barium swallow study per his complaints of violent coughing after drinking liquids and sensation of food getting stuck. Roger Ingram presents with an overall functional oropharyngeal swallow given his age. Penetration occurred with thin liquids, however, it was mostly shallow penetration of trace amounts that expelled from the airway. Roger Ingram had great sensation and nearly always could identify when a bolus entered his airway. Sensed aspiration occurred once with a large bolus via straw. Aspirate expelled from the airway. Multiple swallows were necessary for puree and solid food consistencies. When initiating second swallow for solid food bolus, residual of previous swallow was expelled back  into the oral cavity. upon a third swallow, laryngeal clearance was achieved. Hyolaryngeal excursion and epiglottic inversion were both complete and coordinated. Roger Ingram deficits appeared to primarily be caused by a prominent cricopharyngeal bar along with questionable mild stricture. SLP recommended Roger Ingram  continue with his regular diet and thin liquids. He was advised to take small sips/bites and utilize extra sauce or gravy as necessary to assist in the clearance of solid foods. Medication may be taken with puree or pudding like consistencies. No further ST is necessary at this time.      EGD 06/16/23: - Esophagogastric landmarks were identified: the Z-line was found at 37 cm, the gastroesophageal junction was found at 37 cm and the upper extent of the gastric folds was found at 40 cm from the incisors. Findings: - A 3 cm hiatal hernia was present. - One benign-appearing, intrinsic mild stenosis was found 37 cm from the incisors, at [facility] / entrance to hernia sac. - The exam of the esophagus was otherwise normal. - A guidewire was placed and the scope was withdrawn. Dilation was performed in the entire esophagus with a Savary dilator with mild resistance at 17 mm. Relook endoscopy showed appropriate dilation effect at the GEJ, while no mucosal disruption was noted in the proximal esophagus. - The entire examined stomach was normal. - Nodular mucosa was found in the duodenal bulb. Suspect benign ectopic gastric mucosa but biopsies were taken with a cold forceps for histology to rule out adenomatous changes. - The exam of the duodenum was otherwise normal.  As above I had hoped dilation of his esophagus would have helped his dysphagia.  He did get a dilation effect of his lower esophagus which was a bit surprising, I suspected the cricopharyngeal bar was more likely cause of his symptoms.  He states the dilation barely helped and was short-lived.  After 1 week he was back to his typical baseline.  It sounds like his symptoms have been worsening since have last seen him.  He has a really difficult time eating much of anything, food needs to be chewed at great length and he continues to have bothersome dysphagia.  He also endorses some episodes of aspiration that are occurring from time to time when he is eating or  drinking. Further, his voice has changed since have seen him and he is altered from the last time of seeing him.  He states he has a hard time enunciating certain words.  He has not seen ENT.  He thinks he has lost 5 pounds over the past several months due to his decreased p.o. intake due to the symptoms.   He has seen ENT a few years ago for hearing issues but has never had a laryngoscopy.  We discussed his options        Past Medical History:  Diagnosis Date   Basal cell cancer    BPH (benign prostatic hypertrophy)    Dr Mena Goes   Chronic kidney disease    Hx of clear fluid sacs on both kidneys. Drained about 2006   Diverticulosis of colon    Elevated PSA    [facility]   History of kidney stones    Hx of colonic polyps    Hyperlipidemia    Hypertension    Melanoma [facility])     Past Surgical History:  Procedure Laterality Date   basal cell     R  & L temple; Dr Terri Piedra   COLONOSCOPY W/ POLYPECTOMY  1997, 2011  Dr Kinnie Scales   melanoma     L forehead   Right rotator cuff sx     TOTAL HIP ARTHROPLASTY Left 12/19/2014   Procedure: LEFT TOTAL HIP ARTHROPLASTY ANTERIOR APPROACH;  Surgeon: Kathryne Hitch, MD;  Location: [facility];  Service: Orthopedics;  Laterality: Left;    Family History  Problem Relation Age of Onset   Colon cancer Brother 28   Other Sister        addisons disease   Dementia Sister    Stroke Brother    Stroke Mother 35   Heart attack Father 68   Stroke Father    Stroke Paternal Grandfather        in 54s   Diabetes Son        IDDM   Crohn's disease Daughter    Drug abuse Son     Social History:  reports that he has never smoked. He has never used smokeless tobacco. He reports that he does not drink alcohol and does not use drugs.  Allergies:  Allergies  Allergen Reactions   Hydrocodone Other (See Comments)    Double vision / diplopia   Iohexol Other (See Comments)    Syncope post IV dye for renal calculi 1963    Medications: I have  reviewed the patient's current medications.  The PMH, PSH, Medications, Allergies, and SH were reviewed and updated.  ROS: Constitutional: Negative for fever, weight loss and weight gain. Cardiovascular: Negative for chest pain and dyspnea on exertion. Respiratory: Is not experiencing shortness of breath at rest. Gastrointestinal: Negative for nausea and vomiting. Neurological: Negative for headaches. Psychiatric: The patient is not nervous/anxious  Blood pressure (!) 154/64, pulse 74, height 5\' 8"  (1.727 m), weight 130 lb (59 kg), SpO2 98%. Body mass index is 19.77 kg/m.  PHYSICAL EXAM:  Exam: General: Well-developed, thin Communication and Voice:raspy, wet quality, frequent throat clearing speech slightly garbled likely due to tongue weakness  Respiratory Respiratory effort: Equal inspiration and expiration without stridor Cardiovascular Peripheral Vascular: Warm extremities with equal color/perfusion Eyes: No nystagmus with equal extraocular motion bilaterally Neuro/Psych/Balance: Patient oriented to person, place, and time; Appropriate mood and affect; Gait is intact with no imbalance; Cranial nerves I-XII are intact Head and Face Inspection: Normocephalic and atraumatic without mass or lesion Palpation: Facial skeleton intact without bony stepoffs Salivary Glands: No mass or tenderness Facial Strength: Facial motility symmetric and full bilaterally ENT Pinna: External ear intact and fully developed External canal: Canal is patent with intact skin Tympanic Membrane: Clear and mobile External Nose: No scar or anatomic deformity Internal Nose: Septum is deviated to the left. No polyp, or purulence. Mucosal edema and erythema present.  Bilateral inferior turbinate hypertrophy.  Lips, Teeth, and gums: Mucosa and teeth intact and viable TMJ: No pain to palpation with full mobility Oral cavity/oropharynx: No erythema or exudate, no lesions present. Tongue weakness with ROM all  directions Nasopharynx: No mass or lesion with intact mucosa Hypopharynx: Intact mucosa without pooling of secretions Larynx Glottic: Full true vocal cord mobility without lesion or mass Severe VF atrophy b/l and glottic insufficiency Supraglottic: Normal appearing epiglottis and AE folds Interarytenoid Space: Moderate pachydermia&edema Subglottic Space: Patent without lesion or edema Neck Neck and Trachea: Midline trachea without mass or lesion Thyroid: No mass or nodularity Lymphatics: No lymphadenopathy  Procedure: Preoperative diagnosis: dysphonia, dysarthria, dysphagia   Postoperative diagnosis:   Same + VF atrophy glottic insufficiency + GERD LPR  Procedure: Flexible fiberoptic laryngoscopy  Surgeon: Ashok Croon, MD  Anesthesia:  Topical lidocaine and Afrin Complications: None Condition is stable throughout exam  Indications and consent:  The patient presents to the clinic with dysphonia.  Indirect laryngoscopy view was incomplete. Thus it was recommended that they undergo a flexible fiberoptic laryngoscopy. All of the risks, benefits, and potential complications were reviewed with the patient preoperatively and verbal informed consent was obtained.  Procedure: The patient was seated upright in the clinic. Topical lidocaine and Afrin were applied to the nasal cavity. After adequate anesthesia had occurred, I then proceeded to pass the flexible telescope into the nasal cavity. The nasal cavity was patent without rhinorrhea or polyp. The nasopharynx was also patent without mass or lesion. The base of tongue was visualized and was normal. There were no signs of pooling of secretions in the piriform sinuses. The true vocal folds were mobile bilaterally. There were no signs of glottic or supraglottic mucosal lesion or mass. There was moderate interarytenoid pachydermia and post cricoid edema. The telescope was then slowly withdrawn and the patient tolerated the procedure throughout.    Studies Reviewed: MRI brain 05/29/19 IMPRESSION: Age normal brain MRI  Esophagram 05/12/23 FINDINGS: Please note, examination is limited due to patient related factors. Examination was tailored to focus on the patient's concern.   Scout: Visualized bilateral lungs are clear. Left lateral costophrenic angle is clear. No free air under the domes of diaphragm.   Double contrast views of the esophageal mucosa are normal-appearing.   Esophageal motility is normal.   There is no stricture, ring or web.   No hiatal hernia.   No spontaneous gastro-esophageal reflux and no reflux with the provocative "water siphon test".   IMPRESSION: *Normal esophagram.  MBS 05/17/23 HPI: Roger Ingram is a 89 y.o. male with a PMH significant for CKD, BMH, renal stones, and colon polyps. He presents today for a modified barium swallow study per complaints of coughing when drinking liquids, voice changes after eating/drinking, and feeling like food is stuck in his throat.    Roger Ingram was seen by SLP for modified barium swallow study per his complaints of violent coughing after drinking liquids and sensation of food getting stuck. Roger Ingram presents with an overall functional oropharyngeal swallow given his age. Penetration occurred with thin liquids, however, it was mostly shallow penetration of trace amounts that expelled from the airway. Roger Ingram had great sensation and nearly always could identify when a bolus entered his airway. Sensed aspiration occurred once with a large bolus via straw. Aspirate expelled from the airway. Multiple swallows were necessary for puree and solid food consistencies. When initiating second swallow for solid food bolus, residual of previous swallow was expelled back into the oral cavity. upon a third swallow, laryngeal clearance was achieved. Hyolaryngeal excursion and epiglottic inversion were both complete and coordinated. Roger Ingram deficits appeared to primarily  be caused by a prominent cricopharyngeal bar along with questionable mild stricture. SLP recommended Roger Ingram continue with his regular diet and thin liquids. He was advised to take small sips/bites and utilize extra sauce or gravy as necessary to assist in the clearance of solid foods. Medication may be taken with puree or pudding like consistencies. No further ST is necessary at this time.     Assessment/Plan: Encounter Diagnoses  Name Primary?   Dysphagia, unspecified type Yes   Dysphonia    Weight loss    Hoarseness    Dysarthria    Mass of left side of neck     Assessment and Plan  Dysphagia and Dysarthria weight loss Difficulty eating, chewing and swallowing with significant weight loss and difficulty eating solid foods. No esophageal stricture on esophagram. Normal swallow study overall with sensate penetration/trace aspiration. I reviewed MBS and there is no evidence of CP bar that would warrant procedural interventions. Patient also having word finding difficulties, dysarthria, his speech is somewhat difficult to understand, and tongue weakness was present on exam. He denies hx of stroke, had normal MRI brain 2020, but his sx have been present for about a year. No Neurology consultation up to this point. Likely related to muscle weakness or neurologic condition. - Refer to neurology for further evaluation - MRI brain w/w/o contrast  - Discuss weight loss and nutritional support with primary care physician - could benefit from Nutrition consult - defer to PCP (he has not seen his PCP for weight loss)  Chronic Voice Changes/Dysphonia for 1 year Significant voice changes with poor projection and raspy voice without vocal cord growth or tumor on scope exam today but evidence of severe VF atrophy and glottic insufficiency as well as findings c/w GERD LPR. Likely due to age-related changes exacerbated by weight loss. Discussed vocal cord injection augmentation procedure, including  risks and benefits. - Provided information on vocal cord injection procedure - Discuss potential vocal cord injection after neurology evaluation if he elects to proceed  Thick Phlegm Thick phlegm causing difficulty in expectoration, affecting sleep and breathing at night. - Evaluate further if symptoms persist after neurology consultation - scope exam with findings c/w GERD LPR and small amount of clear secretions but no pooling of food/saliva in pyriform sinuses  - trial of Flonase for post-nasal drainage to see if this reduces the amount of phlegm, 2 puffs b/l nares BID  Neck mass, enlarged left submandibular gland (unknown duration of sx) Palpable neck lump vs enlarged SMG gland on the left, possibly related to salivary gland fibrosis vs neoplasm vs chronic sialoadenitis. History of melanoma on left forehead with intermittent swelling and soreness in the area. Melanoma excision done many years ago, no recent surveillance for disease recurrence  - Order MRI of neck w/con - Order MRI of brain to rule out metastasis and exclude stroke 2/2 other sx such as dysarthria   Left Forearm and Leg Weakness Weakness in left forearm and legs with essential tremor, affecting ambulation. No history of stroke. - Refer to neurology for further evaluation - Order MRI of brain w/con  Essential Tremor Essential tremor managed with medication. - Continue current medication - see Neurology   General Health Maintenance 89 year old male with recent weight loss difficulties with chewing and eating food, dysarthria voice changes. Discussed importance of addressing weight loss and other health concerns with primary care physician before June appointment. - Follow up with primary care physician before June appointment to discuss weight loss and other health concerns  Follow-up - Schedule MRI of brain and neck - Follow up with neurology after MRI brain results - RTC 2 mo to review results and for sx check      Thank you for allowing me to participate in the care of this patient. Please do not hesitate to contact me with any questions or concerns.   Ashok Croon, MD Otolaryngology [facility] Phone: [phone] Fax: [phone]    10/03/2023, 8:11 AM

## 2023-10-02 NOTE — Patient Instructions (Signed)

## 2023-10-03 MED ORDER — FLUTICASONE PROPIONATE 50 MCG/ACT NA SUSP: 2.0000 | Freq: Every day | NASAL | 6 refills | Status: DC

## 2023-10-05 ENCOUNTER — Encounter: Payer: Self-pay | Admitting: Neurology

## 2023-10-06 ENCOUNTER — Telehealth (INDEPENDENT_AMBULATORY_CARE_PROVIDER_SITE_OTHER): Payer: Self-pay | Admitting: Otolaryngology

## 2023-10-06 NOTE — Telephone Encounter (Signed)
Left a message for the patient to call back so that we can discuss his questions

## 2023-10-06 NOTE — Progress Notes (Signed)
 Assessment/Plan:   Dysphagia with pseudobulbar speech and fasciculations  -concerned for MND  -Modified barium swallow in October, 2024 was normal.  -Barium esophagram in September, 2024 was normal.  -Esophagus was stretched without benefit.  -MRI brain and neck are pending for February 26  -labs today:  cmp, cpk, tsh, b12, PTH, spep/upep immunofix  -EMG ordered and spoke with physician doing EMG about his case 2.  Essential tremor  -Currently off of primidone  -On propranolol, 10 mg twice per day.  Has history of bradycardia and cannot increase that.  3.  History of melanoma, forehead  Subjective:   Roger Ingram was seen today in the movement disorders clinic for neurologic consultation at the request of Ashok Croon, MD.  The consultation is for the evaluation of dysphagia.  I have seen the patient previously, but that was for something different.  He was last seen in 2023 for essential tremor.  However, I have seen him intermittently over the years since 2017.  When I last saw him in 2023, he had mild to moderate essential tremor.  He was on primidone.  He was only on 50 mg at bedtime then, although he had been on higher dosages in the past.  He is currently off of the primidone.  He presents today with swallowing troubles.  He did not have this last time I saw him.  He just saw ENT for his swallowing trouble that he reports has been going on for about 1 year.  He has seen gastroenterology as well.  He reports weight loss (which was clearly denied in gastroenterology notes of September, 2024) because of difficulty chewing and swallowing and food getting stuck on the way down.  He has problems with liquids as well and states that he coughs with liquids and they seem to go down the wrong pipe.  He reports that he has had change in his voice ever since his upper endoscopy by GI and notes a lot of thick white mucus in his throat.  He did have his esophagus stretched.  He saw his ENT  physician and a thorough workup was undertaken.  He has MRI brain and MRA neck pending for February 26.  Patient had modified barium swallow May 17, 2023 that was normal.  Barium esophagram in May 12, 2023 was normal.  Pt states that dysphagia has been going on x 1 year but its been more noticeable since late last summer and early fall.  He also noted that his speech started to change.  He admits to "paramount weakness in my arms and legs."  Last fall was 2 months ago and "I just stumbled on a stick."  He cannot see the muscles jump.  Admits to weight loss of about 12 lbs since November      Lab Results  Component Value Date   TSH 4.49 09/13/2021       ALLERGIES:   Allergies  Allergen Reactions   Hydrocodone Other (See Comments)    Double vision / diplopia   Iohexol Other (See Comments)    Syncope post IV dye for renal calculi 1963    CURRENT MEDICATIONS:  Current Meds  Medication Sig   amLODipine (NORVASC) 10 MG tablet Take 1 tablet (10 mg total) by mouth daily.   aspirin EC 81 MG tablet Take 81 mg by mouth daily.   cholecalciferol (VITAMIN D3) 25 MCG (1000 UNIT) tablet Take 1,000 Units by mouth daily.   finasteride (PROSCAR) 5 MG tablet  Take 1 tablet by mouth once daily   fluticasone (FLONASE) 50 MCG/ACT nasal spray Place 2 sprays into both nostrils daily.   folic acid (FOLVITE) 400 MCG tablet Take 400 mcg by mouth daily.   propranolol (INDERAL) 10 MG tablet TAKE 1 TABLET BY MOUTH TWICE DAILY FOR  ESSENTIAL  TREMOR   tamsulosin (FLOMAX) 0.4 MG CAPS capsule Take 1 capsule by mouth once daily     Objective:   VITALS:   Vitals:   10/09/23 1429  BP: (!) 140/72  Pulse: (!) 58  SpO2: 97%  Weight: 135 lb (61.2 kg)  Height: 5\' 8"  (1.727 m)   Wt Readings from Last 3 Encounters:  10/09/23 135 lb (61.2 kg)  10/02/23 130 lb (59 kg)  09/01/23 138 lb 4 oz (62.7 kg)     GEN:  The patient appears stated age and is in NAD. HEENT:  Normocephalic, atraumatic.  The  mucous membranes are moist. The superficial temporal arteries are without ropiness or tenderness. CV:  RRR Lungs:  CTAB Neck/HEME:  There are no carotid bruits bilaterally.  Neurological examination:  Patient was undressed and placed into examining shorts.  Chaperone Dois Davenport accompanied me into the visit.   Orientation: The patient is alert and oriented x3.  Cranial nerves: There is good facial symmetry.  Extraocular muscles are intact. The visual fields are full to confrontational testing. The speech is fluent with marked bulbar quality.. Soft palate rises symmetrically and there is no tongue deviation. Hearing is intact to conversational tone. Sensation: Sensation is intact to light and pinprick throughout (facial, trunk, extremities). Vibration is decreased distally. There is no extinction with double simultaneous stimulation. There is no sensory dermatomal level identified. Motor: Strength is 5/5 in the bilateral upper and lower extremities (although patient does report that he feels weaker on the left).   Shoulder shrug is equal and symmetric.  There is no pronator drift.  Patient has fasciculations along both upper arms, across the chest and across the upper back/scapular region bilaterally.  No fasciculations were noted in the lower extremities.  There were fasciculations across the tongue. Deep tendon reflexes: Deep tendon reflexes are 2/4 at the bilateral biceps, triceps, brachioradialis, patella and trace at the bilateral achilles. Plantar responses are downgoing bilaterally.   Movement examination: Tone: There is normal tone in the bilateral upper extremities.  The tone in the lower extremities is normal.  Abnormal movements: There is no significant rest tremor.  There is postural tremor that is mild to moderate.   Coordination:  There is no decremation with RAM's, with any form of RAMS, including alternating supination and pronation of the forearm, hand opening and closing, finger taps,  heel taps and toe taps. Gait and Station: The patient is able to get up without the use of his hands.  He is wide-based and just a bit unsteady. I have reviewed and interpreted the following labs independently   Chemistry      Component Value Date/Time   NA 136 07/17/2023 1340   K 4.7 07/17/2023 1340   CL 103 07/17/2023 1340   CO2 26 07/17/2023 1340   BUN 30 (H) 07/17/2023 1340   CREATININE 1.23 07/17/2023 1340      Component Value Date/Time   CALCIUM 9.6 07/17/2023 1340   ALKPHOS 59 07/17/2023 1340   AST 19 07/17/2023 1340   ALT 17 07/17/2023 1340   BILITOT 0.6 07/17/2023 1340      Lab Results  Component Value Date   TSH 4.49 09/13/2021  Lab Results  Component Value Date   WBC 7.2 07/17/2023   HGB 14.6 07/17/2023   HCT 44.4 07/17/2023   MCV 91.5 07/17/2023   PLT 214.0 07/17/2023     Total time spent on today's visit was 60 minutes, including both face-to-face time and nonface-to-face time.  Time included that spent on review of records (prior notes available to me/labs/imaging if pertinent), discussing treatment and goals, answering patient's questions and coordinating care.  Cc:  Pincus Sanes, MD

## 2023-10-06 NOTE — Telephone Encounter (Signed)
Patient has questions regarding a procedure Dr. Irene Pap wanted him to have.  Please call [phone].

## 2023-10-09 ENCOUNTER — Other Ambulatory Visit: Payer: Medicare Other

## 2023-10-09 ENCOUNTER — Encounter: Payer: Self-pay | Admitting: Neurology

## 2023-10-09 ENCOUNTER — Ambulatory Visit (INDEPENDENT_AMBULATORY_CARE_PROVIDER_SITE_OTHER): Payer: Medicare Other | Admitting: Neurology

## 2023-10-09 VITALS — BP 140/72 | HR 58 | Ht 68.0 in | Wt 135.0 lb

## 2023-10-09 DIAGNOSIS — R1319 Other dysphagia: Secondary | ICD-10-CM | POA: Diagnosis not present

## 2023-10-09 DIAGNOSIS — G1220 Motor neuron disease, unspecified: Secondary | ICD-10-CM

## 2023-10-09 DIAGNOSIS — Z5181 Encounter for therapeutic drug level monitoring: Secondary | ICD-10-CM

## 2023-10-09 DIAGNOSIS — R6889 Other general symptoms and signs: Secondary | ICD-10-CM

## 2023-10-09 DIAGNOSIS — G250 Essential tremor: Secondary | ICD-10-CM | POA: Diagnosis not present

## 2023-10-09 DIAGNOSIS — E538 Deficiency of other specified B group vitamins: Secondary | ICD-10-CM

## 2023-10-09 DIAGNOSIS — M791 Myalgia, unspecified site: Secondary | ICD-10-CM | POA: Diagnosis not present

## 2023-10-09 NOTE — Patient Instructions (Signed)
Your provider has requested that you have labwork completed today. The lab is located on the Second floor at [address], within the  Endocrinology office. When you get off the elevator, turn right and go in the  Endocrinology [address]; the first brown door on the left.  Tell the ladies behind the desk that you are there for lab work. If you are not called within 15 minutes please check with the front desk.   Once you complete your labs you are free to go. You will receive a call or message via MyChart with your lab results.    

## 2023-10-11 ENCOUNTER — Telehealth: Payer: Self-pay | Admitting: Internal Medicine

## 2023-10-11 ENCOUNTER — Ambulatory Visit (HOSPITAL_COMMUNITY)
Admission: RE | Admit: 2023-10-11 | Discharge: 2023-10-11 | Disposition: A | Discharge status: Home / Self Care | Payer: Medicare Other | Source: Ambulatory Visit | Attending: Otolaryngology | Admitting: Otolaryngology

## 2023-10-11 ENCOUNTER — Other Ambulatory Visit (INDEPENDENT_AMBULATORY_CARE_PROVIDER_SITE_OTHER): Payer: Self-pay | Admitting: Otolaryngology

## 2023-10-11 DIAGNOSIS — I6782 Cerebral ischemia: Secondary | ICD-10-CM | POA: Diagnosis not present

## 2023-10-11 DIAGNOSIS — R1310 Dysphagia, unspecified: Secondary | ICD-10-CM

## 2023-10-11 DIAGNOSIS — R634 Abnormal weight loss: Secondary | ICD-10-CM

## 2023-10-11 DIAGNOSIS — R471 Dysarthria and anarthria: Secondary | ICD-10-CM

## 2023-10-11 DIAGNOSIS — R490 Dysphonia: Secondary | ICD-10-CM

## 2023-10-11 DIAGNOSIS — R531 Weakness: Secondary | ICD-10-CM | POA: Diagnosis not present

## 2023-10-11 DIAGNOSIS — R221 Localized swelling, mass and lump, neck: Secondary | ICD-10-CM

## 2023-10-11 DIAGNOSIS — E041 Nontoxic single thyroid nodule: Secondary | ICD-10-CM | POA: Diagnosis not present

## 2023-10-11 DIAGNOSIS — E038 Other specified hypothyroidism: Secondary | ICD-10-CM

## 2023-10-11 NOTE — Telephone Encounter (Signed)
 Please call him.  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  I would like to have him repeat this sometime in March.  I have ordered blood work here and he just needs a lab appointment to come in in March and have it done.  No fasting is required.  Thank you.

## 2023-10-12 ENCOUNTER — Emergency Department (HOSPITAL_COMMUNITY): Payer: Medicare Other

## 2023-10-12 ENCOUNTER — Telehealth (INDEPENDENT_AMBULATORY_CARE_PROVIDER_SITE_OTHER): Payer: Self-pay

## 2023-10-12 ENCOUNTER — Other Ambulatory Visit: Payer: Self-pay

## 2023-10-12 ENCOUNTER — Ambulatory Visit: Payer: Self-pay | Admitting: Internal Medicine

## 2023-10-12 ENCOUNTER — Emergency Department (HOSPITAL_COMMUNITY)
Admission: EM | Admit: 2023-10-12 | Discharge: 2023-10-12 | Disposition: A | Discharge status: Home / Self Care | Payer: Medicare Other | Attending: Student | Admitting: Student

## 2023-10-12 DIAGNOSIS — X58XXXA Exposure to other specified factors, initial encounter: Secondary | ICD-10-CM | POA: Diagnosis not present

## 2023-10-12 DIAGNOSIS — Z7982 Long term (current) use of aspirin: Secondary | ICD-10-CM | POA: Diagnosis not present

## 2023-10-12 DIAGNOSIS — N189 Chronic kidney disease, unspecified: Secondary | ICD-10-CM | POA: Insufficient documentation

## 2023-10-12 DIAGNOSIS — S065XAA Traumatic subdural hemorrhage with loss of consciousness status unknown, initial encounter: Secondary | ICD-10-CM | POA: Diagnosis not present

## 2023-10-12 DIAGNOSIS — I129 Hypertensive chronic kidney disease with stage 1 through stage 4 chronic kidney disease, or unspecified chronic kidney disease: Secondary | ICD-10-CM | POA: Insufficient documentation

## 2023-10-12 DIAGNOSIS — R531 Weakness: Secondary | ICD-10-CM | POA: Diagnosis present

## 2023-10-12 DIAGNOSIS — H35372 Puckering of macula, left eye: Secondary | ICD-10-CM | POA: Diagnosis not present

## 2023-10-12 DIAGNOSIS — Z79899 Other long term (current) drug therapy: Secondary | ICD-10-CM | POA: Insufficient documentation

## 2023-10-12 DIAGNOSIS — I6782 Cerebral ischemia: Secondary | ICD-10-CM | POA: Diagnosis not present

## 2023-10-12 DIAGNOSIS — Z87891 Personal history of nicotine dependence: Secondary | ICD-10-CM | POA: Insufficient documentation

## 2023-10-12 DIAGNOSIS — I6200 Nontraumatic subdural hemorrhage, unspecified: Secondary | ICD-10-CM | POA: Diagnosis not present

## 2023-10-12 DIAGNOSIS — R519 Headache, unspecified: Secondary | ICD-10-CM | POA: Diagnosis not present

## 2023-10-12 DIAGNOSIS — H26491 Other secondary cataract, right eye: Secondary | ICD-10-CM | POA: Diagnosis not present

## 2023-10-12 DIAGNOSIS — S065X0A Traumatic subdural hemorrhage without loss of consciousness, initial encounter: Secondary | ICD-10-CM | POA: Diagnosis not present

## 2023-10-12 LAB — CBC
HCT: 40.5 % (ref 39.0–52.0)
Hemoglobin: 13.5 g/dL (ref 13.0–17.0)
MCH: 30.2 pg (ref 26.0–34.0)
MCHC: 33.3 g/dL (ref 30.0–36.0)
MCV: 90.6 fL (ref 80.0–100.0)
Platelets: 193 10*3/uL (ref 150–400)
RBC: 4.47 MIL/uL (ref 4.22–5.81)
RDW: 12.9 % (ref 11.5–15.5)
WBC: 7.8 10*3/uL (ref 4.0–10.5)
nRBC: 0 % (ref 0.0–0.2)

## 2023-10-12 LAB — IMMUNOFIXATION ELECTROPHORESIS
IgG (Immunoglobin G), Serum: 1270 mg/dL (ref 600–1540)
IgM, Serum: 119 mg/dL (ref 50–300)
Immunoglobulin A: 122 mg/dL (ref 70–320)

## 2023-10-12 LAB — TSH: TSH: 5.75 m[IU]/L — ABNORMAL HIGH (ref 0.40–4.50)

## 2023-10-12 LAB — BASIC METABOLIC PANEL
Anion gap: 6 (ref 5–15)
BUN: 25 mg/dL — ABNORMAL HIGH (ref 8–23)
CO2: 25 mmol/L (ref 22–32)
Calcium: 9.3 mg/dL (ref 8.9–10.3)
Chloride: 107 mmol/L (ref 98–111)
Creatinine, Ser: 1.2 mg/dL (ref 0.61–1.24)
GFR, Estimated: 57 mL/min — ABNORMAL LOW (ref >60)
Glucose, Bld: 98 mg/dL (ref 70–99)
Potassium: 4.3 mmol/L (ref 3.5–5.1)
Sodium: 138 mmol/L (ref 135–145)

## 2023-10-12 LAB — COMPREHENSIVE METABOLIC PANEL
AG Ratio: 1.5 (calc) (ref 1.0–2.5)
ALT: 16 U/L (ref 9–46)
AST: 19 U/L (ref 10–35)
Albumin: 4.1 g/dL (ref 3.6–5.1)
Alkaline phosphatase (APISO): 63 U/L (ref 35–144)
BUN/Creatinine Ratio: 24 (calc) — ABNORMAL HIGH (ref 6–22)
BUN: 30 mg/dL — ABNORMAL HIGH (ref 7–25)
CO2: 27 mmol/L (ref 20–32)
Calcium: 9.7 mg/dL (ref 8.6–10.3)
Chloride: 103 mmol/L (ref 98–110)
Creat: 1.25 mg/dL — ABNORMAL HIGH (ref 0.70–1.22)
Globulin: 2.8 g/dL (ref 1.9–3.7)
Glucose, Bld: 101 mg/dL — ABNORMAL HIGH (ref 65–99)
Potassium: 4.5 mmol/L (ref 3.5–5.3)
Sodium: 141 mmol/L (ref 135–146)
Total Bilirubin: 0.3 mg/dL (ref 0.2–1.2)
Total Protein: 6.9 g/dL (ref 6.1–8.1)

## 2023-10-12 LAB — PROTIME-INR
INR: 1 (ref 0.8–1.2)
Prothrombin Time: 13.3 s (ref 11.4–15.2)

## 2023-10-12 LAB — URINALYSIS, ROUTINE W REFLEX MICROSCOPIC
Bilirubin Urine: NEGATIVE
Glucose, UA: NEGATIVE mg/dL
Hgb urine dipstick: NEGATIVE
Ketones, ur: NEGATIVE mg/dL
Leukocytes,Ua: NEGATIVE
Nitrite: NEGATIVE
Protein, ur: NEGATIVE mg/dL
Specific Gravity, Urine: 1.015 (ref 1.005–1.030)
pH: 5 (ref 5.0–8.0)

## 2023-10-12 LAB — VITAMIN B12: Vitamin B-12: 487 pg/mL (ref 200–1100)

## 2023-10-12 LAB — CK: Total CK: 156 U/L (ref 17–247)

## 2023-10-12 LAB — PARATHYROID HORMONE, INTACT (NO CA): PTH: 74 pg/mL (ref 16–77)

## 2023-10-12 MED ORDER — LABETALOL HCL 5 MG/ML IV SOLN
10.0000 mg | Freq: Once | INTRAVENOUS | Status: DC
  Filled 2023-10-12: qty 4

## 2023-10-12 NOTE — ED Notes (Signed)
 Ambulated to the restroom with no complications

## 2023-10-12 NOTE — Telephone Encounter (Signed)
 Dr Irene Pap received a call from radiology about the patient possibly having a brain bleed, not sure when it occurred, they recommended that the patient go to the ED to have additional testing.  Dr Irene Pap spoke to his wife, he was at an eye doctor appointment, she gave his wife all the information as to where to go and what to do,  Dr Irene Pap also sent a message to his neurologist as well

## 2023-10-12 NOTE — ED Provider Triage Note (Signed)
 Emergency Medicine Provider Triage Evaluation Note  Roger Ingram , a 89 y.o. male  was evaluated in triage.  Pt complains of abnormal imaging.  Had an MRI done in the outpatient setting which is concerning for subdural prompted visit to the ED.  States he has had 2 weeks of changes in voice as well as difficulty swallowing ever since EGD with worsening since onset.  Does report blurry vision in his right eye..  Patient is on aspirin.  Review of Systems  Positive: See above Negative:   Physical Exam  BP (!) 145/73   Pulse 64   Temp 97.8 F (36.6 C)   Resp 17   SpO2 98%  Gen:   Awake, no distress   Resp:  Normal effort  MSK:   Moves extremities without difficulty  Other:    Medical Decision Making  Medically screening exam initiated at 12:12 PM.  Appropriate orders placed.  Roger Ingram was informed that the remainder of the evaluation will be completed by another provider, this initial triage assessment does not replace that evaluation, and the importance of remaining in the ED until their evaluation is complete.     Peter Garter, [place] 10/12/23 1213

## 2023-10-12 NOTE — Telephone Encounter (Signed)
 Patient called in seeking clarification on which ED to go to for a brain bleed. Per chart, patient had already been seen and advised to go to the ED for a brain bleed. This RN recommend that the patient go to a nearby ED, with a hospital attached.   Copied from CRM [id number]. Topic: Clinical - Red Word Triage >> Oct 12, 2023  9:25 AM Payton Doughty wrote: Red Word that prompted transfer to Nurse Triage: brain bleed/where to go? Reason for Disposition  Health Information question, no triage required and triager able to answer question  Protocols used: Information Only Call - No Triage-A-AH

## 2023-10-12 NOTE — Telephone Encounter (Signed)
 LM for pt to return my call

## 2023-10-12 NOTE — ED Triage Notes (Signed)
 Pt. Stated, I was sent over here cause I had a brain bleed. Ive had blurred vision in rt. Eye . Ive also having a closed esophagus and difficult swallowing. This is causing me to be weak

## 2023-10-13 NOTE — ED Provider Notes (Addendum)
  EMERGENCY DEPARTMENT AT [facility] Provider Note  CSN: [id number] Arrival date & time: 10/12/23 1050  Chief Complaint(s) Blurred Vision, esophagus closing, Dysphagia, and Weakness  HPI Roger Ingram is a 89 y.o. male with PMH CKD, HTN, HLD, melanoma who presents emerged department for evaluation of abnormal MRI findings.  Patient has been recently suffering from episodes of dysphagia and shortness of breath that is currently being worked up in the outpatient setting by ENT and neurology.  He recently received an MRI yesterday that showed incidental findings of bilateral subdural collections and he was recommended to come to the emergency department for further evaluation.  Patient adamantly denies any history of trauma or fall.  Is currently not on a blood thinner.  Denies any neurologic complaints including numbness, tingling, weakness or other systemic complaints today.   Past Medical History Past Medical History:  Diagnosis Date   Basal cell cancer    BPH (benign prostatic hypertrophy)    Dr Mena Goes   Chronic kidney disease    Hx of clear fluid sacs on both kidneys. Drained about 2006   Diverticulosis of colon    Elevated PSA    [facility]   History of kidney stones    Hx of colonic polyps    Hyperlipidemia    Hypertension    Melanoma [facility])    Patient Active Problem List   Diagnosis Date Noted   Dysphagia 04/14/2023   Cervicogenic headache 11/02/2022   Skin lump of leg, right 06/18/2021   CKD (chronic kidney disease) stage 3, GFR 30-59 ml/min (HCC) 06/03/2021   Neck pain 01/14/2021   BPPV (benign paroxysmal positional vertigo), unspecified laterality 10/06/2020   Hearing loss 10/06/2020   Other peripheral vertigo, unspecified ear 05/24/2019   Prostate nodule 09/27/2018   Renal mass, right 09/27/2018   Chronic left SI joint pain 08/03/2018   Diverticulitis large intestine 02/08/2018   Flank pain 01/31/2018   Osteoarthritis of left hip  12/19/2014   Status post total replacement of left hip 12/19/2014   Degenerative joint disease (DJD) of hip 11/13/2014   Baker's cyst of knee, right 06/17/2014   DDD (degenerative disc disease), lumbosacral 10/26/2013   Essential hypertension 07/06/2009   Prediabetes 10/23/2008   Hyperlipidemia 10/02/2008   Essential tremor 10/02/2008   Diverticulosis of large intestine 10/02/2008   NEURITIS 10/02/2008   MALIGNANT MELANOMA, HX OF 10/02/2008   SKIN CANCER, HX OF 10/02/2008   History of colonic polyps 10/02/2008   BPH (benign prostatic hyperplasia) 06/02/2008   Home Medication(s) Prior to Admission medications   Medication Sig Start Date End Date Taking? Authorizing Provider  amLODipine (NORVASC) 10 MG tablet Take 1 tablet (10 mg total) by mouth daily. 09/13/21   Pincus Sanes, MD  aspirin EC 81 MG tablet Take 81 mg by mouth daily.    [provider]  cholecalciferol (VITAMIN D3) 25 MCG (1000 UNIT) tablet Take 1,000 Units by mouth daily.    [provider]  finasteride (PROSCAR) 5 MG tablet Take 1 tablet by mouth once daily 08/22/23   Pincus Sanes, MD  fluticasone [facility]) 50 MCG/ACT nasal spray Place 2 sprays into both nostrils daily. 10/03/23   Ashok Croon, MD  folic acid (FOLVITE) 400 MCG tablet Take 400 mcg by mouth daily.    [provider]  meclizine (ANTIVERT) 12.5 MG tablet Take 1-2 tablets (12.5-25 mg total) by mouth 3 (three) times daily as needed for dizziness. Patient not taking: Reported on 10/09/2023 05/13/21  Elenore Paddy, NP  propranolol (INDERAL) 10 MG tablet TAKE 1 TABLET BY MOUTH TWICE DAILY FOR  ESSENTIAL  TREMOR 06/12/23   Pincus Sanes, MD  tamsulosin [facility]) 0.4 MG CAPS capsule Take 1 capsule by mouth once daily 10/02/23   Pincus Sanes, MD                                                                                                                                    Past Surgical History Past Surgical History:  Procedure  Laterality Date   basal cell     R  & L temple; Dr Terri Piedra   COLONOSCOPY W/ POLYPECTOMY  1997, 2011   Dr Kinnie Scales   melanoma     L forehead   Right rotator cuff sx     TOTAL HIP ARTHROPLASTY Left 12/19/2014   Procedure: LEFT TOTAL HIP ARTHROPLASTY ANTERIOR APPROACH;  Surgeon: Kathryne Hitch, MD;  Location: [facility]S;  Service: Orthopedics;  Laterality: Left;   Family History Family History  Problem Relation Age of Onset   Colon cancer Brother 78   Other Sister        addisons disease   Dementia Sister    Stroke Brother    Stroke Mother 35   Heart attack Father 15   Stroke Father    Stroke Paternal Grandfather        in 74s   Diabetes Son        IDDM   Crohn's disease Daughter    Drug abuse Son     Social History Social History   Tobacco Use   Smoking status: Former    Types: Cigarettes   Smokeless tobacco: Never   Tobacco comments:    For 1 month when young child  Vaping Use   Vaping status: Never Used  Substance Use Topics   Alcohol use: No    Alcohol/week: 0.0 standard drinks of alcohol   Drug use: No   Allergies Hydrocodone and Iohexol  Review of Systems Review of Systems  All other systems reviewed and are negative.   Physical Exam Vital Signs  I have reviewed the triage vital signs BP (!) 162/78   Pulse (!) 55   Temp (!) 97.4 F (36.3 C)   Resp (!) 23   SpO2 97%   Physical Exam Constitutional:      General: He is not in acute distress.    Appearance: Normal appearance.  HENT:     Head: Normocephalic and atraumatic.     Nose: No congestion or rhinorrhea.  Eyes:     General:        Right eye: No discharge.        Left eye: No discharge.     Extraocular Movements: Extraocular movements intact.     Pupils: Pupils are equal, round, and reactive to light.  Cardiovascular:     Rate and Rhythm: Normal rate and regular  rhythm.     Heart sounds: No murmur heard. Pulmonary:     Effort: No respiratory distress.     Breath sounds: No wheezing  or rales.  Abdominal:     General: There is no distension.     Tenderness: There is no abdominal tenderness.  Musculoskeletal:        General: Normal range of motion.     Cervical back: Normal range of motion.  Skin:    General: Skin is warm and dry.  Neurological:     General: No focal deficit present.     Mental Status: He is alert.     Cranial Nerves: No cranial nerve deficit.     Sensory: No sensory deficit.     Motor: No weakness.     ED Results and Treatments Labs (all labs ordered are listed, but only abnormal results are displayed) Labs Reviewed  BASIC METABOLIC PANEL - Abnormal; Notable for the following components:      Result Value   BUN 25 (*)    GFR, Estimated 57 (*)    All other components within normal limits  CBC  URINALYSIS, ROUTINE W REFLEX MICROSCOPIC  PROTIME-INR                                                                                                                          Radiology CT Head Wo Contrast Result Date: 10/12/2023 CLINICAL DATA:  Headache, increasing frequency or severity. EXAM: CT HEAD WITHOUT CONTRAST TECHNIQUE: Contiguous axial images were obtained from the base of the skull through the vertex without intravenous contrast. RADIATION DOSE REDUCTION: This exam was performed according to the departmental dose-optimization program which includes automated exposure control, adjustment of the mA and/or kV according to patient size and/or use of iterative reconstruction technique. COMPARISON:  Head MRI 10/11/2023 FINDINGS: Brain: A small intermediate attenuation subdural hematoma over the right cerebral convexity measures up to 5 mm in thickness in the frontal region, unchanged from yesterday's MRI. There is no significant associated mass effect. The trace subdural collection over the left cerebral convexity on MRI is not clearly visible by CT. No acute infarct, mass, or midline shift is identified. Mild cerebral atrophy is within normal limits  for age. Patchy hypodensities in the cerebral white matter are nonspecific but compatible with mild chronic small vessel ischemic disease. Vascular: Calcified atherosclerosis at the skull base. No hyperdense vessel. Skull: No acute fracture or suspicious lesion. Sinuses/Orbits: Paranasal sinuses and mastoid air cells are clear. Bilateral cataract extraction. Other: None. IMPRESSION: 1. Unchanged small right-sided subdural hematoma. No significant mass effect. 2. The tiny left-sided subdural collection on MRI is not clearly visible by CT. 3. Mild chronic small vessel ischemic disease. Electronically Signed   By: Sebastian Ache M.D.   On: 10/12/2023 12:41    Pertinent labs & imaging results that were available during my care of the patient were reviewed by me and considered in my medical decision making (see MDM for details).  Medications Ordered in ED Medications - No data to display                                                                                                                                   Procedures Procedures  (including critical care time)  Medical Decision Making / ED Course   This patient presents to the ED for concern of abnormal MRI findings, this involves an extensive number of treatment options, and is a complaint that carries with it a high risk of complications and morbidity.  The differential diagnosis includes acute subdural, chronic subdural, hygroma, mass  MDM: Seen emergency room for evaluation of an abnormal MRI.  Physical exam is reassuringly unremarkable with no focal motor or sensory deficits.  No cranial nerve deficits.  Laboratory evaluation is reassuringly unremarkable including a normal INR.  Follow-up head CT showing an unchanged small right-sided subdural hematoma with no mass effect, nonvisualization of the small left-sided subdural collection.  Spoke with the neurosurgeon on-call Dr. Conchita Paris who is recommending outpatient follow-up in 2 weeks in the  clinic.  Outpatient neurosurgery referral placed. at this time he does not meet inpatient criteria for admission and will be discharged with outpatient follow-up.  Return precautions given of which he voiced understanding   Additional history obtained: -Additional history obtained from multiple family members -External records from outside source obtained and reviewed including: Chart review including previous notes, labs, imaging, consultation notes   Lab Tests: -I ordered, reviewed, and interpreted labs.   The pertinent results include:   Labs Reviewed  BASIC METABOLIC PANEL - Abnormal; Notable for the following components:      Result Value   BUN 25 (*)    GFR, Estimated 57 (*)    All other components within normal limits  CBC  URINALYSIS, ROUTINE W REFLEX MICROSCOPIC  PROTIME-INR         Imaging Studies ordered: I ordered imaging studies including CT head I independently visualized and interpreted imaging. I agree with the radiologist interpretation   Medicines ordered and prescription drug management: Meds ordered this encounter  Medications   DISCONTD: labetalol (NORMODYNE) injection 10 mg    -I have reviewed the patients home medicines and have made adjustments as needed  Critical interventions none  Consultations Obtained: I requested consultation with the neurosurgeon on-call Dr. Conchita Paris,  and discussed lab and imaging findings as well as pertinent plan - they recommend: Outpatient 2-week follow-up   Cardiac Monitoring: The patient was maintained on a cardiac monitor.  I personally viewed and interpreted the cardiac monitored which showed an underlying rhythm of: NSR  Social Determinants of Health:  Factors impacting patients care include: none   Reevaluation: After the interventions noted above, I reevaluated the patient and found that they have :stayed the same  Co morbidities that complicate the patient evaluation  Past Medical History:   Diagnosis Date   Basal cell cancer  BPH (benign prostatic hypertrophy)    Dr Mena Goes   Chronic kidney disease    Hx of clear fluid sacs on both kidneys. Drained about 2006   Diverticulosis of colon    Elevated PSA    [facility]   History of kidney stones    Hx of colonic polyps    Hyperlipidemia    Hypertension    Melanoma (HCC)       Dispostion: I considered admission for this patient, but at this time he does not inpatient criteria for admission and will be discharged with outpatient follow-up     Final Clinical Impression(s) / ED Diagnoses Final diagnoses:  SDH (subdural hematoma) [facility])     @PCDICTATION @    Glendora Score, MD 10/13/23 1116    Glendora Score, MD 10/13/23 1116

## 2023-10-16 ENCOUNTER — Ambulatory Visit: Payer: Medicare Other | Admitting: Neurology

## 2023-10-23 NOTE — Telephone Encounter (Signed)
 Called and spoke with patient and informed him of advise from Burns MD. Patient expressed understanding and noted he would stop by sometime within the next week or two to have the labs drawn.

## 2023-10-24 ENCOUNTER — Other Ambulatory Visit (INDEPENDENT_AMBULATORY_CARE_PROVIDER_SITE_OTHER)

## 2023-10-24 DIAGNOSIS — E038 Other specified hypothyroidism: Secondary | ICD-10-CM | POA: Diagnosis not present

## 2023-10-24 LAB — T4, FREE: Free T4: 0.84 ng/dL (ref 0.60–1.60)

## 2023-10-24 LAB — T3, FREE: T3, Free: 2.8 pg/mL (ref 2.3–4.2)

## 2023-10-24 LAB — TSH: TSH: 5.02 u[IU]/mL (ref 0.35–5.50)

## 2023-10-24 NOTE — Telephone Encounter (Signed)
 Spoke with patient and lab appointment made

## 2023-10-24 NOTE — Telephone Encounter (Signed)
 Copied from CRM [id number]. Topic: General - Other >> Oct 24, 2023 10:54 AM Jon Gills C wrote: Reason for CRM: Patient called in stating that he received a call from someone and told him that he needs to draw some blood for his thyroid over at [facility] office, wanted to know if he could come in later on this afternoon to have that done. Is requesting a callback regarding this

## 2023-10-25 ENCOUNTER — Encounter: Payer: Self-pay | Admitting: Gastroenterology

## 2023-10-25 ENCOUNTER — Ambulatory Visit: Admitting: Gastroenterology

## 2023-10-25 VITALS — BP 120/60 | HR 68 | Ht 65.0 in | Wt 134.1 lb

## 2023-10-25 DIAGNOSIS — R490 Dysphonia: Secondary | ICD-10-CM | POA: Diagnosis not present

## 2023-10-25 DIAGNOSIS — S065XAA Traumatic subdural hemorrhage with loss of consciousness status unknown, initial encounter: Secondary | ICD-10-CM | POA: Diagnosis not present

## 2023-10-25 DIAGNOSIS — R0601 Orthopnea: Secondary | ICD-10-CM

## 2023-10-25 DIAGNOSIS — R1310 Dysphagia, unspecified: Secondary | ICD-10-CM

## 2023-10-25 NOTE — Patient Instructions (Signed)
 Follow up as needed.   [facility] will call you on Friday.  _______________________________________________________  If your blood pressure at your visit was 140/90 or greater, please contact your primary care physician to follow up on this.  _______________________________________________________  If you are age 88 or older, your body mass index should be between 23-30. Your Body mass index is 22.32 kg/m. If this is out of the aforementioned range listed, please consider follow up with your Primary Care Provider.  If you are age 23 or younger, your body mass index should be between 19-25. Your Body mass index is 22.32 kg/m. If this is out of the aformentioned range listed, please consider follow up with your Primary Care Provider.   ________________________________________________________  The [place] [facility] providers would like to encourage you to use [facility] to communicate with providers for non-urgent requests or questions.  Due to long hold times on the telephone, sending your provider a message by [facility] may be a faster and more efficient way to get a response.  Please allow 48 business hours for a response.  Please remember that this is for non-urgent requests.  _______________________________________________________   It was a pleasure to see you today!  Thank you for trusting me with your gastrointestinal care!

## 2023-10-25 NOTE — Progress Notes (Signed)
 This is a very challenging situation.  He is already had an EGD with dilation to 17 mm without much of any benefit to his symptoms.  I had him see ENT and they thought his symptoms were more likely neurologic in etiology.  He is undergoing neurologic evaluation currently and that could be the cause of his symptoms.  He needs to complete his neurologic workup first before we consider any further intervention.  If they do not think it is related to an underlying neurologic cause and this is really bothering him could consider repeat EGD with dilation however higher than average risk for anesthesia, it remains unclear to me if further dilation would even help him.  Am sorry he was frustrated surprised by not seeing me today, although he was never scheduled with me originally.

## 2023-10-25 NOTE — Progress Notes (Addendum)
 Chief Complaint: Dysphagia Primary GI MD: Dr. Adela Lank  HPI: 89 year old male with medical history as listed below presents for evaluation of dysphagia.  Last seen 09/01/2023 by Dr. Adela Lank.  Please see his note for details.  Essentially patient was having dysphagia with both solids and liquids and had extensive workup including barium esophagram, MBS, and EGD.  No improvement in dysphagia s/p dilation and MBS suggested a cricopharyngeal bar causing his symptoms.  Due to weight loss and voice change he had a urgent referral to ENT.  He was evaluated by ENT who felt his deficits were primarily caused by prominent cricopharyngeal bar along with questionable mild stricture and he was recommended to resume his regular diet and thin liquids.  He was recently seen in the ED for abnormal MRI findings.  He was seen in the outpatient setting by ENT and neurology for dysphagia and shortness of breath and had MRI which showed incidental findings of bilateral subdural collections.  Follow-up head CT showed unchanged small right sided subdural hematoma with no mass effect.  Neurosurgeon was on-call and recommended outpatient follow-up in 2 weeks in the clinic.  -------------------TODAY--------------------------------  Patient states he is "pissed" and notes his frustration with not seeing Dr. Adela Lank today as he was under the impression his appointment was with Dr. Adela Lank and not an APP.Marland Kitchen  He states he has many questions.  Notes he continues to have trouble swallowing and feels he needs a repeat EGD.  He severely struggles with pills especially his blood pressure pill and this concerns him.  He also notes shortness of breath at night which is worse with lying down.  He states anytime he lies flat he will have shortness of breath.  He does not have a cardiologist and he feels this is new for him.  He uses a wedge pillow which helps.  Denies chest pain.  PREVIOUS GI WORKUP   Barium swallow  05/12/23: IMPRESSION: *Normal esophagram.     Modified barium swallow 05/17/23: Mr. Thetford was seen by SLP for modified barium swallow study per his complaints of violent coughing after drinking liquids and sensation of food getting stuck. Mr. Mckelvy presents with an overall functional oropharyngeal swallow given his age. Penetration occurred with thin liquids, however, it was mostly shallow penetration of trace amounts that expelled from the airway. Mr. Colina had great sensation and nearly always could identify when a bolus entered his airway. Sensed aspiration occurred once with a large bolus via straw. Aspirate expelled from the airway. Multiple swallows were necessary for puree and solid food consistencies. When initiating second swallow for solid food bolus, residual of previous swallow was expelled back into the oral cavity. upon a third swallow, laryngeal clearance was achieved. Hyolaryngeal excursion and epiglottic inversion were both complete and coordinated. Mr. Hoopes deficits appeared to primarily be caused by a prominent cricopharyngeal bar along with questionable mild stricture. SLP recommended Mr. Withrow continue with his regular diet and thin liquids. He was advised to take small sips/bites and utilize extra sauce or gravy as necessary to assist in the clearance of solid foods. Medication may be taken with puree or pudding like consistencies. No further ST is necessary at this time.   EGD 06/16/23: - Esophagogastric landmarks were identified: the Z-line was found at 37 cm, the gastroesophageal junction was found at 37 cm and the upper extent of the gastric folds was found at 40 cm from the incisors. Findings: - A 3 cm hiatal hernia was present. - One benign-appearing, intrinsic  mild stenosis was found 37 cm from the incisors, at [facility] / entrance to hernia sac. - The exam of the esophagus was otherwise normal. - A guidewire was placed and the scope was withdrawn. Dilation was performed in the  entire esophagus with a Savary dilator with mild resistance at 17 mm. Relook endoscopy showed appropriate dilation effect at the GEJ, while no mucosal disruption was noted in the proximal esophagus. - The entire examined stomach was normal. - Nodular mucosa was found in the duodenal bulb. Suspect benign ectopic gastric mucosa but biopsies were taken with a cold forceps for histology to rule out adenomatous changes. - The exam of the duodenum was otherwise normal.   FINAL DIAGNOSIS        1. Surgical [P], duodenal :       - GASTRIC OXYNTIC MUCOSA WITH FOCAL INTESTINAL-TYPE MUCOSA.  SEE NOTE.        Diagnosis Note : - The location of the biopsy labeled as duodenum is noted.  In       this context the findings represent a focus of gastric heterotopia with gastric       oxyntic-type mucosa adjacent to intestinal type mucosa.  Clinical and endoscopic       correlation is recommended.   Colonoscopy 05/2010: 9mm rectosigmoid polyp, diverticulosis, hemorrhoids    Past Medical History:  Diagnosis Date   Basal cell cancer    BPH (benign prostatic hypertrophy)    Dr Mena Goes   Chronic kidney disease    Hx of clear fluid sacs on both kidneys. Drained about 2006   Diverticulosis of colon    Elevated PSA    [facility]   History of kidney stones    Hx of colonic polyps    Hyperlipidemia    Hypertension    Melanoma (HCC)     Past Surgical History:  Procedure Laterality Date   basal cell     R  & L temple; Dr Terri Piedra   COLONOSCOPY W/ POLYPECTOMY  1997, 2011   Dr Kinnie Scales   melanoma     L forehead   Right rotator cuff sx     TOTAL HIP ARTHROPLASTY Left 12/19/2014   Procedure: LEFT TOTAL HIP ARTHROPLASTY ANTERIOR APPROACH;  Surgeon: Kathryne Hitch, MD;  Location: [facility];  Service: Orthopedics;  Laterality: Left;    Current Outpatient Medications  Medication Sig Dispense Refill   amLODipine (NORVASC) 10 MG tablet Take 1 tablet (10 mg total) by mouth daily. 90 tablet    aspirin EC  81 MG tablet Take 81 mg by mouth daily.     cholecalciferol (VITAMIN D3) 25 MCG (1000 UNIT) tablet Take 1,000 Units by mouth daily.     finasteride (PROSCAR) 5 MG tablet Take 1 tablet by mouth once daily 90 tablet 0   folic acid (FOLVITE) 400 MCG tablet Take 400 mcg by mouth daily.     propranolol (INDERAL) 10 MG tablet TAKE 1 TABLET BY MOUTH TWICE DAILY FOR  ESSENTIAL  TREMOR 180 tablet 0   tamsulosin (FLOMAX) 0.4 MG CAPS capsule Take 1 capsule by mouth once daily 90 capsule 0   meclizine (ANTIVERT) 12.5 MG tablet Take 1-2 tablets (12.5-25 mg total) by mouth 3 (three) times daily as needed for dizziness. (Patient not taking: Reported on 10/02/2023) 40 tablet 1   No current facility-administered medications for this visit.    Allergies as of 10/25/2023 - Review Complete 10/25/2023  Allergen Reaction Noted   Hydrocodone Other (See Comments)  Iohexol Other (See Comments) 07/27/2004    Family History  Problem Relation Age of Onset   Colon cancer Brother 39   Other Sister        addisons disease   Dementia Sister    Stroke Brother    Stroke Mother 16   Heart attack Father 31   Stroke Father    Stroke Paternal Grandfather        in 87s   Diabetes Son        IDDM   Crohn's disease Daughter    Drug abuse Son     Social History   Socioeconomic History   Marital status: Married    Spouse name: Claris Gower   Number of children: 3   Years of education: Not on file   Highest education level: Professional school degree (e.g., MD, DDS, DVM, JD)  Occupational History   Occupation: retired    Comment: pilot  Tobacco Use   Smoking status: Former    Types: Cigarettes   Smokeless tobacco: Never   Tobacco comments:    For 1 month when young child  Vaping Use   Vaping status: Never Used  Substance and Sexual Activity   Alcohol use: No    Alcohol/week: 0.0 standard drinks of alcohol   Drug use: No   Sexual activity: Not on file  Other Topics Concern   Not on file  Social History  Narrative   Walks twice daily for exercise   Pt lives at home with spouse 1 story home- he has 3 children 2 which are living   Pt can use both hands to write   Retired Occupational hygienist    one   Social Drivers of Corporate investment banker Strain: Low Risk  (06/08/2023)   Overall Financial Resource Strain (CARDIA)    Difficulty of Paying Living Expenses: Not hard at all  Food Insecurity: No Food Insecurity (06/08/2023)   Hunger Vital Sign    Worried About Running Out of Food in the Last Year: Never true    Ran Out of Food in the Last Year: Never true  Transportation Needs: No Transportation Needs (06/08/2023)   PRAPARE - Administrator, Civil Service (Medical): No    Lack of Transportation (Non-Medical): No  Physical Activity: Insufficiently Active (06/08/2023)   Exercise Vital Sign    Days of Exercise per Week: 7 days    Minutes of Exercise per Session: 10 min  Stress: No Stress Concern Present (06/08/2023)   [employer] of Occupational Health - Occupational Stress Questionnaire    Feeling of Stress : Not at all  Social Connections: Moderately Isolated (06/08/2023)   Social Connection and Isolation Panel [NHANES]    Frequency of Communication with Friends and Family: More than three times a week    Frequency of Social Gatherings with Friends and Family: Once a week    Attends Religious Services: Never    Database administrator or Organizations: No    Attends Banker Meetings: Never    Marital Status: Married  Catering manager Violence: Not At Risk (06/08/2023)   Humiliation, Afraid, Rape, and Kick questionnaire    Fear of Current or Ex-Partner: No    Emotionally Abused: No    Physically Abused: No    Sexually Abused: No    Review of Systems:    Constitutional: No weight loss, fever, chills, weakness or fatigue HEENT: Eyes: No change in vision  Ears, Nose, Throat:  No change in hearing or congestion Skin: No rash or  itching Cardiovascular: No chest pain, chest pressure or palpitations   Respiratory: No SOB or cough Gastrointestinal: See HPI and otherwise negative Genitourinary: No dysuria or change in urinary frequency Neurological: No headache, dizziness or syncope Musculoskeletal: No new muscle or joint pain Hematologic: No bleeding or bruising Psychiatric: No history of depression or anxiety    Physical Exam:  Vital signs: BP 120/60 (BP Location: Left Arm, Patient Position: Sitting, Cuff Size: Normal)   Pulse 68   Ht 5\' 5"  (1.651 m) Comment: height measured without shoes  Wt 134 lb 2 oz (60.8 kg)   BMI 22.32 kg/m   Constitutional: NAD, frail, thin, elderly male.  Essential tremor. Head:  Normocephalic and atraumatic. Eyes:   PEERL, EOMI. No icterus. Conjunctiva pink. Respiratory: Respirations even and unlabored. Lungs clear to auscultation bilaterally.   No wheezes, crackles, or rhonchi.  Cardiovascular:  Regular rate and rhythm. No peripheral edema, cyanosis or pallor.  Rectal:  Not performed.  Msk:  Symmetrical without gross deformities. Without edema, no deformity or joint abnormality.  Neurologic:  Alert and  oriented x4; essential tremor noted with dysphonia  skin:   Dry and intact without significant lesions or rashes. Psychiatric: Oriented to person, place and time. Demonstrates good judgement and reason without abnormal affect or behaviors.  RELEVANT LABS AND IMAGING: CBC    Component Value Date/Time   WBC 7.8 10/12/2023 1155   RBC 4.47 10/12/2023 1155   HGB 13.5 10/12/2023 1155   HCT 40.5 10/12/2023 1155   PLT 193 10/12/2023 1155   MCV 90.6 10/12/2023 1155   MCH 30.2 10/12/2023 1155   MCHC 33.3 10/12/2023 1155   RDW 12.9 10/12/2023 1155   LYMPHSABS 2.6 07/17/2023 1340   MONOABS 0.7 07/17/2023 1340   EOSABS 0.3 07/17/2023 1340   BASOSABS 0.1 07/17/2023 1340    CMP     Component Value Date/Time   NA 138 10/12/2023 1155   K 4.3 10/12/2023 1155   CL 107 10/12/2023  1155   CO2 25 10/12/2023 1155   GLUCOSE 98 10/12/2023 1155   BUN 25 (H) 10/12/2023 1155   CREATININE 1.20 10/12/2023 1155   CREATININE 1.25 (H) 10/09/2023 1533   CALCIUM 9.3 10/12/2023 1155   PROT 6.9 10/09/2023 1533   ALBUMIN 4.3 07/17/2023 1340   AST 19 10/09/2023 1533   ALT 16 10/09/2023 1533   ALKPHOS 59 07/17/2023 1340   BILITOT 0.3 10/09/2023 1533   GFRNONAA 57 (L) 10/12/2023 1155   GFRAA 57 (L) 02/05/2018 1130     Assessment/Plan:   Patient with persistent dysphagia and extensive evaluation with normal barium swallow, MBS suggesting cricopharyngeal bar causing his symptoms.  No aspiration.  EGD with dilation to 17 mm (this did cause dilation effect at GEJ at the site of subdural stricture).  Referral to ENT who felt dysphagia was due to prominent cricopharyngeal bar and possibly stricture.  Recent MRI with concerning findings which led to CT which showed subdural hematoma 10/12/2023 and he is set to follow-up with outpatient neurosurgery for further evaluation. Also having orthopnea which is new and has not seen cardiology.  Chronic dysphagia Although likely from prominent cricopharyngeal bar.  Dr. Adela Lank suggested consideration of possibly repeating EGD with larger dilation of the UES if continued dysphagia post ENT evaluation.  In light of his recent diagnosis with subdural hematoma and plans to follow-up with outpatient neurosurgeon (scheduled tomorrow), it is likely best for him  to complete this workup prior to undergoing any endoscopic procedures.Marland Kitchen  He was cleared by ENT to continue current diet. Will discuss further with Dr. Adela Lank. . Ample time was given and multiple questions were answered with patient.  Orthopnea Shortness of breath with lying down especially at night with no previous cardiac workup.  He would like to see cardiologist that Dr. Adela Lank recommends.  Will likely need cardiac workup prior to undergoing endoscopic procedures.  Subdural Hematoma Set  to follow-up with neurology tomorrow.  Would need neurology clearance prior to undergoing endoscopic procedures.  Dysphonia With his history of dysphagia, change in voice, weight loss, tremor, cannot rule out neurologic etiology of these multiple symptoms.  Even his shortness of breath could be neurologic in nature. Will await neurology appt.   PLAN - Await neurology appointment tomorrow - Cardiologist recommendation per Dr. Adela Lank - Pending neurology and cardiac workup likely consider repeat EGD   This visit required 38 minutes of patient care (this includes precharting, chart review, review of results, face-to-face time used for counseling as well as treatment plan and follow-up. The patient was provided an opportunity to ask questions and all were answered. The patient agreed with the plan and demonstrated an understanding of the instructions.   For future reference: patient prefers appts with Dr. Adela Lank only. No APPs  Gavin Telford Jolee Ewing [facility] 10/25/2023, 3:28 PM  Cc: Pincus Sanes, MD

## 2023-10-26 ENCOUNTER — Ambulatory Visit: Payer: Self-pay | Admitting: Neurology

## 2023-10-26 DIAGNOSIS — G1220 Motor neuron disease, unspecified: Secondary | ICD-10-CM | POA: Diagnosis not present

## 2023-10-27 ENCOUNTER — Other Ambulatory Visit: Payer: Self-pay | Admitting: Internal Medicine

## 2023-10-27 NOTE — Procedures (Signed)
 [facility]  [address] [place], [address]  [place], [place] [address] Tel: [phone] Fax: [phone] Test Date:  10/26/2023  Patient: Roger Ingram DOB: Jul 15, 1929 Physician: Nita Sickle, DO  Sex: Male Height: 5\' 8"  Ref Phys: Kerin Salen, DO  ID#: [id number]   Technician:    History: This is a 89 year old man referred for evaluation of progressive dysphagia, dysarthria, and generalized weakness.  NCV & EMG Findings: Extensive electrodiagnostic testing of the left upper and lower extremities with additional studies of the thoracic paraspinal muscles shows:  Left median, ulnar, and radial sensory responses show prolonged latency and reduced amplitudes.  Left sural and superficial peroneal sensory responses are absent. Left median and ulnar motor responses show prolonged latency, reduced amplitude, and mildly slowed conduction velocity.  There is no evidence of temporal dispersion or conduction block.  Left tibial and peroneal (EDB) motor responses are absent.  Left peroneal motor response at the tibialis anterior shows reduced amplitude. Left tibial H reflex study is absent. In the left upper extremity, active on chronic motor axonal loss changes are seen affecting nearly all the tested muscles involving the C5-T1 myotomes. In the left lower extremity, chronic motor axonal loss changes are seen affecting all the tested muscles involving the L3-S1 myotomes, without accompanying active denervation. Active denervation is present in the thoracic paraspinal muscles at T7 and T11. Active on chronic motor axon loss changes are seen in the mentalis and hyalosis muscles. Fasciculations are seen in 10 of 17 tested muscles.  Impression: This is a complex study.  Findings show: Length-dependent sensorimotor polyneuropathy, with axonal and demyelinating features, affecting the left upper and lower extremities. Additionally, there is evidence of a widespread disorder of anterior horn cells  affecting the bulbar, cervical, and thoracic segments consistent with a clinical diagnosis of motor neuron disease (amyotrophic lateral sclerosis).   ___________________________ Nita Sickle, DO    Nerve Conduction Studies   Stim Site NR Peak (ms) Norm Peak (ms) O-P Amp (V) Norm O-P Amp  Left Median Anti Sensory (2nd Digit)  32 C  Wrist    *4.2 <3.8 *5.8 >10  Left Radial Anti Sensory (Base 1st Digit)  32 C  Wrist    *2.9 <2.8 *6.8 >10  Left Sup Peroneal Anti Sensory (Ant Lat Mall)  32 C  12 cm *NR  <4.6  >3  Left Sural Anti Sensory (Lat Mall)  32 C  Calf *NR  <4.6  >3  Left Ulnar Anti Sensory (5th Digit)  32 C  Wrist    *3.8 <3.2 *3.6 >5     Stim Site NR Onset (ms) Norm Onset (ms) O-P Amp (mV) Norm O-P Amp Site1 Site2 Delta-0 (ms) Dist (cm) Vel (m/s) Norm Vel (m/s)  Left Median Motor (Abd Poll Brev)  32 C  Wrist    *4.9 <4.0 *1.7 >5 Elbow Wrist 7.4 30.0 *41 >50  Elbow    12.3  1.6         Left Peroneal Motor (Ext Dig Brev)  32 C  Ankle *NR  <6.0  >2.5 B Fib Ankle  0.0  >40  B Fib *NR     Poplt B Fib  0.0  >40  Poplt *NR            Left Peroneal TA Motor (Tib Ant)  32 C  Fib Head    4.4 <4.5 *2.3 >3 Poplit Fib Head 1.3 8.0 62 >40  Poplit    5.7 <5.7 2.3  Left Tibial Motor (Abd Hall Brev)  32 C  Ankle *NR  <6.0  >4 Knee Ankle  0.0  >40  Knee *NR            Left Ulnar Motor (Abd Dig Minimi)  32 C  Wrist    *4.4 <3.1 *2.9 >7 B Elbow Wrist 5.1 23.0 *45 >50  B Elbow    9.5  2.7  A Elbow B Elbow 2.3 10.0 *43 >50  A Elbow    11.8  2.5          Electromyography   Side Muscle Ins.Act Fibs Fasc Recrt Amp Dur Poly Activation Comment  Left 1stDorInt Nml Nml *1+ *2- *1+ *1+ *1+ Nml *ATR  Left Abd Poll Brev Nml Nml *1+ *3- *1+ *1+ *1+ Nml *ATR  Left PronatorTeres Nml *1+ *2+ *2- *1+ *1+ *1+ Nml *ATR  Left Biceps Nml *1+ *2+ *2- *1+ *1+ *1+ Nml *ATR  Left Triceps Nml *1+ *2+ *3- *1+ *1+ *1+ Nml *ATR  Left Deltoid Nml *1+ *2+ *3- *1+ *1+ *1+ Nml *ATR  Left Cervical  Parasp Low Nml *1+ *2+ NE *- *- *- *N.E. N/A  Left AntTibialis Nml Nml Nml *2- *1+ *1+ *1+ Nml N/A  Left Gastroc Nml Nml Nml *2- *1+ *1+ *1+ Nml N/A  Left RectFemoris Nml Nml Nml *2- *1+ *1+ *1+ Nml N/A  Left BicepsFemS Nml Nml Nml *2- *1+ *1+ *1+ Nml N/A  Left GluteusMed Nml Nml Nml *2- *1+ *1+ *1+ Nml N/A  Left Lumbo Parasp Low Nml Nml Nml NE *- *- *- Nml N/A  Left T7 Parasp Nml *1+ *2+ NE *- *- *- Nml N/A  Left T11 Parasp Nml *1+ *1+ NE *- *- *- Nml N/A  Left Mentalis Nml *1+ Nml *2- *1+ *1+ *1+ Nml N/A  Left Hyoglossus Nml *1+ *1+ *2- *1+ *1+ *1+ Nml N/A      Waveforms:

## 2023-10-30 ENCOUNTER — Ambulatory Visit (INDEPENDENT_AMBULATORY_CARE_PROVIDER_SITE_OTHER): Admitting: Neurology

## 2023-10-30 ENCOUNTER — Ambulatory Visit: Admitting: Clinical

## 2023-10-30 VITALS — BP 138/78 | HR 78 | Ht 68.0 in | Wt 135.4 lb

## 2023-10-30 DIAGNOSIS — G1221 Amyotrophic lateral sclerosis: Secondary | ICD-10-CM | POA: Diagnosis not present

## 2023-10-30 DIAGNOSIS — F54 Psychological and behavioral factors associated with disorders or diseases classified elsewhere: Secondary | ICD-10-CM | POA: Diagnosis not present

## 2023-10-30 NOTE — Progress Notes (Signed)
 Assessment/Plan:   Motor neuron disease, bulbar onset with significant dysphagia and weight loss  -Modified barium swallow in October, 2024 was normal, despite the fact that patient has significant clinical difficulty swallowing.  -Barium esophagram in September, 2024 was normal.  -Esophagus was stretched without benefit.  -MRI brain with bilateral subdural hematomas  -MRI cervical spine has not been completed.  I did not order that today, although this certainly can be a part of the workup for motor neuron disease.  I am going to hold on this right now, as this would purely be for completeness sake as he meets criteria without it, has bulbar onset sx's and had abnormalities in thoracic EMG.  -EMG completed October 26, 2023 consistent with motor neuron disease  -He and I discussed prognosis, particularly with bulbar onset in this age group.  We discussed medications, but also discussed that may patients choose not to take them.  We discussed palliative and hospice care.  Patient is experiencing air hunger when he lays down at night.  -Patient met with our social worker today.  -Patient has a follow-up appointment next week with Dr. Allena Katz.  He was asked to bring his family.  -Patient asked many appropriate questions and I answered those to the best of my ability today. 2.  Essential tremor  -Currently off of primidone  -On propranolol, 10 mg twice per day.  Has history of bradycardia and cannot increase that.  3.  Bilateral subdural hematomas  -These were both found incidental on his workup for dysphagia, pseudobulbar speech.  He was sent to the ER following the scan by his ENT physician.  The subdural hematomas are very small.  Arrangement was made for follow-up with Dr. Conchita Paris by the emergency room and pt did that with no further follow-up recommended.    Subjective:   Roger Ingram was seen today in f/u.  Patient had MRI brain done since our last visit that I personally reviewed.  This  was ordered by his ENT doctor, although I did talk to his ENT physician following the results.  This demonstrated a small, likely incidental, 3 mm subdural hematoma over the left cerebral convexity and 5 mm subdural over the right cerebral convexity.  Interestingly, patient denies any falls.  He denies any hitting of the head.  He is not on a blood thinner.  He did see Dr. Conchita Paris, and was told that he did not need to follow-up further.  He notes air hunger when he tries to lay down at night.  He is noting more weakness, especially in the left leg and some in the left arm.  Having significant dysphagia.     Lab Results  Component Value Date   TSH 5.02 10/24/2023       ALLERGIES:   Allergies  Allergen Reactions   Hydrocodone Other (See Comments)    Double vision / diplopia   Iohexol Other (See Comments)    Syncope post IV dye for renal calculi 1963    CURRENT MEDICATIONS:  Current Meds  Medication Sig   amLODipine (NORVASC) 10 MG tablet Take 1 tablet (10 mg total) by mouth daily.   aspirin EC 81 MG tablet Take 81 mg by mouth daily.   cholecalciferol (VITAMIN D3) 25 MCG (1000 UNIT) tablet Take 1,000 Units by mouth daily.   finasteride (PROSCAR) 5 MG tablet Take 1 tablet by mouth once daily   folic acid (FOLVITE) 400 MCG tablet Take 400 mcg by mouth daily.  meclizine (ANTIVERT) 12.5 MG tablet Take 1-2 tablets (12.5-25 mg total) by mouth 3 (three) times daily as needed for dizziness.   propranolol (INDERAL) 10 MG tablet TAKE 1 TABLET BY MOUTH TWICE DAILY FOR ESSENTIAL TREMOR   tamsulosin (FLOMAX) 0.4 MG CAPS capsule Take 1 capsule by mouth once daily     Objective:   VITALS:   Vitals:   10/30/23 0859  BP: 138/78  Pulse: 78  SpO2: 98%  Weight: 135 lb 6.4 oz (61.4 kg)  Height: 5\' 8"  (1.727 m)    Wt Readings from Last 3 Encounters:  10/30/23 135 lb 6.4 oz (61.4 kg)  10/25/23 134 lb 2 oz (60.8 kg)  10/09/23 135 lb (61.2 kg)     100% of our visit was spent in  counseling.  He was alert and oriented.  His speech was bulbar. I have reviewed and interpreted the following labs independently   Chemistry      Component Value Date/Time   NA 138 10/12/2023 1155   K 4.3 10/12/2023 1155   CL 107 10/12/2023 1155   CO2 25 10/12/2023 1155   BUN 25 (H) 10/12/2023 1155   CREATININE 1.20 10/12/2023 1155   CREATININE 1.25 (H) 10/09/2023 1533      Component Value Date/Time   CALCIUM 9.3 10/12/2023 1155   ALKPHOS 59 07/17/2023 1340   AST 19 10/09/2023 1533   ALT 16 10/09/2023 1533   BILITOT 0.3 10/09/2023 1533      Lab Results  Component Value Date   TSH 5.02 10/24/2023   Lab Results  Component Value Date   WBC 7.8 10/12/2023   HGB 13.5 10/12/2023   HCT 40.5 10/12/2023   MCV 90.6 10/12/2023   PLT 193 10/12/2023     Total time spent on today's visit was 66 minutes, including both face-to-face time and nonface-to-face time.  Time included that spent on review of records (prior notes available to me/labs/imaging if pertinent), discussing treatment and goals, answering patient's questions and coordinating care.  Cc:  Pincus Sanes, MD

## 2023-10-30 NOTE — BH Specialist Note (Signed)
 Referring Provider: Kerin Salen, DO   Date of Referral:  10/28/2023 Primary Reason for Referral: New Dx ALS, Mood Location of Visit: Individual, Office Total Time: 30 minutes  Suicide/Homicide Risk: Pt denies risk Provisional Diagnosis: Psychological and Behavioral Factors associated with disease classified elsewhere, ALS      Biopsychosocial Functional Assessment      89 yo male presents with new dx of ALS. Pt presents for IBH visit following referral from treating neurologist.     LCSW provided supportive counseling as pt detailed psychosocial history, noted further below in subjective section. Pt denies prior psychiatric history. Pt reports good family support and resources. LCSW assesses for functional impairment, pt notes physical impairment. Cognitively-emotionally, pt reports acceptance of the ALS diagnosis, noting primary concerns to be his aging wife and adult children within the context of this new dx. LCSW provided brief patient education of the progressive nature of ALS as well as psychoeducation regarding cognitive-emotional functions of managing a life-limiting progressive neurological disorder.     LCSW utilized motivational interviewing  and pt worked collaboratively to Product/process development scientist for treatment based on patient interest and motivation. Pt reports openness to resources for advanced care planning however pt requests time to share new dx with family first. Pt was receptive to [facility] support today and would likely benefit from continued behavioral health sessions as needed to cognitively process health status to manage mood and disease progression.              Psychotherapy Interventions provided today in session 1. Integrated Health Assessment 2. Psychoeducation 3. Motivational Interviewing    Treatment Plan 1. Patient to follow-up with LCSW in- ~1 month 2. Medication Recommendations-deferred   Behavioral Health Treatment Recommendation(s): A. 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 Share ALS Dx with family C. 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

## 2023-11-06 ENCOUNTER — Telehealth: Payer: Self-pay | Admitting: Neurology

## 2023-11-06 NOTE — Telephone Encounter (Signed)
 Pt's wife called in and left a message with the after hours service on 11/03/23. The pt was told to bring family to tomorrow's visit. They were wondering if their son could "visit" via telephone or an app since their son is out of state?

## 2023-11-06 NOTE — Telephone Encounter (Signed)
 Called and told patients wife Dr. Allena Katz agreed to do a telephone only during the appointment as the son is out of state and we are not allowed to do virtual across state lines

## 2023-11-07 ENCOUNTER — Ambulatory Visit (INDEPENDENT_AMBULATORY_CARE_PROVIDER_SITE_OTHER): Admitting: Neurology

## 2023-11-07 ENCOUNTER — Encounter: Payer: Self-pay | Admitting: Neurology

## 2023-11-07 VITALS — BP 138/67 | HR 76 | Ht 65.0 in | Wt 133.0 lb

## 2023-11-07 DIAGNOSIS — G1221 Amyotrophic lateral sclerosis: Secondary | ICD-10-CM | POA: Diagnosis not present

## 2023-11-07 DIAGNOSIS — R0602 Shortness of breath: Secondary | ICD-10-CM | POA: Diagnosis not present

## 2023-11-07 NOTE — Progress Notes (Signed)
 [facility] Neurology Division Clinic Note - Initial Visit   Date: 11/07/2023   Roger Ingram MRN: [id number] DOB: February 28, 1929   Dear Dr. Arbutus Leas:  Thank you for your kind referral of Roger Ingram for consultation of amyotrophic lateral sclerosis. Although his history is well known to you, please allow [place] to reiterate it for the purpose of our medical record. The patient was accompanied to the clinic by wife and daughter who also provides collateral information.  Son is present via telephone.     Roger Ingram is a 89 y.o. ambidextrous male with essential tremor, hypertension, and BPH presenting for evaluation of amyotrophic lateral sclerosis.   IMPRESSION: Amyotrophic lateral sclerosis manifesting with progressive dysphagia, dysarthria, and generalized weakness.  Exam shows predominate lower motor neuron involvement with bulbar and generalized weakness and diffuse fasciculations. Pathological facial reflexes are present.  Limb reflexes and tone is normal.  NCS/EMG of the left side shows active on chronic changes involving the bulbar, cervical, and thoracic segments.   PLAN: Today's visit was primarily spent discussing the diagnosis, management, and prognosis.  Unfortunately, there is no effective treatment and medications that we have can slow the disease course, but not cure the disease.  Two FDA-approved therapies, riluzole and Radicava, were discussed.  Management is primarily supportive.   At this time, he is having orthopnea and I will try to expedite a referral to pulmonology for recommendations on non-invasive ventilation Family is also concerned about aspiration pneumonia because of fever and cough, so CXR will be ordered.  I also discussed the option of seeing a multidisciplinary ALS Clinic for clinical trials, which they expressed interest in. With his bulbar presentation, I mentioned the role of hospice and they would like to think about this.   I will see him back in 2-3  weeks to address medications and any additional questions they may have.   ------------------------------------------------------------- History of present illness: Starting around spring of 2024,  he began having difficulty with swallowing liquids, which continued to progress into the fall.  He saw GI and ENT.  Late in 2024, he began having weakness in the arms and legs as well as speech changes.  He had been seeing Dr. Arbutus Leas for tremor and has his visit in February, she has noticed dramatic change in speech and observed fasciculations as well as weakness which prompted further testing with NCS/EMG which ultimately showed active on chronic neurogenic changes involving the bulbar, cervical, and thoracic segments as seen with ALS. He also had MRI brain which showed subdural hematoma and saw neurosurgery who did not recommend any further testing.    Out-side paper records, electronic medical record, and images have been reviewed where available and summarized as:  NCS/EMG of the left side 10/26/2023: This is a complex study.  Findings show: Length-dependent sensorimotor polyneuropathy, with axonal and demyelinating features, affecting the left upper and lower extremities. Additionally, there is evidence of a widespread disorder of anterior horn cells affecting the bulbar, cervical, and thoracic segments consistent with a clinical diagnosis of motor neuron disease (amyotrophic lateral sclerosis).  MRI brain 10/12/2023: 1. The patient declined intravenous contrast administration and a non-contrast examination was performed. 2. Subdural collections (possible subdural hematomas) overlying the bilateral cerebral hemispheres, measuring up to 5 mm in thickness on the right and 3 mm in thickness on the left. A non-contrast head CT is recommended to assess for acute/subacute hemorrhage within these collections. No midline shift. 3. No evidence of an acute or recent subacute infarction.  4. Mild chronic small  vessel ischemic changes within the cerebral white matter, similar to the prior brain MRI of 05/29/2019. 5. Mild generalized cerebral atrophy.  Lab Results  Component Value Date   HGBA1C 6.0 07/17/2023   Lab Results  Component Value Date   VITAMINB12 487 10/09/2023   Lab Results  Component Value Date   TSH 5.02 10/24/2023   No results found for: "ESRSEDRATE", "POCTSEDRATE"  Past Medical History:  Diagnosis Date   Basal cell cancer    BPH (benign prostatic hypertrophy)    Dr Mena Goes   Chronic kidney disease    Hx of clear fluid sacs on both kidneys. Drained about 2006   Diverticulosis of colon    Elevated PSA    [facility]   History of kidney stones    Hx of colonic polyps    Hyperlipidemia    Hypertension    Melanoma (HCC)     Past Surgical History:  Procedure Laterality Date   basal cell     R  & L temple; Dr Terri Piedra   COLONOSCOPY W/ POLYPECTOMY  1997, 2011   Dr Kinnie Scales   melanoma     L forehead   Right rotator cuff sx     TOTAL HIP ARTHROPLASTY Left 12/19/2014   Procedure: LEFT TOTAL HIP ARTHROPLASTY ANTERIOR APPROACH;  Surgeon: Kathryne Hitch, MD;  Location: [facility];  Service: Orthopedics;  Laterality: Left;     Medications:  Outpatient Encounter Medications as of 11/07/2023  Medication Sig   amLODipine (NORVASC) 10 MG tablet Take 1 tablet (10 mg total) by mouth daily.   aspirin EC 81 MG tablet Take 81 mg by mouth daily.   cholecalciferol (VITAMIN D3) 25 MCG (1000 UNIT) tablet Take 1,000 Units by mouth daily.   finasteride (PROSCAR) 5 MG tablet Take 1 tablet by mouth once daily   folic acid (FOLVITE) 400 MCG tablet Take 400 mcg by mouth daily.   meclizine (ANTIVERT) 12.5 MG tablet Take 1-2 tablets (12.5-25 mg total) by mouth 3 (three) times daily as needed for dizziness.   propranolol (INDERAL) 10 MG tablet TAKE 1 TABLET BY MOUTH TWICE DAILY FOR ESSENTIAL TREMOR   tamsulosin (FLOMAX) 0.4 MG CAPS capsule Take 1 capsule by mouth once daily   No  facility-administered encounter medications on file as of 11/07/2023.    Allergies:  Allergies  Allergen Reactions   Hydrocodone Other (See Comments)    Double vision / diplopia   Iohexol Other (See Comments)    Syncope post IV dye for renal calculi 1963    Family History: Family History  Problem Relation Age of Onset   Colon cancer Brother 54   Other Sister        addisons disease   Dementia Sister    Stroke Brother    Stroke Mother 45   Heart attack Father 24   Stroke Father    Stroke Paternal Grandfather        in 43s   Diabetes Son        IDDM   Crohn's disease Daughter    Drug abuse Son     Social History: Social History   Tobacco Use   Smoking status: Former    Types: Cigarettes   Smokeless tobacco: Never   Tobacco comments:    For 1 month when young child  Vaping Use   Vaping status: Never Used  Substance Use Topics   Alcohol use: No    Alcohol/week: 0.0 standard drinks of alcohol  Drug use: No   Social History   Social History Narrative   Walks twice daily for exercise   Pt lives at home with spouse 1 story home- he has 3 children 2 which are living   Pt can use both hands to write   Retired Occupational hygienist    one    Vital Signs:  BP 138/67 (Cuff Size: Normal)   Pulse 76   Ht 5\' 5"  (1.651 m)   Wt 133 lb (60.3 kg)   SpO2 95%   BMI 22.13 kg/m    Neurological Exam: MENTAL STATUS including orientation to time, place, person, recent and remote memory, attention span and concentration, language, and fund of knowledge is normal.  Speech is mild-moderately dysarthric and slow.  CRANIAL NERVES: II:  No visual field defects.     III-IV-VI: Pupils equal round and reactive to light.  Normal conjugate, extra-ocular eye movements in all directions of gaze.  No nystagmus.  No ptosis.   V:  Normal facial sensation.    VII:  Normal facial symmetry.  Buccinator is 4/5 bilaterally.    VIII:  Normal hearing and vestibular function.   IX-X:  Normal palatal  movement.   XI:  Normal shoulder shrug and head rotation.   XII:  Slow side-to-side tongue movements and there is weakness 4/5.  Fasciculations are present.  MOTOR:  There is generalized loss of muscle bulk throughout.  Active fasciculations in all the limbs, more noticeable in the arms.  No pronator drift.  Neck flexion is 4/5 Upper Extremity:  Right  Left  Deltoid  4/5   4/5   Biceps  4/5   4/5   Triceps  4/5   4/5   Wrist extensors  5/5   5/5   Wrist flexors  5/5   5/5   Finger extensors  5/5   5/5   Finger flexors  5/5   5/5   Dorsal interossei  4+/5   4/5   Abductor pollicis  4/5   4/5   Tone (Ashworth scale)  0  0   Lower Extremity:  Right  Left  Hip flexors  4/5   4/5   Knee flexors  5/5   5-/5   Knee extensors  5/5   5/5   Dorsiflexors  5-/5   5-/5   Plantarflexors  5-/5   5-/5   Tone (Ashworth scale)  0  0   MSRs:                                           Right        Left brachioradialis 2+  2+  biceps 2+  2+  triceps 2+  2+  patellar 2+  2+  ankle jerk tr  tr  Hoffman no  no  plantar response down  down   SENSORY: Vibration diminished at the ankles bilaterally.   COORDINATION/GAIT: Normal finger-to- nose-finger.  Intact rapid alternating movements bilaterally.  Able to rise from a chair without using arms.  Gait narrow based and stable. Unassisted   Total time spent reviewing records, interview, history/exam, documentation, and coordination of care on day of encounter:  50 minutes   Thank you for allowing me to participate in patient's care.  If I can answer any additional questions, I would be pleased to do so.    Sincerely,    Sherwin Hollingshed K. Allena Katz,  DO

## 2023-11-08 ENCOUNTER — Ambulatory Visit
Admission: RE | Admit: 2023-11-08 | Discharge: 2023-11-08 | Disposition: A | Discharge status: Home / Self Care | Source: Ambulatory Visit | Attending: Neurology | Admitting: Neurology

## 2023-11-08 ENCOUNTER — Other Ambulatory Visit: Payer: Self-pay

## 2023-11-08 ENCOUNTER — Telehealth: Payer: Self-pay | Admitting: Neurology

## 2023-11-08 DIAGNOSIS — R0602 Shortness of breath: Secondary | ICD-10-CM

## 2023-11-08 DIAGNOSIS — G1221 Amyotrophic lateral sclerosis: Secondary | ICD-10-CM

## 2023-11-08 DIAGNOSIS — R059 Cough, unspecified: Secondary | ICD-10-CM | POA: Diagnosis not present

## 2023-11-08 NOTE — Telephone Encounter (Signed)
 Pt's wife called in stating they did the X ray today, but it was not put in as stat read so it might take a while to get the results. They are wanting to know if something could be done to get the results faster?

## 2023-11-08 NOTE — Telephone Encounter (Signed)
 Called Reading room and they will send it to [place].

## 2023-11-09 ENCOUNTER — Ambulatory Visit: Admitting: Pulmonary Disease

## 2023-11-09 ENCOUNTER — Encounter: Payer: Self-pay | Admitting: Pulmonary Disease

## 2023-11-09 ENCOUNTER — Telehealth: Payer: Self-pay | Admitting: Pulmonary Disease

## 2023-11-09 VITALS — BP 161/76 | HR 69 | Ht 65.0 in | Wt 132.6 lb

## 2023-11-09 DIAGNOSIS — R059 Cough, unspecified: Secondary | ICD-10-CM

## 2023-11-09 DIAGNOSIS — G1221 Amyotrophic lateral sclerosis: Secondary | ICD-10-CM

## 2023-11-09 DIAGNOSIS — R490 Dysphonia: Secondary | ICD-10-CM | POA: Diagnosis not present

## 2023-11-09 DIAGNOSIS — R0600 Dyspnea, unspecified: Secondary | ICD-10-CM | POA: Diagnosis not present

## 2023-11-09 NOTE — Telephone Encounter (Addendum)
  Pulmonary function study scheduled for Tuesday, April 2, at 2 PM  No smoking, no inhalers 6 hours before testing   Make sure you arrive at least 15 minutes before visit start time  If unable to make it-call [phone] to reschedule   Come in through entrance A  Use valet parking  You will get to admitting desk where they will process his information and take him over to the respiratory therapy department to have his study done

## 2023-11-09 NOTE — Telephone Encounter (Signed)
 Info to provide to daughter. I called and left voicemail. So she will call at some point   Pulmonary function study scheduled for Tuesday, April 2, at 2 PM   No smoking, no inhalers 6 hours before testing     Make sure you arrive at least 15 minutes before visit start time   If unable to make it-call [phone] to reschedule     Come in through entrance A   Use valet parking   You will get to admitting desk where they will process his information and take him over to the respiratory therapy department to have his study done

## 2023-11-09 NOTE — Patient Instructions (Addendum)
 PFT-urgent, as soon as possible   Overnight oximetry-to [facility]  DME referral for CoughAssist device-to [facility]-urgent  Mucinex-600 twice a day-it is over-the-counter, to thin out secretions  IMT/PEP device-this should help secretions and muscle training  Using incentive spirometer and Acapella/flutter device may be used in place of IMT/PEP -Aim is to help you clear secretions loose and clear it out  Sleep as upright as tolerated  Continue graded exercises as tolerated  Follow-up in about 4 weeks

## 2023-11-09 NOTE — Progress Notes (Signed)
 Roger Ingram    [id number]    07-01-1929  Primary Care Physician:Burns, Roger Mo, MD  Referring Physician: Pincus Sanes, MD [address] [place],  [place] [address]  Chief complaint:   Cough, shortness of breath  HPI:  Patient with a diagnosis of ALS, appears to be bulbar onset motor neuron disease -Dysphagia, difficulty laying flat, dysphonia  Cough, mucus production, shortness of breath  Difficulty clearing secretions  Has had issues with swallowing Has to take his time trying to swallow so I does not risk aspiration Recently had esophageal dilatation which did not seem to help significantly  Recent chest x-ray 326-reviewed by myself showing no acute infiltrate  He has lost a good bit of weight recently -Neurology note reviewed EMG October 26, 2023 consistent with motor neuron disease -Neurology did discuss palliative and hospice care  History of bilateral subdural hematomas  Recent progress notes by GI on 10/25/2023 was reviewed by Dr. Adela Ingram and that Roger Ingram  He has some air hunger when he lays down  Outpatient Encounter Medications as of 11/09/2023  Medication Sig   amLODipine (NORVASC) 10 MG tablet Take 1 tablet (10 mg total) by mouth daily.   aspirin EC 81 MG tablet Take 81 mg by mouth daily.   cholecalciferol (VITAMIN D3) 25 MCG (1000 UNIT) tablet Take 1,000 Units by mouth daily.   finasteride (PROSCAR) 5 MG tablet Take 1 tablet by mouth once daily   folic acid (FOLVITE) 400 MCG tablet Take 400 mcg by mouth daily.   meclizine (ANTIVERT) 12.5 MG tablet Take 1-2 tablets (12.5-25 mg total) by mouth 3 (three) times daily as needed for dizziness.   propranolol (INDERAL) 10 MG tablet TAKE 1 TABLET BY MOUTH TWICE DAILY FOR ESSENTIAL TREMOR   tamsulosin (FLOMAX) 0.4 MG CAPS capsule Take 1 capsule by mouth once daily   No facility-administered encounter medications on file as of 11/09/2023.    Allergies as of 11/09/2023 - Review Complete  11/09/2023  Allergen Reaction Noted   Hydrocodone Other (See Comments)    Iohexol Other (See Comments) 07/27/2004    Past Medical History:  Diagnosis Date   Basal cell cancer    BPH (benign prostatic hypertrophy)    Dr Mena Goes   Chronic kidney disease    Hx of clear fluid sacs on both kidneys. Drained about 2006   Diverticulosis of colon    Elevated PSA    [facility]   History of kidney stones    Hx of colonic polyps    Hyperlipidemia    Hypertension    Melanoma (HCC)     Past Surgical History:  Procedure Laterality Date   basal cell     R  & L temple; Dr Terri Piedra   COLONOSCOPY W/ POLYPECTOMY  1997, 2011   Dr Kinnie Scales   melanoma     L forehead   Right rotator cuff sx     TOTAL HIP ARTHROPLASTY Left 12/19/2014   Procedure: LEFT TOTAL HIP ARTHROPLASTY ANTERIOR APPROACH;  Surgeon: Kathryne Hitch, MD;  Location: [facility];  Service: Orthopedics;  Laterality: Left;    Family History  Problem Relation Age of Onset   Colon cancer Brother 29   Other Sister        addisons disease   Dementia Sister    Stroke Brother    Stroke Mother 33   Heart attack Father 37   Stroke Father    Stroke Paternal Grandfather  in 64s   Diabetes Son        IDDM   Crohn's disease Daughter    Drug abuse Son     Social History   Socioeconomic History   Marital status: Married    Spouse name: Roger Ingram   Number of children: 3   Years of education: Not on file   Highest education level: Professional school degree (e.g., MD, DDS, DVM, JD)  Occupational History   Occupation: retired    Comment: pilot  Tobacco Use   Smoking status: Former    Types: Cigarettes   Smokeless tobacco: Never   Tobacco comments:    For 1 month when young child  Vaping Use   Vaping status: Never Used  Substance and Sexual Activity   Alcohol use: No    Alcohol/week: 0.0 standard drinks of alcohol   Drug use: No   Sexual activity: Not on file  Other Topics Concern   Not on file  Social  History Narrative   Walks twice daily for exercise   Pt lives at home with spouse 1 story home- he has 3 children 2 which are living   Pt can use both hands to write   Retired Occupational hygienist    one   Social Drivers of Corporate investment banker Strain: Low Risk  (06/08/2023)   Overall Financial Resource Strain (CARDIA)    Difficulty of Paying Living Expenses: Not hard at all  Food Insecurity: No Food Insecurity (06/08/2023)   Hunger Vital Sign    Worried About Running Out of Food in the Last Year: Never true    Ran Out of Food in the Last Year: Never true  Transportation Needs: No Transportation Needs (06/08/2023)   PRAPARE - Administrator, Civil Service (Medical): No    Lack of Transportation (Non-Medical): No  Physical Activity: Insufficiently Active (06/08/2023)   Exercise Vital Sign    Days of Exercise per Week: 7 days    Minutes of Exercise per Session: 10 min  Stress: No Stress Concern Present (06/08/2023)   [employer] of Occupational Health - Occupational Stress Questionnaire    Feeling of Stress : Not at all  Social Connections: Moderately Isolated (06/08/2023)   Social Connection and Isolation Panel [NHANES]    Frequency of Communication with Friends and Family: More than three times a week    Frequency of Social Gatherings with Friends and Family: Once a week    Attends Religious Services: Never    Database administrator or Organizations: No    Attends Banker Meetings: Never    Marital Status: Married  Catering manager Violence: Not At Risk (06/08/2023)   Humiliation, Afraid, Rape, and Kick questionnaire    Fear of Current or Ex-Partner: No    Emotionally Abused: No    Physically Abused: No    Sexually Abused: No    Review of Systems  Respiratory:  Positive for cough and shortness of breath.   Psychiatric/Behavioral:  Positive for sleep disturbance.     Vitals:   11/09/23 1500  BP: (!) 161/76  Pulse: 69  SpO2: 97%     Physical  Exam Constitutional:      Appearance: Normal appearance.  HENT:     Head: Normocephalic.     Mouth/Throat:     Mouth: Mucous membranes are moist.  Eyes:     General: No scleral icterus. Cardiovascular:     Rate and Rhythm: Normal rate and regular rhythm.  Heart sounds: No murmur heard.    No friction rub.  Pulmonary:     Effort: No respiratory distress.     Breath sounds: No stridor. No wheezing or rhonchi.  Musculoskeletal:     Cervical back: No rigidity or tenderness.  Neurological:     Mental Status: He is alert.  Psychiatric:        Mood and Affect: Mood normal.     Data Reviewed: Chest x-ray 3/26 with clear lung fields-reviewed by myself  Reviewed notes by neurology-10/30/2023  Reviewed notes by GI 10/25/2023  Assessment:  Cough shortness of breath  Progressive dysphagia for which he had esophageal dilatation  Weak cough  New diagnosis of ALS  Orthopnea -Does have difficulty laying flat, coughs more when he is laying flat -Tries to sleep semiupright  Dysphonia  Plan/Recommendations:  Encouraged to sleep as upright as tolerated  Use of IMT/PEP device  Has an incentive spirometer that he started to use, using a PEP device/Acapella may help  Schedule for pulmonary function test, MIP/MEP-able to get this scheduled for Tuesday, April 2  Overnight oximetry to assess for desaturations at night  Mucinex to help thin secretions  Will stay away from sedatives with respiratory muscle dysfunction  Encouraged to continue to stay active, stated he is able to walk for about 15 minutes, used to be able to walk up to a mile  Bulbar onset of motor neuron disease -May progress to needing NIV at night if persistent symptoms of shortness of breath when he tries to lay down -Will follow-up on his PFT scheduled for Tuesday  Virl Diamond MD [facility] 11/09/2023, 3:39 PM  CC: Roger Sanes, MD

## 2023-11-10 ENCOUNTER — Telehealth: Payer: Self-pay

## 2023-11-10 NOTE — Telephone Encounter (Signed)
 Spoke with Patient's daughter Adela Lank regarding PFT   nfo to provide to daughter. I called and left voicemail. So she will call at some point     Pulmonary function study scheduled for Tuesday, April 2, at 2 PM   No smoking, no inhalers 6 hours before testing     Make sure you arrive at least 15 minutes before visit start time   If unable to make it-call [phone] to reschedule     Come in through entrance A   Use valet parking   You will get to admitting desk where they will process his information and take him over to the respiratory therapy department to have his study done  Jacqueline's voice was understanding.Nothing else further needed.

## 2023-11-13 ENCOUNTER — Telehealth: Payer: Self-pay | Admitting: Pulmonary Disease

## 2023-11-13 NOTE — Telephone Encounter (Signed)
 Dr Val Eagle, did you intend to order HST on this pt?

## 2023-11-13 NOTE — Telephone Encounter (Signed)
 Jennifer with [facility] called me this morning. Dr. Wynona Neat saw this patient on 11/09/23 and order PFT and ONO to be done. The daughter Adela Lank has gone onto  [employer] and registered her father [name] for HST. I don't see there was an order placed for HST. Dr. Wynona Neat was this talked about and just didn't get ordered

## 2023-11-15 ENCOUNTER — Ambulatory Visit (HOSPITAL_COMMUNITY)
Admission: RE | Admit: 2023-11-15 | Discharge: 2023-11-15 | Disposition: A | Discharge status: Home / Self Care | Source: Ambulatory Visit | Attending: Pulmonary Disease | Admitting: Pulmonary Disease

## 2023-11-15 ENCOUNTER — Ambulatory Visit: Payer: Self-pay

## 2023-11-15 DIAGNOSIS — H18413 Arcus senilis, bilateral: Secondary | ICD-10-CM | POA: Diagnosis not present

## 2023-11-15 DIAGNOSIS — H26491 Other secondary cataract, right eye: Secondary | ICD-10-CM | POA: Diagnosis not present

## 2023-11-15 DIAGNOSIS — Z961 Presence of intraocular lens: Secondary | ICD-10-CM | POA: Diagnosis not present

## 2023-11-15 DIAGNOSIS — H35371 Puckering of macula, right eye: Secondary | ICD-10-CM | POA: Diagnosis not present

## 2023-11-15 DIAGNOSIS — G1221 Amyotrophic lateral sclerosis: Secondary | ICD-10-CM | POA: Diagnosis not present

## 2023-11-15 LAB — PULMONARY FUNCTION TEST
FEF 25-75 Pre: 0.82 L/s
FEF2575-%Pred-Pre: 89 %
FEV1-%Pred-Pre: 79 %
FEV1-Pre: 1.38 L
FEV1FVC-%Pred-Pre: 91 %
FEV6-%Pred-Pre: 90 %
FEV6-Pre: 2.18 L
FEV6FVC-%Pred-Pre: 110 %
FVC-%Pred-Pre: 81 %
FVC-Pre: 2.18 L
Pre FEV1/FVC ratio: 63 %
Pre FEV6/FVC Ratio: 100 %

## 2023-11-15 NOTE — Telephone Encounter (Signed)
 Chief Complaint: SOB Symptoms: SOB, inability to clear secretions Frequency: Ongoing, pt with ALS Disposition: [] ED /[] Urgent Care (no appt availability in office) / [x] Appointment(In office/virtual)/ []  [facility]/ [] Home Care/ [x] Refused Recommended Disposition /[] [facility]/ []  Follow-up with PCP Additional Notes: Pt's daughter reports pt was recently seen in office and a cough assist was supposed to be ordered STAT through [facility], they do not have orders yet and pt's daughter notes this is urgent. She advised pt is currently having PFT but is having increasingly worsening episodes of SOB and has "passed out at times" due to thickening secretions and his inability to clear them. Pt's daughter is requesting orders be sent to [facility] ASAP. This RN educated pt on home care, new-worsening symptoms, when to call back/seek emergent care. Pt verbalized understanding and agrees to plan.    Copied from CRM [id number]. Topic: Clinical - Red Word Triage >> Nov 15, 2023  2:01 PM Payton Doughty wrote: Red Word that prompted transfer to Nurse Triage: SOB/can't breathe, can't cough up anything. It is very thick, can't get it up or down. Reason for Disposition  [1] Longstanding difficulty breathing (e.g., CHF, COPD, emphysema) AND [2] WORSE than normal  Answer Assessment - Initial Assessment Questions 1. RESPIRATORY STATUS: "Describe your breathing?" (e.g., wheezing, shortness of breath, unable to speak, severe coughing)      Can't catch his breath 2. ONSET: "When did this breathing problem begin?"      Ongoing 3. PATTERN "Does the difficult breathing come and go, or has it been constant since it started?"      Constant 4. SEVERITY: "How bad is your breathing?" (e.g., mild, moderate, severe)    - MILD: No SOB at rest, mild SOB with walking, speaks normally in sentences, can lie down, no retractions, pulse < 100.    - MODERATE: SOB at rest, SOB with minimal exertion and prefers to sit,  cannot lie down flat, speaks in phrases, mild retractions, audible wheezing, pulse 100-120.    - SEVERE: Very SOB at rest, speaks in single words, struggling to breathe, sitting hunched forward, retractions, pulse > 120      Can be severe at times "he has passed out" 5. RECURRENT SYMPTOM: "Have you had difficulty breathing before?" If Yes, ask: "When was the last time?" and "What happened that time?"      Yes  8. CAUSE: "What do you think is causing the breathing problem?"      Pt has ALS, cough assist was supposed to be ordered at last visit  Protocols used: Breathing Difficulty-A-AH

## 2023-11-16 ENCOUNTER — Telehealth: Payer: Self-pay | Admitting: Pulmonary Disease

## 2023-11-16 ENCOUNTER — Other Ambulatory Visit: Payer: Self-pay

## 2023-11-16 DIAGNOSIS — G1221 Amyotrophic lateral sclerosis: Secondary | ICD-10-CM

## 2023-11-16 NOTE — Telephone Encounter (Signed)
Dr. Olalere please advise 

## 2023-11-16 NOTE — Telephone Encounter (Signed)
 ONO order was placed on 11/09/23 and sent to [facility]. I sent urgent message asking them to check this issue

## 2023-11-16 NOTE — Telephone Encounter (Signed)
 I received a message from [place] with [facility] [place], Stacy   I  sent this to the local office to process on 3/28, I just sent and email to them asking them to contact the patients daughter with an update.  I apologize for the delay.

## 2023-11-16 NOTE — Telephone Encounter (Signed)
 No  Just need overnight oximetry.  Don't need a sleep study

## 2023-11-16 NOTE — Telephone Encounter (Signed)
 Spoke with [name] at [facility] regarding patient . [name] advised me he can meet DR.Olalere at the hospital tomorrow to have him sign a form for patient to get his NIV tomorrow . OK per Dr.Olalere to meet [name];ey tomorrow   Nothing else further needed.

## 2023-11-16 NOTE — Telephone Encounter (Signed)
 Order has been placed.Spoke with patient's wife Claris Gower . Nothing else further needed.

## 2023-11-16 NOTE — Telephone Encounter (Signed)
 Updated patient's daughter about findings on pulmonary function test  He does show respiratory muscle weakness  Will benefit from NIV  Patient still has not received a CoughAssist device Continues to struggle with his breathing   Will place order for NIV to go to [facility] Will follow-up on CoughAssist device order to [facility]

## 2023-11-16 NOTE — Telephone Encounter (Signed)
 I sent this message to Kendal Hymen this AM, just got a message from Roger Ingram  Just making sure we move forward on this  Please make sure [facility] has an order for a CoughAssist device   -Daughter stated that as of yesterday [facility] did not have an order   Based on Roger Ingram's pulmonary function test -He has significant respiratory muscle weakness -Will benefit from noninvasive ventilator     -Order to [facility] for noninvasive ventilator -Diagnosis is neuromuscular weakness from ALS -BiPAP mode -IPAP of 15 -EPAP of 5 -Backup rate of 12-16 breaths/min -Tidal volume goal of about 500 cc -Fullface mask -With heated humidification   -Pulmonary function test was performed 11/16/2023 -Maximum expiratory pressure of 31 -Minimum inspiratory pressure of -36

## 2023-11-16 NOTE — Telephone Encounter (Signed)
 Order has been placed and patient's wife  and daughter Adela Lank both have been notified regarding order.   Nothing else further needed.

## 2023-11-16 NOTE — Telephone Encounter (Signed)
 Please make sure [facility] has an order for a CoughAssist device  -Daughter stated that as of yesterday [facility] did not have an order  Based on patient's pulmonary function test -He has significant respiratory muscle weakness -Will benefit from noninvasive ventilator   -Order to [facility] for noninvasive ventilator -Diagnosis is neuromuscular weakness from ALS -BiPAP mode -IPAP of 15 -EPAP of 5 -Backup rate of 12-16 breaths/min -Tidal volume goal of about 500 cc -Fullface mask -With heated humidification  -Pulmonary function test was performed 11/16/2023 -Maximum expiratory pressure of 31 -Minimum inspiratory pressure of -36

## 2023-11-21 DIAGNOSIS — G1221 Amyotrophic lateral sclerosis: Secondary | ICD-10-CM | POA: Diagnosis not present

## 2023-11-22 ENCOUNTER — Telehealth: Payer: Self-pay | Admitting: Pulmonary Disease

## 2023-11-22 NOTE — Telephone Encounter (Signed)
 Copied from CRM [id number]. Topic: Clinical - Request for Lab/Test Order >> Nov 22, 2023 10:35 AM Renie Ora wrote: Reason for CRM: Ashly with [facility] called regarding the night time oxygen needs for the patient. Ashly stated he can be contacted at [phone].  Spoke with Ashly with [facility] [phone] regarding patient's ono that needs to be faxed back to [facility] [phone]. Had Dr.Olalere sign the bottom of the ONO results. Will fax back .  Nothing else further needed.

## 2023-11-22 NOTE — Telephone Encounter (Signed)
 Overnight oximetry reviewed showing desaturations with a nadir of 78%  The desaturations as a periodicity that may be seen with REM sleep or when he is in deep sleep  He has a recent prescription for an NIV

## 2023-11-23 ENCOUNTER — Ambulatory Visit: Payer: Self-pay | Admitting: Clinical

## 2023-11-28 ENCOUNTER — Ambulatory Visit: Admitting: Neurology

## 2023-11-30 ENCOUNTER — Ambulatory Visit (INDEPENDENT_AMBULATORY_CARE_PROVIDER_SITE_OTHER): Payer: Medicare Other | Admitting: Otolaryngology

## 2023-12-07 DIAGNOSIS — G1221 Amyotrophic lateral sclerosis: Secondary | ICD-10-CM | POA: Diagnosis not present

## 2023-12-13 ENCOUNTER — Other Ambulatory Visit: Payer: Self-pay | Admitting: Internal Medicine

## 2023-12-30 ENCOUNTER — Other Ambulatory Visit: Payer: Self-pay | Admitting: Internal Medicine

## 2024-01-03 DIAGNOSIS — R1310 Dysphagia, unspecified: Secondary | ICD-10-CM | POA: Diagnosis not present

## 2024-01-03 DIAGNOSIS — R471 Dysarthria and anarthria: Secondary | ICD-10-CM | POA: Diagnosis not present

## 2024-01-03 DIAGNOSIS — G1221 Amyotrophic lateral sclerosis: Secondary | ICD-10-CM | POA: Diagnosis not present

## 2024-01-14 ENCOUNTER — Encounter: Payer: Self-pay | Admitting: Internal Medicine

## 2024-01-14 NOTE — Progress Notes (Unsigned)
 Subjective:    Patient ID: Roger Ingram, male    DOB: 11/06/1928, 89 y.o.   MRN: [id number]     HPI Blade is here for follow up of his chronic medical problems.  Has been diagnosed with ALS since he was here last.  He is not following with neurology in [facility].    Medications and allergies reviewed with patient and updated if appropriate.  Current Outpatient Medications on File Prior to Visit  Medication Sig Dispense Refill   amLODipine  (NORVASC ) 10 MG tablet Take 1 tablet (10 mg total) by mouth daily. 90 tablet    aspirin  EC 81 MG tablet Take 81 mg by mouth daily.     atropine 1 % ophthalmic solution Take 1-2 drops by mouth.     cholecalciferol (VITAMIN D3) 25 MCG (1000 UNIT) tablet Take 1,000 Units by mouth daily.     finasteride  (PROSCAR ) 5 MG tablet Take 1 tablet by mouth once daily 90 tablet 0   folic acid  (FOLVITE ) 400 MCG tablet Take 400 mcg by mouth daily.     meclizine  (ANTIVERT ) 12.5 MG tablet Take 1-2 tablets (12.5-25 mg total) by mouth 3 (three) times daily as needed for dizziness. 40 tablet 1   prednisoLONE acetate (PRED FORTE) 1 % ophthalmic suspension INSTILL 1 DROP INTO AFFECTED EYE 4 TIMES DAILY FOR 5 DAYS THEN STOP     propranolol  (INDERAL ) 10 MG tablet TAKE 1 TABLET BY MOUTH TWICE DAILY FOR ESSENTIAL TREMOR 180 tablet 0   tamsulosin  (FLOMAX ) 0.4 MG CAPS capsule Take 1 capsule by mouth once daily 90 capsule 0   No current facility-administered medications on file prior to visit.     Review of Systems  Constitutional:  Negative for fever.  HENT:  Positive for trouble swallowing.   Respiratory:  Positive for cough, shortness of breath and wheezing.   Cardiovascular:  Negative for chest pain, palpitations and leg swelling.  Neurological:  Positive for speech difficulty, weakness (left sided weakness) and light-headedness (occ). Negative for headaches.       Objective:   Vitals:   01/15/24 1400  BP: (!) 140/70  Pulse: 71  Temp: 98.1 F (36.7 C)   SpO2: 92%   BP Readings from Last 3 Encounters:  01/15/24 (!) 140/70  11/09/23 (!) 161/76  11/07/23 138/67   Wt Readings from Last 3 Encounters:  01/15/24 124 lb (56.2 kg)  11/09/23 132 lb 9.6 oz (60.1 kg)  11/07/23 133 lb (60.3 kg)   Body mass index is 20.63 kg/m.    Physical Exam Constitutional:      General: He is not in acute distress.    Appearance: Normal appearance. He is not ill-appearing.  HENT:     Head: Normocephalic and atraumatic.  Eyes:     Conjunctiva/sclera: Conjunctivae normal.  Cardiovascular:     Rate and Rhythm: Normal rate and regular rhythm.     Heart sounds: Normal heart sounds.  Pulmonary:     Effort: Pulmonary effort is normal. No respiratory distress.     Breath sounds: Normal breath sounds. No wheezing or rales.  Musculoskeletal:     Right lower leg: No edema.     Left lower leg: No edema.  Skin:    General: Skin is warm and dry.     Findings: No rash.  Neurological:     Mental Status: He is alert. Mental status is at baseline.     Comments: Mild dysarthria  Psychiatric:  Mood and Affect: Mood normal.        Lab Results  Component Value Date   WBC 7.8 10/12/2023   HGB 13.5 10/12/2023   HCT 40.5 10/12/2023   PLT 193 10/12/2023   GLUCOSE 98 10/12/2023   CHOL 216 (H) 07/17/2023   TRIG 69.0 07/17/2023   HDL 69.80 07/17/2023   LDLDIRECT 115.6 03/05/2013   LDLCALC 132 (H) 07/17/2023   ALT 16 10/09/2023   AST 19 10/09/2023   NA 138 10/12/2023   K 4.3 10/12/2023   CL 107 10/12/2023   CREATININE 1.20 10/12/2023   BUN 25 (H) 10/12/2023   CO2 25 10/12/2023   TSH 5.02 10/24/2023   PSA 1.35 08/01/2016   INR 1.0 10/12/2023   HGBA1C 6.0 07/17/2023     Assessment & Plan:    See Problem List for Assessment and Plan of chronic medical problems.

## 2024-01-14 NOTE — Patient Instructions (Addendum)
      Blood work was ordered.       Medications changes include :   None    A referral was ordered and someone will call you to schedule an appointment.     Return in about 6 months (around 07/16/2024) for follow up.

## 2024-01-15 ENCOUNTER — Ambulatory Visit (INDEPENDENT_AMBULATORY_CARE_PROVIDER_SITE_OTHER): Payer: Medicare Other | Admitting: Internal Medicine

## 2024-01-15 VITALS — BP 140/70 | HR 71 | Temp 98.1°F | Ht 65.0 in | Wt 124.0 lb

## 2024-01-15 DIAGNOSIS — R7303 Prediabetes: Secondary | ICD-10-CM

## 2024-01-15 DIAGNOSIS — N1831 Chronic kidney disease, stage 3a: Secondary | ICD-10-CM | POA: Diagnosis not present

## 2024-01-15 DIAGNOSIS — G250 Essential tremor: Secondary | ICD-10-CM | POA: Diagnosis not present

## 2024-01-15 DIAGNOSIS — G1221 Amyotrophic lateral sclerosis: Secondary | ICD-10-CM

## 2024-01-15 DIAGNOSIS — R350 Frequency of micturition: Secondary | ICD-10-CM

## 2024-01-15 DIAGNOSIS — N401 Enlarged prostate with lower urinary tract symptoms: Secondary | ICD-10-CM

## 2024-01-15 DIAGNOSIS — E7849 Other hyperlipidemia: Secondary | ICD-10-CM

## 2024-01-15 DIAGNOSIS — I10 Essential (primary) hypertension: Secondary | ICD-10-CM | POA: Diagnosis not present

## 2024-01-15 NOTE — Assessment & Plan Note (Signed)
 Chronic controlled Continue Proscar 5 mg daily, Flomax 0.4 mg daily

## 2024-01-15 NOTE — Assessment & Plan Note (Signed)
 Chronic Lipids have been for age Continue lifestyle control

## 2024-01-15 NOTE — Assessment & Plan Note (Addendum)
 New diagnosis since I saw him last Following with neurology at [facility]  - ALS clinic Mild-moderate speech deficits, moderate swallowing deficits, left sided weakness Likely to get a PEG

## 2024-01-15 NOTE — Assessment & Plan Note (Signed)
 Chronic  Lab Results  Component Value Date   HGBA1C 6.0 07/17/2023

## 2024-01-15 NOTE — Assessment & Plan Note (Signed)
 Chronic On propranolol  10 mg twice daily No longer taking primidone 

## 2024-01-15 NOTE — Assessment & Plan Note (Signed)
Chronic Blood pressure well controlled Continue amlodipine 10 mg daily, propranolol 10 mg twice daily which is mostly for his tremor

## 2024-01-15 NOTE — Assessment & Plan Note (Signed)
Chronic Following with nephrology

## 2024-02-11 DIAGNOSIS — C439 Malignant melanoma of skin, unspecified: Secondary | ICD-10-CM | POA: Diagnosis not present

## 2024-02-11 DIAGNOSIS — N400 Enlarged prostate without lower urinary tract symptoms: Secondary | ICD-10-CM | POA: Diagnosis not present

## 2024-02-11 DIAGNOSIS — S065X0A Traumatic subdural hemorrhage without loss of consciousness, initial encounter: Secondary | ICD-10-CM | POA: Diagnosis not present

## 2024-02-11 DIAGNOSIS — N183 Chronic kidney disease, stage 3 unspecified: Secondary | ICD-10-CM | POA: Diagnosis not present

## 2024-02-11 DIAGNOSIS — R1310 Dysphagia, unspecified: Secondary | ICD-10-CM | POA: Diagnosis not present

## 2024-02-11 DIAGNOSIS — G250 Essential tremor: Secondary | ICD-10-CM | POA: Diagnosis not present

## 2024-02-11 DIAGNOSIS — E785 Hyperlipidemia, unspecified: Secondary | ICD-10-CM | POA: Diagnosis not present

## 2024-02-11 DIAGNOSIS — I10 Essential (primary) hypertension: Secondary | ICD-10-CM | POA: Diagnosis not present

## 2024-02-11 DIAGNOSIS — G1221 Amyotrophic lateral sclerosis: Secondary | ICD-10-CM | POA: Diagnosis not present

## 2024-02-12 DIAGNOSIS — R1310 Dysphagia, unspecified: Secondary | ICD-10-CM | POA: Diagnosis not present

## 2024-02-12 DIAGNOSIS — I10 Essential (primary) hypertension: Secondary | ICD-10-CM | POA: Diagnosis not present

## 2024-02-12 DIAGNOSIS — G1221 Amyotrophic lateral sclerosis: Secondary | ICD-10-CM | POA: Diagnosis not present

## 2024-02-12 DIAGNOSIS — N400 Enlarged prostate without lower urinary tract symptoms: Secondary | ICD-10-CM | POA: Diagnosis not present

## 2024-02-12 DIAGNOSIS — E785 Hyperlipidemia, unspecified: Secondary | ICD-10-CM | POA: Diagnosis not present

## 2024-02-12 DIAGNOSIS — N183 Chronic kidney disease, stage 3 unspecified: Secondary | ICD-10-CM | POA: Diagnosis not present

## 2024-02-13 DIAGNOSIS — G250 Essential tremor: Secondary | ICD-10-CM | POA: Diagnosis not present

## 2024-02-13 DIAGNOSIS — N183 Chronic kidney disease, stage 3 unspecified: Secondary | ICD-10-CM | POA: Diagnosis not present

## 2024-02-13 DIAGNOSIS — C439 Malignant melanoma of skin, unspecified: Secondary | ICD-10-CM | POA: Diagnosis not present

## 2024-02-13 DIAGNOSIS — R1310 Dysphagia, unspecified: Secondary | ICD-10-CM | POA: Diagnosis not present

## 2024-02-13 DIAGNOSIS — G1221 Amyotrophic lateral sclerosis: Secondary | ICD-10-CM | POA: Diagnosis not present

## 2024-02-13 DIAGNOSIS — N400 Enlarged prostate without lower urinary tract symptoms: Secondary | ICD-10-CM | POA: Diagnosis not present

## 2024-02-13 DIAGNOSIS — S065X0A Traumatic subdural hemorrhage without loss of consciousness, initial encounter: Secondary | ICD-10-CM | POA: Diagnosis not present

## 2024-02-13 DIAGNOSIS — E785 Hyperlipidemia, unspecified: Secondary | ICD-10-CM | POA: Diagnosis not present

## 2024-02-13 DIAGNOSIS — I10 Essential (primary) hypertension: Secondary | ICD-10-CM | POA: Diagnosis not present

## 2024-02-14 DIAGNOSIS — N183 Chronic kidney disease, stage 3 unspecified: Secondary | ICD-10-CM | POA: Diagnosis not present

## 2024-02-14 DIAGNOSIS — E785 Hyperlipidemia, unspecified: Secondary | ICD-10-CM | POA: Diagnosis not present

## 2024-02-14 DIAGNOSIS — I10 Essential (primary) hypertension: Secondary | ICD-10-CM | POA: Diagnosis not present

## 2024-02-14 DIAGNOSIS — G1221 Amyotrophic lateral sclerosis: Secondary | ICD-10-CM | POA: Diagnosis not present

## 2024-02-14 DIAGNOSIS — N400 Enlarged prostate without lower urinary tract symptoms: Secondary | ICD-10-CM | POA: Diagnosis not present

## 2024-02-14 DIAGNOSIS — R1310 Dysphagia, unspecified: Secondary | ICD-10-CM | POA: Diagnosis not present

## 2024-02-19 DIAGNOSIS — G1221 Amyotrophic lateral sclerosis: Secondary | ICD-10-CM | POA: Diagnosis not present

## 2024-02-19 DIAGNOSIS — E785 Hyperlipidemia, unspecified: Secondary | ICD-10-CM | POA: Diagnosis not present

## 2024-02-19 DIAGNOSIS — R1310 Dysphagia, unspecified: Secondary | ICD-10-CM | POA: Diagnosis not present

## 2024-02-19 DIAGNOSIS — N400 Enlarged prostate without lower urinary tract symptoms: Secondary | ICD-10-CM | POA: Diagnosis not present

## 2024-02-19 DIAGNOSIS — N183 Chronic kidney disease, stage 3 unspecified: Secondary | ICD-10-CM | POA: Diagnosis not present

## 2024-02-19 DIAGNOSIS — I10 Essential (primary) hypertension: Secondary | ICD-10-CM | POA: Diagnosis not present

## 2024-02-26 DIAGNOSIS — N400 Enlarged prostate without lower urinary tract symptoms: Secondary | ICD-10-CM | POA: Diagnosis not present

## 2024-02-26 DIAGNOSIS — E785 Hyperlipidemia, unspecified: Secondary | ICD-10-CM | POA: Diagnosis not present

## 2024-02-26 DIAGNOSIS — I10 Essential (primary) hypertension: Secondary | ICD-10-CM | POA: Diagnosis not present

## 2024-02-26 DIAGNOSIS — N183 Chronic kidney disease, stage 3 unspecified: Secondary | ICD-10-CM | POA: Diagnosis not present

## 2024-02-26 DIAGNOSIS — G1221 Amyotrophic lateral sclerosis: Secondary | ICD-10-CM | POA: Diagnosis not present

## 2024-02-26 DIAGNOSIS — R1310 Dysphagia, unspecified: Secondary | ICD-10-CM | POA: Diagnosis not present

## 2024-02-28 ENCOUNTER — Other Ambulatory Visit: Payer: Self-pay | Admitting: Internal Medicine

## 2024-02-28 DIAGNOSIS — E785 Hyperlipidemia, unspecified: Secondary | ICD-10-CM | POA: Diagnosis not present

## 2024-02-28 DIAGNOSIS — N183 Chronic kidney disease, stage 3 unspecified: Secondary | ICD-10-CM | POA: Diagnosis not present

## 2024-02-28 DIAGNOSIS — G1221 Amyotrophic lateral sclerosis: Secondary | ICD-10-CM | POA: Diagnosis not present

## 2024-02-28 DIAGNOSIS — N400 Enlarged prostate without lower urinary tract symptoms: Secondary | ICD-10-CM | POA: Diagnosis not present

## 2024-02-28 DIAGNOSIS — R1310 Dysphagia, unspecified: Secondary | ICD-10-CM | POA: Diagnosis not present

## 2024-02-28 DIAGNOSIS — I10 Essential (primary) hypertension: Secondary | ICD-10-CM | POA: Diagnosis not present

## 2024-03-04 DIAGNOSIS — I10 Essential (primary) hypertension: Secondary | ICD-10-CM | POA: Diagnosis not present

## 2024-03-04 DIAGNOSIS — G1221 Amyotrophic lateral sclerosis: Secondary | ICD-10-CM | POA: Diagnosis not present

## 2024-03-04 DIAGNOSIS — N183 Chronic kidney disease, stage 3 unspecified: Secondary | ICD-10-CM | POA: Diagnosis not present

## 2024-03-04 DIAGNOSIS — N400 Enlarged prostate without lower urinary tract symptoms: Secondary | ICD-10-CM | POA: Diagnosis not present

## 2024-03-04 DIAGNOSIS — R1310 Dysphagia, unspecified: Secondary | ICD-10-CM | POA: Diagnosis not present

## 2024-03-04 DIAGNOSIS — E785 Hyperlipidemia, unspecified: Secondary | ICD-10-CM | POA: Diagnosis not present

## 2024-03-12 DIAGNOSIS — I10 Essential (primary) hypertension: Secondary | ICD-10-CM | POA: Diagnosis not present

## 2024-03-12 DIAGNOSIS — G1221 Amyotrophic lateral sclerosis: Secondary | ICD-10-CM | POA: Diagnosis not present

## 2024-03-12 DIAGNOSIS — R1310 Dysphagia, unspecified: Secondary | ICD-10-CM | POA: Diagnosis not present

## 2024-03-12 DIAGNOSIS — N400 Enlarged prostate without lower urinary tract symptoms: Secondary | ICD-10-CM | POA: Diagnosis not present

## 2024-03-12 DIAGNOSIS — E785 Hyperlipidemia, unspecified: Secondary | ICD-10-CM | POA: Diagnosis not present

## 2024-03-12 DIAGNOSIS — N183 Chronic kidney disease, stage 3 unspecified: Secondary | ICD-10-CM | POA: Diagnosis not present

## 2024-03-15 DIAGNOSIS — R1310 Dysphagia, unspecified: Secondary | ICD-10-CM | POA: Diagnosis not present

## 2024-03-15 DIAGNOSIS — N183 Chronic kidney disease, stage 3 unspecified: Secondary | ICD-10-CM | POA: Diagnosis not present

## 2024-03-15 DIAGNOSIS — N400 Enlarged prostate without lower urinary tract symptoms: Secondary | ICD-10-CM | POA: Diagnosis not present

## 2024-03-15 DIAGNOSIS — G1221 Amyotrophic lateral sclerosis: Secondary | ICD-10-CM | POA: Diagnosis not present

## 2024-03-15 DIAGNOSIS — I10 Essential (primary) hypertension: Secondary | ICD-10-CM | POA: Diagnosis not present

## 2024-03-15 DIAGNOSIS — C439 Malignant melanoma of skin, unspecified: Secondary | ICD-10-CM | POA: Diagnosis not present

## 2024-03-15 DIAGNOSIS — E785 Hyperlipidemia, unspecified: Secondary | ICD-10-CM | POA: Diagnosis not present

## 2024-03-15 DIAGNOSIS — G250 Essential tremor: Secondary | ICD-10-CM | POA: Diagnosis not present

## 2024-03-15 DIAGNOSIS — S065X0A Traumatic subdural hemorrhage without loss of consciousness, initial encounter: Secondary | ICD-10-CM | POA: Diagnosis not present

## 2024-03-19 DIAGNOSIS — G1221 Amyotrophic lateral sclerosis: Secondary | ICD-10-CM | POA: Diagnosis not present

## 2024-03-19 DIAGNOSIS — E785 Hyperlipidemia, unspecified: Secondary | ICD-10-CM | POA: Diagnosis not present

## 2024-03-19 DIAGNOSIS — N400 Enlarged prostate without lower urinary tract symptoms: Secondary | ICD-10-CM | POA: Diagnosis not present

## 2024-03-19 DIAGNOSIS — N183 Chronic kidney disease, stage 3 unspecified: Secondary | ICD-10-CM | POA: Diagnosis not present

## 2024-03-19 DIAGNOSIS — I10 Essential (primary) hypertension: Secondary | ICD-10-CM | POA: Diagnosis not present

## 2024-03-19 DIAGNOSIS — R1310 Dysphagia, unspecified: Secondary | ICD-10-CM | POA: Diagnosis not present

## 2024-03-26 ENCOUNTER — Other Ambulatory Visit: Payer: Self-pay | Admitting: Internal Medicine

## 2024-03-26 DIAGNOSIS — N400 Enlarged prostate without lower urinary tract symptoms: Secondary | ICD-10-CM | POA: Diagnosis not present

## 2024-03-26 DIAGNOSIS — N183 Chronic kidney disease, stage 3 unspecified: Secondary | ICD-10-CM | POA: Diagnosis not present

## 2024-03-26 DIAGNOSIS — I10 Essential (primary) hypertension: Secondary | ICD-10-CM | POA: Diagnosis not present

## 2024-03-26 DIAGNOSIS — R1310 Dysphagia, unspecified: Secondary | ICD-10-CM | POA: Diagnosis not present

## 2024-03-26 DIAGNOSIS — G1221 Amyotrophic lateral sclerosis: Secondary | ICD-10-CM | POA: Diagnosis not present

## 2024-03-26 DIAGNOSIS — E785 Hyperlipidemia, unspecified: Secondary | ICD-10-CM | POA: Diagnosis not present

## 2024-03-28 ENCOUNTER — Other Ambulatory Visit: Payer: Self-pay | Admitting: Internal Medicine

## 2024-04-02 DIAGNOSIS — N183 Chronic kidney disease, stage 3 unspecified: Secondary | ICD-10-CM | POA: Diagnosis not present

## 2024-04-02 DIAGNOSIS — N400 Enlarged prostate without lower urinary tract symptoms: Secondary | ICD-10-CM | POA: Diagnosis not present

## 2024-04-02 DIAGNOSIS — I10 Essential (primary) hypertension: Secondary | ICD-10-CM | POA: Diagnosis not present

## 2024-04-02 DIAGNOSIS — G1221 Amyotrophic lateral sclerosis: Secondary | ICD-10-CM | POA: Diagnosis not present

## 2024-04-02 DIAGNOSIS — E785 Hyperlipidemia, unspecified: Secondary | ICD-10-CM | POA: Diagnosis not present

## 2024-04-02 DIAGNOSIS — R1310 Dysphagia, unspecified: Secondary | ICD-10-CM | POA: Diagnosis not present

## 2024-04-03 DIAGNOSIS — G250 Essential tremor: Secondary | ICD-10-CM | POA: Diagnosis not present

## 2024-04-03 DIAGNOSIS — G1221 Amyotrophic lateral sclerosis: Secondary | ICD-10-CM | POA: Diagnosis not present

## 2024-04-03 DIAGNOSIS — R296 Repeated falls: Secondary | ICD-10-CM | POA: Diagnosis not present

## 2024-04-03 DIAGNOSIS — R1312 Dysphagia, oropharyngeal phase: Secondary | ICD-10-CM | POA: Diagnosis not present

## 2024-04-03 DIAGNOSIS — R471 Dysarthria and anarthria: Secondary | ICD-10-CM | POA: Diagnosis not present

## 2024-04-03 DIAGNOSIS — R1310 Dysphagia, unspecified: Secondary | ICD-10-CM | POA: Diagnosis not present

## 2024-04-09 DIAGNOSIS — I10 Essential (primary) hypertension: Secondary | ICD-10-CM | POA: Diagnosis not present

## 2024-04-09 DIAGNOSIS — E785 Hyperlipidemia, unspecified: Secondary | ICD-10-CM | POA: Diagnosis not present

## 2024-04-09 DIAGNOSIS — G1221 Amyotrophic lateral sclerosis: Secondary | ICD-10-CM | POA: Diagnosis not present

## 2024-04-09 DIAGNOSIS — R1310 Dysphagia, unspecified: Secondary | ICD-10-CM | POA: Diagnosis not present

## 2024-04-09 DIAGNOSIS — N183 Chronic kidney disease, stage 3 unspecified: Secondary | ICD-10-CM | POA: Diagnosis not present

## 2024-04-09 DIAGNOSIS — N400 Enlarged prostate without lower urinary tract symptoms: Secondary | ICD-10-CM | POA: Diagnosis not present

## 2024-04-11 DIAGNOSIS — N400 Enlarged prostate without lower urinary tract symptoms: Secondary | ICD-10-CM | POA: Diagnosis not present

## 2024-04-11 DIAGNOSIS — R1310 Dysphagia, unspecified: Secondary | ICD-10-CM | POA: Diagnosis not present

## 2024-04-11 DIAGNOSIS — N183 Chronic kidney disease, stage 3 unspecified: Secondary | ICD-10-CM | POA: Diagnosis not present

## 2024-04-11 DIAGNOSIS — G1221 Amyotrophic lateral sclerosis: Secondary | ICD-10-CM | POA: Diagnosis not present

## 2024-04-11 DIAGNOSIS — I10 Essential (primary) hypertension: Secondary | ICD-10-CM | POA: Diagnosis not present

## 2024-04-11 DIAGNOSIS — E785 Hyperlipidemia, unspecified: Secondary | ICD-10-CM | POA: Diagnosis not present

## 2024-06-06 DIAGNOSIS — I469 Cardiac arrest, cause unspecified: Secondary | ICD-10-CM | POA: Diagnosis not present

## 2024-06-06 DIAGNOSIS — I499 Cardiac arrhythmia, unspecified: Secondary | ICD-10-CM | POA: Diagnosis not present

## 2024-06-15 DEATH — deceased
